# Patient Record
Sex: Female | Born: 1950 | Race: White | Hispanic: No | Marital: Married | Smoking: Former smoker
Health system: Southern US, Community
[De-identification: ages and names within clinical notes are randomized; demographics above are authoritative.]

## PROBLEM LIST (undated history)

## (undated) DIAGNOSIS — G8929 Other chronic pain: Secondary | ICD-10-CM

## (undated) DIAGNOSIS — T451X5A Adverse effect of antineoplastic and immunosuppressive drugs, initial encounter: Secondary | ICD-10-CM

## (undated) DIAGNOSIS — H9192 Unspecified hearing loss, left ear: Secondary | ICD-10-CM

## (undated) DIAGNOSIS — C50912 Malignant neoplasm of unspecified site of left female breast: Secondary | ICD-10-CM

## (undated) DIAGNOSIS — Z9889 Other specified postprocedural states: Secondary | ICD-10-CM

## (undated) DIAGNOSIS — M545 Low back pain, unspecified: Secondary | ICD-10-CM

## (undated) DIAGNOSIS — R29810 Facial weakness: Secondary | ICD-10-CM

## (undated) DIAGNOSIS — Z9221 Personal history of antineoplastic chemotherapy: Secondary | ICD-10-CM

## (undated) DIAGNOSIS — F419 Anxiety disorder, unspecified: Secondary | ICD-10-CM

## (undated) DIAGNOSIS — M79672 Pain in left foot: Secondary | ICD-10-CM

## (undated) DIAGNOSIS — L658 Other specified nonscarring hair loss: Secondary | ICD-10-CM

## (undated) DIAGNOSIS — I10 Essential (primary) hypertension: Secondary | ICD-10-CM

## (undated) DIAGNOSIS — Z8719 Personal history of other diseases of the digestive system: Secondary | ICD-10-CM

## (undated) DIAGNOSIS — R112 Nausea with vomiting, unspecified: Secondary | ICD-10-CM

## (undated) DIAGNOSIS — D320 Benign neoplasm of cerebral meninges: Secondary | ICD-10-CM

## (undated) DIAGNOSIS — T7840XA Allergy, unspecified, initial encounter: Secondary | ICD-10-CM

## (undated) DIAGNOSIS — K219 Gastro-esophageal reflux disease without esophagitis: Secondary | ICD-10-CM

## (undated) DIAGNOSIS — N393 Stress incontinence (female) (male): Secondary | ICD-10-CM

## (undated) DIAGNOSIS — G43909 Migraine, unspecified, not intractable, without status migrainosus: Secondary | ICD-10-CM

## (undated) HISTORY — PX: UPPER GASTROINTESTINAL ENDOSCOPY: SHX188

## (undated) HISTORY — PX: FOOT FRACTURE SURGERY: SHX645

## (undated) HISTORY — PX: COLONOSCOPY: SHX174

## (undated) HISTORY — PX: PARTIAL KNEE ARTHROPLASTY: SHX2174

## (undated) HISTORY — PX: WISDOM TOOTH EXTRACTION: SHX21

## (undated) HISTORY — PX: MULTIPLE TOOTH EXTRACTIONS: SHX2053

## (undated) HISTORY — DX: Benign neoplasm of cerebral meninges: D32.0

## (undated) HISTORY — PX: FOOT SURGERY: SHX648

## (undated) HISTORY — PX: JOINT REPLACEMENT: SHX530

## (undated) HISTORY — PX: BACK SURGERY: SHX140

## (undated) HISTORY — DX: Allergy, unspecified, initial encounter: T78.40XA

## (undated) HISTORY — PX: FRACTURE SURGERY: SHX138

## (undated) HISTORY — PX: KNEE ARTHROSCOPY: SUR90

## (undated) HISTORY — DX: Anxiety disorder, unspecified: F41.9

---

## 1976-12-10 HISTORY — PX: TOTAL ABDOMINAL HYSTERECTOMY: SHX209

## 1988-02-10 HISTORY — PX: POSTERIOR LUMBAR FUSION: SHX6036

## 2000-01-25 ENCOUNTER — Encounter: Payer: Self-pay | Admitting: Family Medicine

## 2000-01-25 ENCOUNTER — Encounter: Admission: RE | Admit: 2000-01-25 | Discharge: 2000-01-25 | Payer: Self-pay | Admitting: Family Medicine

## 2000-11-24 ENCOUNTER — Ambulatory Visit (HOSPITAL_COMMUNITY): Admission: RE | Admit: 2000-11-24 | Discharge: 2000-11-24 | Payer: Self-pay | Admitting: Orthopedic Surgery

## 2002-02-24 ENCOUNTER — Encounter: Admission: RE | Admit: 2002-02-24 | Discharge: 2002-02-24 | Payer: Self-pay | Admitting: Orthopedic Surgery

## 2002-02-24 ENCOUNTER — Encounter: Payer: Self-pay | Admitting: Orthopedic Surgery

## 2002-06-09 ENCOUNTER — Encounter: Payer: Self-pay | Admitting: Gastroenterology

## 2002-06-09 ENCOUNTER — Ambulatory Visit (HOSPITAL_COMMUNITY): Admission: RE | Admit: 2002-06-09 | Discharge: 2002-06-09 | Payer: Self-pay | Admitting: Gastroenterology

## 2002-08-09 ENCOUNTER — Encounter: Payer: Self-pay | Admitting: Orthopedic Surgery

## 2002-08-10 ENCOUNTER — Ambulatory Visit (HOSPITAL_COMMUNITY): Admission: RE | Admit: 2002-08-10 | Discharge: 2002-08-11 | Payer: Self-pay | Admitting: Orthopedic Surgery

## 2002-08-10 ENCOUNTER — Encounter: Payer: Self-pay | Admitting: Orthopedic Surgery

## 2004-04-04 ENCOUNTER — Ambulatory Visit (HOSPITAL_COMMUNITY): Admission: RE | Admit: 2004-04-04 | Discharge: 2004-04-04 | Payer: Self-pay | Admitting: Gastroenterology

## 2004-04-04 ENCOUNTER — Encounter (INDEPENDENT_AMBULATORY_CARE_PROVIDER_SITE_OTHER): Payer: Self-pay | Admitting: Specialist

## 2004-06-22 ENCOUNTER — Encounter: Admission: RE | Admit: 2004-06-22 | Discharge: 2004-06-22 | Payer: Self-pay | Admitting: Family Medicine

## 2006-05-23 ENCOUNTER — Encounter: Admission: RE | Admit: 2006-05-23 | Discharge: 2006-05-23 | Payer: Self-pay | Admitting: Orthopedic Surgery

## 2006-06-18 ENCOUNTER — Encounter: Admission: RE | Admit: 2006-06-18 | Discharge: 2006-06-18 | Payer: Self-pay | Admitting: Orthopedic Surgery

## 2006-07-08 ENCOUNTER — Encounter: Admission: RE | Admit: 2006-07-08 | Discharge: 2006-07-08 | Payer: Self-pay | Admitting: Orthopedic Surgery

## 2006-07-25 ENCOUNTER — Encounter: Admission: RE | Admit: 2006-07-25 | Discharge: 2006-07-25 | Payer: Self-pay | Admitting: Orthopedic Surgery

## 2006-12-30 ENCOUNTER — Encounter: Admission: RE | Admit: 2006-12-30 | Discharge: 2006-12-30 | Payer: Self-pay | Admitting: Specialist

## 2008-08-30 IMAGING — CT CT L SPINE W/ CM
3 of 8 series · 15 of 33 positions shown, 18 images · IV contrast (omnipaque)
Comparison: none

CLINICAL DATA: Remote posterior fusion for L2 traumatic fracture. Right low back
pain and right lower extremity pain.

LUMBAR MYELOGRAM:
CT LUMBAR SPINE WITH INTRATHECAL CONTRAST:
TECHNIQUE: An appropriate entry site was determined under fluoroscopy. Skin site
was marked, prepped with Betadine, and draped in usual sterile fashion, and
infiltrated locally with 1% lidocaine. A 22 gauge spinal needle was  advanced
into the thecal sac at L4-L5 from a right interlaminar approach . Clear
colorless CSF returned. 17 ml Omnipaque 180 were administered intrathecally for
lumbar myelography, followed by axial CT scanning of the lumbar spine. Coronal
and sagittal reconstructions were generated from the axial scan.

[Series 4: recon 3: l-spine helical · axial · 0.27mm/px · z∈[-42,+147]mm · 7 of 203 slices shown, 9 images]
[im 26/203  soft-tissue]
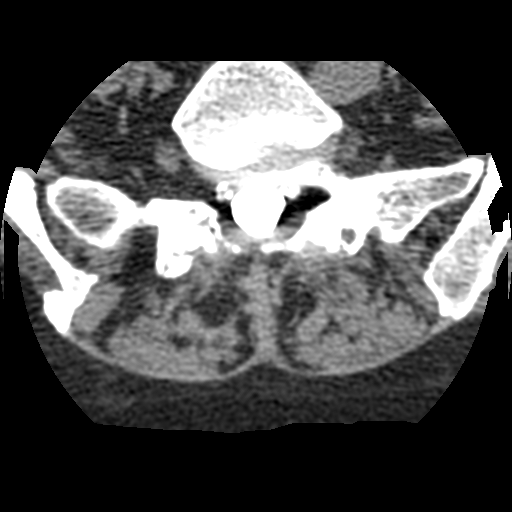
[im 26/203  bone]
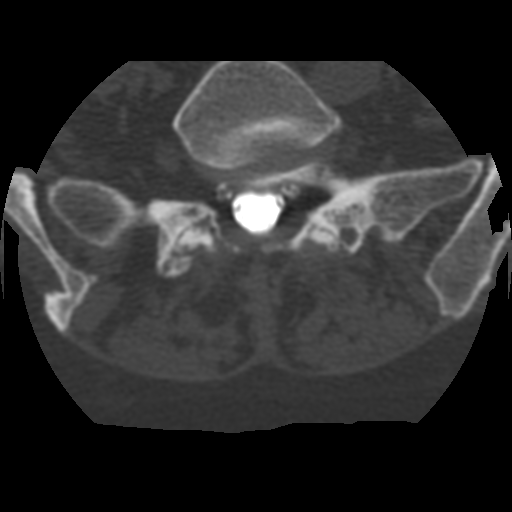
[im 51/203  bone]
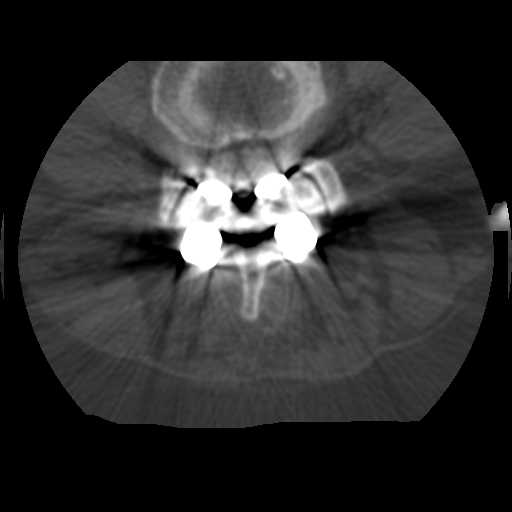
[im 76/203  bone]
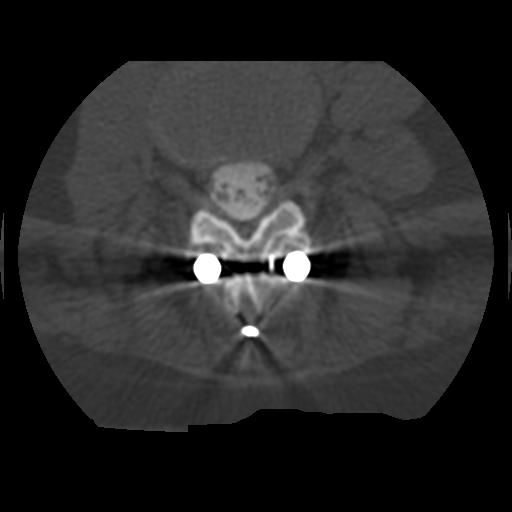
[im 102/203  bone]
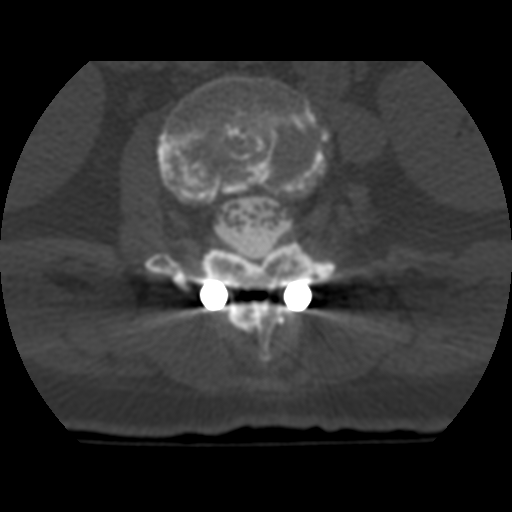
[im 127/203  soft-tissue]
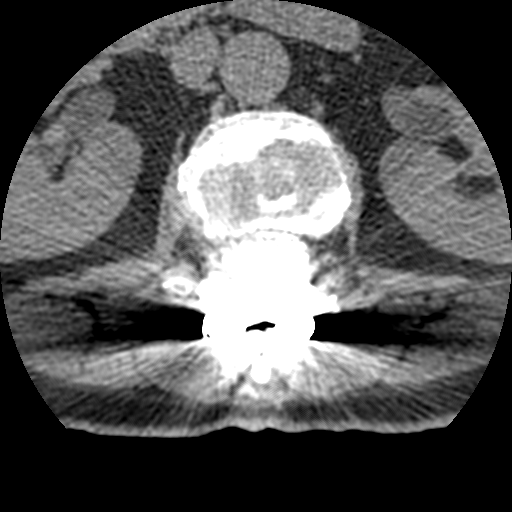
[im 127/203  bone]
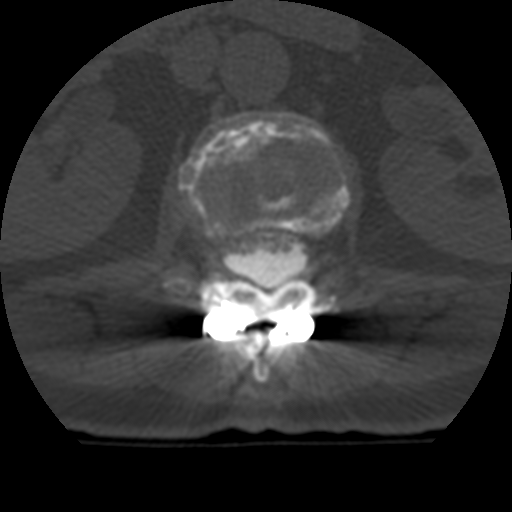
[im 152/203  bone]
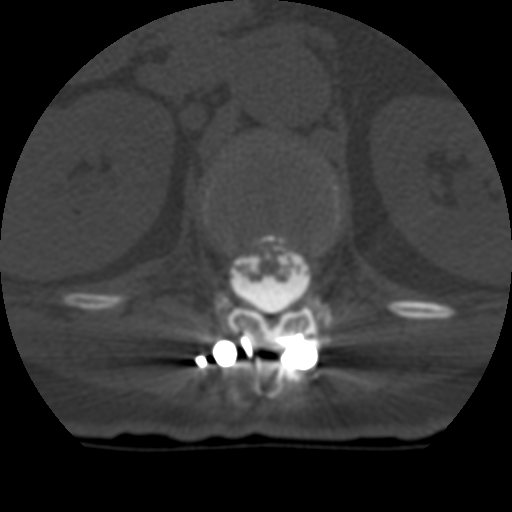
[im 177/203  bone]
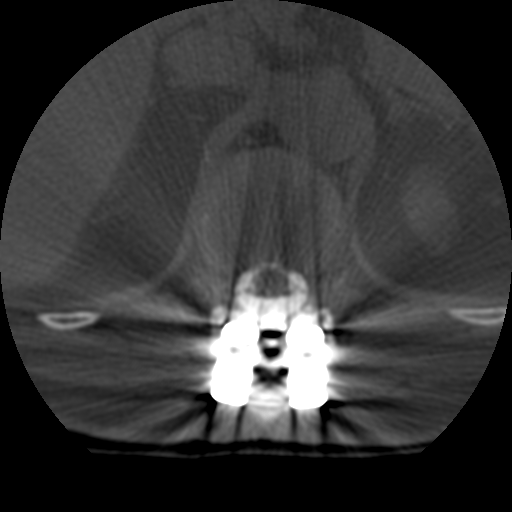

[Series 400: reformatted · sagittal · 0.51mm/px · 5 of 40 slices shown, 6 images (1 of 2)]
[im 14/40  bone]
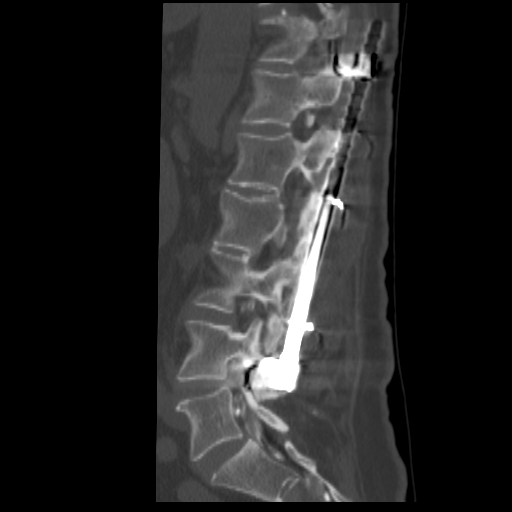
[im 17/40  bone]
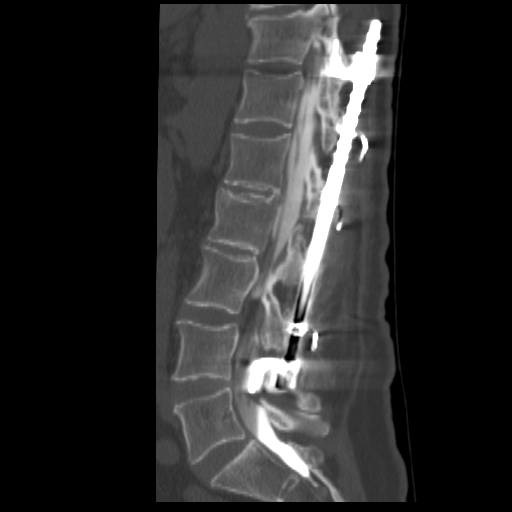
[im 20/40  soft-tissue]
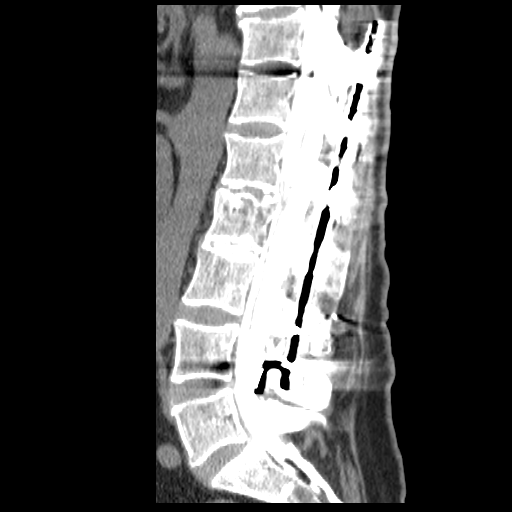
[im 20/40  bone]
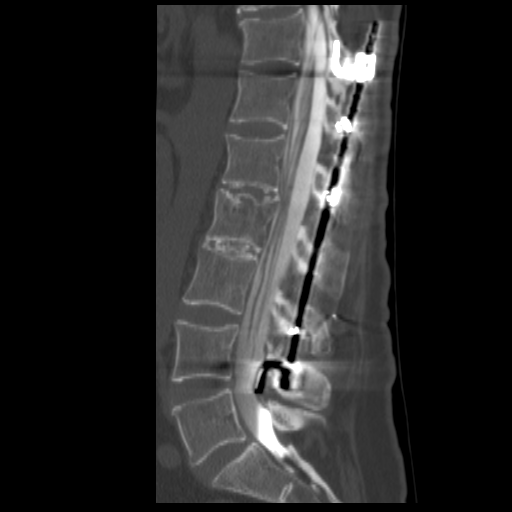
[im 23/40  bone]
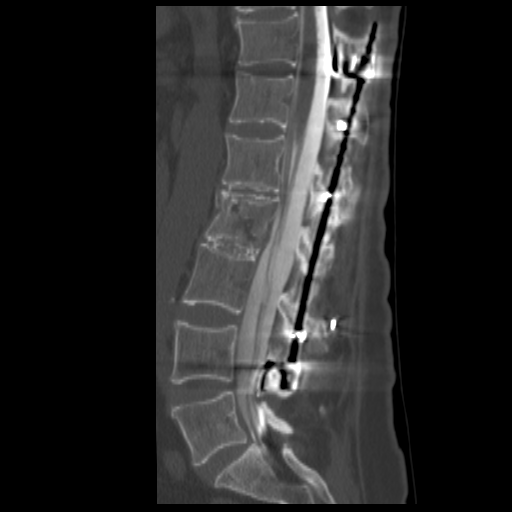
[im 27/40  bone]
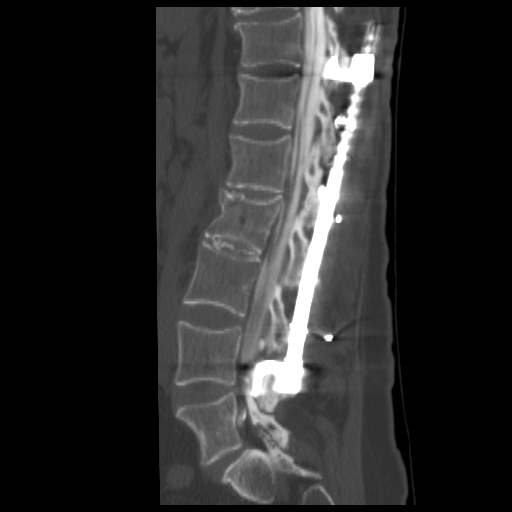

[Series 401: reformatted · coronal · 0.51mm/px · 3 of 40 slices shown (2 of 2)]
[im 8/40  bone]
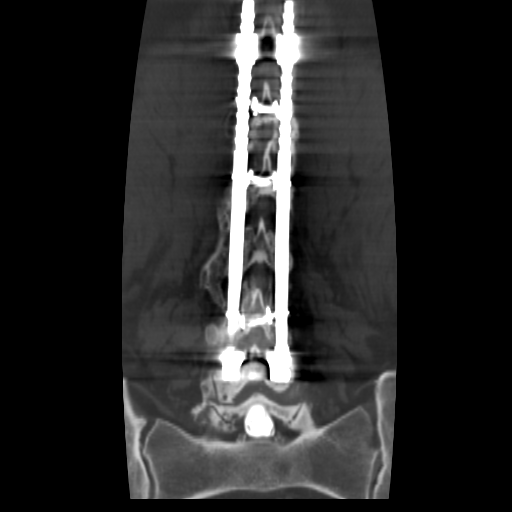
[im 16/40  bone]
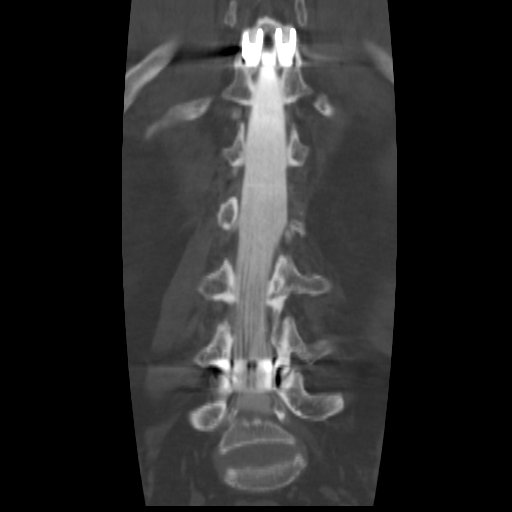
[im 24/40  bone]
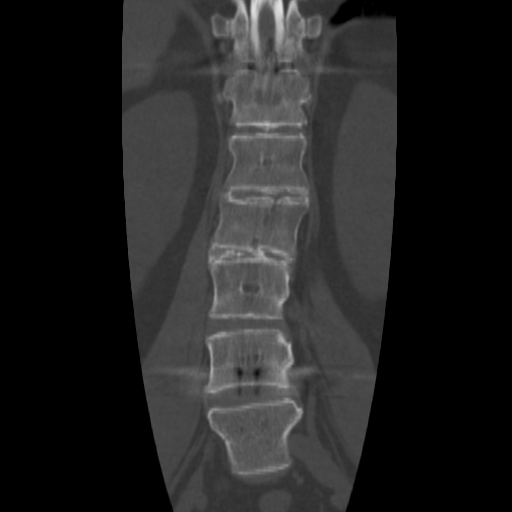

[15 of 33 positions shown; findings below may reference images not displayed]

FINDINGS: Normal alignment. Harrington rods extend from T11 to L4 and remain
intact. Lateral flexion-extension films show no dynamic instability.

T11-T12: Posterior fusion with laminar hooks intact. No spinal or foraminal
stenosis.

T12-L1: Normal conus behind L1. Posterior fusion hardware intact.

L1-L2: Posterior fusion hardware intact. No stenosis. Old burst fracture of L2.
Narrow interspace.

L2-L3: Solid-appearing bone bridging across interspace. Posterior hardware
intact with fusion across the posterior elements. No spinal or foraminal
stenosis.

L3-L4: Posterior fusion hardware intact. No  spinal or foraminal stenosis.
Interspace unremarkable.

L4-L5: Bilateral laminar hooks at L4 intact. These result in some encroachment
on the posterior aspect of thecal sac and contribute to mild   narrowing in the
anteroposterior dimension. Lateral   and subarticular recesses remain patent. No
significant disc bulge or protrusion. Foramina remain widely patent. Mild
degenerative changes in the facet joints bilaterally without significant
hypertrophy.

L5-S1: Moderately advanced bilateral facet degenerative hypertrophy. Interspace
is unremarkable. Neural foramina widely patent.
IMPRESSION: 1. Posterior spinal fusion T11-L4 without apparent complication.
2. Bilateral facet degenerative hypertrophy, mild L4-L5, more advanced L5-S1.

## 2009-05-24 ENCOUNTER — Inpatient Hospital Stay (HOSPITAL_COMMUNITY): Admission: RE | Admit: 2009-05-24 | Discharge: 2009-05-27 | Payer: Self-pay | Admitting: Orthopedic Surgery

## 2009-05-24 HISTORY — PX: TOTAL KNEE ARTHROPLASTY: SHX125

## 2009-12-19 ENCOUNTER — Encounter: Admission: RE | Admit: 2009-12-19 | Discharge: 2009-12-19 | Payer: Self-pay | Admitting: Internal Medicine

## 2010-11-15 LAB — PROTIME-INR
INR: 0.98 (ref 0.00–1.49)
INR: 1.1 (ref 0.00–1.49)
Prothrombin Time: 13.8 seconds (ref 11.6–15.2)

## 2010-11-15 LAB — BASIC METABOLIC PANEL
BUN: 2 mg/dL — ABNORMAL LOW (ref 6–23)
Calcium: 8.3 mg/dL — ABNORMAL LOW (ref 8.4–10.5)
Calcium: 8 mg/dL — ABNORMAL LOW (ref 8.4–10.5)
Chloride: 102 mEq/L (ref 96–112)
Creatinine, Ser: 0.42 mg/dL (ref 0.4–1.2)
Creatinine, Ser: 0.55 mg/dL (ref 0.4–1.2)
GFR calc Af Amer: >60 mL/min (ref >60)
GFR calc Af Amer: >60 mL/min (ref >60)
GFR calc Af Amer: >60 mL/min (ref >60)
GFR calc non Af Amer: >60 mL/min (ref >60)
GFR calc non Af Amer: >60 mL/min (ref >60)
GFR calc non Af Amer: >60 mL/min (ref >60)
Potassium: 3.4 mEq/L — ABNORMAL LOW (ref 3.5–5.1)
Potassium: 3.6 mEq/L (ref 3.5–5.1)
Sodium: 134 mEq/L — ABNORMAL LOW (ref 135–145)

## 2010-11-15 LAB — CBC
HCT: 30.3 % — ABNORMAL LOW (ref 36.0–46.0)
HCT: 38.7 % (ref 36.0–46.0)
Hemoglobin: 10.4 g/dL — ABNORMAL LOW (ref 12.0–15.0)
MCHC: 34.2 g/dL (ref 30.0–36.0)
MCV: 89 fL (ref 78.0–100.0)
MCV: 89.2 fL (ref 78.0–100.0)
Platelets: 354 10*3/uL (ref 150–400)
Platelets: 396 10*3/uL (ref 150–400)
Platelets: 484 10*3/uL — ABNORMAL HIGH (ref 150–400)
RBC: 3.34 MIL/uL — ABNORMAL LOW (ref 3.87–5.11)
RBC: 3.38 MIL/uL — ABNORMAL LOW (ref 3.87–5.11)
RBC: 3.66 MIL/uL — ABNORMAL LOW (ref 3.87–5.11)
RDW: 12 % (ref 11.5–15.5)
WBC: 9.7 10*3/uL (ref 4.0–10.5)
WBC: 10.3 10*3/uL (ref 4.0–10.5)
WBC: 11.8 10*3/uL — ABNORMAL HIGH (ref 4.0–10.5)

## 2010-11-15 LAB — COMPREHENSIVE METABOLIC PANEL
AST: 16 U/L (ref 0–37)
Albumin: 4.1 g/dL (ref 3.5–5.2)
BUN: 6 mg/dL (ref 6–23)
Calcium: 9.3 mg/dL (ref 8.4–10.5)
Creatinine, Ser: 0.59 mg/dL (ref 0.4–1.2)
GFR calc Af Amer: >60 mL/min (ref >60)
GFR calc non Af Amer: >60 mL/min (ref >60)
Total Bilirubin: 0.5 mg/dL (ref 0.3–1.2)

## 2010-11-15 LAB — APTT: aPTT: 25 seconds (ref 24–37)

## 2010-12-28 NOTE — Op Note (Signed)
NAME:  KRINA, MRAZ                     ACCOUNT NO.:  [id number]   MEDICAL RECORD NO.:  [id number]                   PATIENT TYPE:  AMB   LOCATION:  ENDO                                 FACILITY:  [facility]   PHYSICIAN:  Graylin Shiver, M.D.                DATE OF BIRTH:  March 31, 1951   DATE OF PROCEDURE:  04/04/2004  DATE OF DISCHARGE:                                 OPERATIVE REPORT   PROCEDURE PERFORMED:  Colonoscopy.   INDICATIONS FOR PROCEDURE:  Screening.   Informed consent was obtained after explanation of the risks of bleeding,  infection, and perforation.   PREMEDICATIONS:  The procedure was done after an esophagogastroduodenoscopy  with an additional 25 mcg of fentanyl and 4 mg of Versed given.   DESCRIPTION OF PROCEDURE:  With the patient in the left lateral decubitus  position, a rectal exam was performed and no masses were felt.  The Olympus  colonoscope was inserted into the rectum and advanced around the colon to  the cecum.  Cecal landmarks were identified.  The cecum and ascending colon  were normal.  The transverse colon normal.  The descending colon and sigmoid  showed some diverticula.  In the sigmoid, there was a small 3 mm polyp  biopsied off with cold forceps.  The rectum was normal.  The patient  tolerated the procedure well without complications.   IMPRESSION:  1. Diverticulosis of the left colon.  2. Small sigmoid polyp, 2113.   PLAN:  The pathology will be checked.                                               Graylin Shiver, M.D.    Germain Osgood  D:  04/04/2004  T:  04/04/2004  Job:  [id number]

## 2010-12-28 NOTE — Op Note (Signed)
NAME:  Amy [name], Amy [name]                     ACCOUNT NO.:  [id number]   MEDICAL RECORD NO.:  [id number]                   PATIENT TYPE:  OIB   LOCATION:  2550                                 FACILITY:  [facility]   PHYSICIAN:  Nadara Mustard, M.D.                DATE OF BIRTH:  [birth year]   DATE OF PROCEDURE:  08/10/2002  DATE OF DISCHARGE:                                 OPERATIVE REPORT   PREOPERATIVE DIAGNOSIS:  Osteoarthritis of the left knee, medial joint line.   POSTOPERATIVE DIAGNOSIS:  Osteoarthritis of the left knee, medial joint  line.   OPERATION PERFORMED:  Left knee hemiarthroplasty with the Osteonics  components, EIUS extra small femoral component and extra small 8 mm tibial  component.   SURGEON:  Nadara Mustard, M.D.   ANESTHESIA:  General.   ESTIMATED BLOOD LOSS:  Minimal.   ANTIBIOTICS:  1 gm of Kefzol.   TOURNIQUET TIME:  58 minutes at 350 mmHg.   DISPOSITION:  To PACU in stable condition.   INDICATIONS FOR PROCEDURE:  The patient is a 60 year old woman with  significant medial joint line osteoarthritis.  She has failed conservative  care and has been unable to perform activities of daily living due to her  medial joint line pain and presents at this time for a unicompartmental  arthroplasty.  The risks and benefits were discussed including infection,  neurovascular injury, persistent pain, need for additional surgery.  The  patient states she understands and wishes to proceed at this time.   DESCRIPTION OF PROCEDURE:  The patient was brought to operating room 15 and  underwent a general anesthetic.  After an adequate level of anesthesia was  obtained, the patient's left lower extremity was prepped using DuraPrep and  draped into a sterile field.  The leg was elevated and the tourniquet  inflated to 350 mmHg.  A three-inch medial parapatellar incision was made  and this was carried down through the retinaculum.  The fat pad was excised.  The retractors  were placed.  A partial patellectomy was performed.  Attention was then focused on the tibia.  The external alignment guide was  placed and this was set to take 6 mm and the alignment was set with the  posterior slope equal to the posterior slope of the medial tibial plateau.  Then alignment was straight down the shaft of the tibia.  The tibial cut was  made.  8 mm spacer block was placed in flexion and extension and this fit  well.  Attention was then focused on the femur.  The femoral block was then  placed and pinned.  The keel cut was made in the cutting block and the  posterior condyle cut was made off the femur.  The router block was then  placed and the router cut was made for the femoral component.  The femoral  trial component was placed as well as the trial  tibial component.  The knee  was placed through a range of motion and the components had good fit with  the tongue depressor blade fit in both flexion and extension.  The tibial  keel cut was then placed with the alignment again down the first ray of the  foot.  The wound was then irrigated with pulse lavage.  The tibial and  femoral components were then cemented and held in about 30 degrees of  flexion with the tongue depressor blade between the two components.  Loose  cement was removed.  After the cement had hardened, the knee was again  placed through a full range of motion and had stable range of motion.  The  tourniquet was deflated after 58 minutes.  The retinaculum was closed using  a #1 Vicryl.  Subcutaneous tissue closed with 2-0 Vicryl.  Skin was closed  using Proximate staples.  The wound was covered with Adaptic orthopedic  sponges, Webril and a Coban dressing was applied.  The patient was extubated  and taken to PACU in stable condition.  Plan for 23 hour observation.                                                Nadara Mustard, M.D.    MVD/MEDQ  D:  08/10/2002  T:  08/10/2002  Job:  [id number]

## 2010-12-28 NOTE — Op Note (Signed)
NAME:  Amy [name], Amy [name]                     ACCOUNT NO.:  [id number]   MEDICAL RECORD NO.:  [id number]                   PATIENT TYPE:  AMB   LOCATION:  ENDO                                 FACILITY:  [facility]   PHYSICIAN:  Graylin Shiver, M.D.                DATE OF BIRTH:  02-28-1951   DATE OF PROCEDURE:  04/04/2004  DATE OF DISCHARGE:                                 OPERATIVE REPORT   PROCEDURE:  Esophagogastroduodenoscopy with biopsy for CLOtest.   SURGEON:   INDICATIONS FOR PROCEDURE:  Heartburn.   Informed consent was obtained after explanation of the risks of bleeding,  infection, and perforation.   PREMEDICATION:  Fentanyl 100 mcg IV, Versed 8 mg IV.   DESCRIPTION OF PROCEDURE:  With the patient in the left lateral decubitus  position, the Olympus gastroscope was inserted into the oropharynx and  passed into the esophagus.  It was advanced down the esophagus and into the  stomach and into the duodenum.  The second portion of bulb of the duodenum  looked normal.  The stomach showed a diffuse erythematous appearance to the  mucosa compatible with gastritis.  No ulcers or erosions were seen.  Biopsy  for CLOtest was obtained in the distal stomach.  No lesions were seen in the  fundus or cardia.  The esophagus looked normal.  She tolerated the procedure  well without complications.   IMPRESSION:  Gastritis.   PLAN:  CLOtest will be checked.                                               Graylin Shiver, M.D.    Germain Osgood  D:  04/04/2004  T:  04/04/2004  Job:  [id number]

## 2011-08-29 ENCOUNTER — Ambulatory Visit (INDEPENDENT_AMBULATORY_CARE_PROVIDER_SITE_OTHER): Payer: 59 | Admitting: Physician Assistant

## 2011-08-29 DIAGNOSIS — I10 Essential (primary) hypertension: Secondary | ICD-10-CM

## 2011-08-29 DIAGNOSIS — G4700 Insomnia, unspecified: Secondary | ICD-10-CM

## 2011-08-29 DIAGNOSIS — Z23 Encounter for immunization: Secondary | ICD-10-CM

## 2011-09-25 ENCOUNTER — Other Ambulatory Visit: Payer: Self-pay | Admitting: Physician Assistant

## 2011-09-27 ENCOUNTER — Other Ambulatory Visit: Payer: Self-pay | Admitting: Physician Assistant

## 2011-09-27 MED ORDER — ZOLPIDEM TARTRATE 10 MG PO TABS: 10.0000 mg | ORAL_TABLET | Freq: Every evening | ORAL | Status: DC | PRN

## 2011-10-10 ENCOUNTER — Other Ambulatory Visit: Payer: Self-pay

## 2011-10-10 MED ORDER — LANSOPRAZOLE 30 MG PO TBDP: 30.0000 mg | ORAL_TABLET | Freq: Every day | ORAL | Status: DC

## 2011-11-01 ENCOUNTER — Other Ambulatory Visit: Payer: Self-pay | Admitting: Physician Assistant

## 2011-11-05 ENCOUNTER — Other Ambulatory Visit: Payer: Self-pay | Admitting: Physician Assistant

## 2011-11-05 MED ORDER — LANSOPRAZOLE 30 MG PO TBDP: 30.0000 mg | ORAL_TABLET | Freq: Every day | ORAL | Status: DC

## 2011-11-24 ENCOUNTER — Telehealth: Payer: Self-pay

## 2011-11-24 MED ORDER — ZOLPIDEM TARTRATE 10 MG PO TABS: 10.0000 mg | ORAL_TABLET | Freq: Every evening | ORAL | Status: DC | PRN

## 2011-11-24 NOTE — Telephone Encounter (Signed)
ROBERT STATES THEY HAVE BEEN WAITING FOR PT'S AMBRIEN FOR SEVERAL DAYS NOW AND STILL IT HASN'T BEEN REFILLED PLEASE CALL [phone]   [facility] IN [place]

## 2011-11-24 NOTE — Telephone Encounter (Signed)
Pt chart at nurses station for review. MR [id number]

## 2011-11-25 NOTE — Telephone Encounter (Signed)
Rx faxed to [facility]/[place]. Robert notified

## 2011-12-05 ENCOUNTER — Other Ambulatory Visit: Payer: Self-pay | Admitting: Physician Assistant

## 2011-12-09 ENCOUNTER — Telehealth: Payer: Self-pay

## 2011-12-09 NOTE — Telephone Encounter (Signed)
CHELLE:   PT WOULD LIKE A RETURNED CALL ABOUT VERAPAMIL ER 180 SHE HAS APPT ON 01/28/12 AND NEEDS REFILLS UNTIL HER APPT

## 2011-12-10 MED ORDER — VERAPAMIL HCL ER 180 MG PO CP24: 180.0000 mg | ORAL_CAPSULE | Freq: Two times a day (BID) | ORAL | Status: DC

## 2011-12-10 NOTE — Telephone Encounter (Signed)
New Rx sent in

## 2011-12-10 NOTE — Telephone Encounter (Signed)
Patient saw you six months ago and made appt for 01/28/12.  Her rx for verapamil was for only 30 pills and she stated that she needs 60 per month.

## 2011-12-10 NOTE — Telephone Encounter (Signed)
She takes 1BID.

## 2011-12-10 NOTE — Telephone Encounter (Signed)
Please clarify dose (2 QD or 1 BID) and ok to refill through June.

## 2012-01-17 ENCOUNTER — Other Ambulatory Visit (HOSPITAL_COMMUNITY): Payer: Self-pay | Admitting: Orthopedic Surgery

## 2012-01-17 DIAGNOSIS — M25561 Pain in right knee: Secondary | ICD-10-CM

## 2012-01-28 ENCOUNTER — Telehealth: Payer: Self-pay

## 2012-01-28 ENCOUNTER — Ambulatory Visit (INDEPENDENT_AMBULATORY_CARE_PROVIDER_SITE_OTHER): Payer: 59 | Admitting: Physician Assistant

## 2012-01-28 ENCOUNTER — Encounter: Payer: Self-pay | Admitting: Physician Assistant

## 2012-01-28 VITALS — BP 140/90 | HR 81 | Temp 98.2°F | Resp 16 | Ht 65.75 in | Wt 160.0 lb

## 2012-01-28 DIAGNOSIS — Z0000 Encounter for general adult medical examination without abnormal findings: Secondary | ICD-10-CM

## 2012-01-28 DIAGNOSIS — Z79890 Hormone replacement therapy (postmenopausal): Secondary | ICD-10-CM

## 2012-01-28 DIAGNOSIS — F419 Anxiety disorder, unspecified: Secondary | ICD-10-CM

## 2012-01-28 DIAGNOSIS — F341 Dysthymic disorder: Secondary | ICD-10-CM

## 2012-01-28 DIAGNOSIS — E785 Hyperlipidemia, unspecified: Secondary | ICD-10-CM

## 2012-01-28 DIAGNOSIS — K2990 Gastroduodenitis, unspecified, without bleeding: Secondary | ICD-10-CM

## 2012-01-28 DIAGNOSIS — I10 Essential (primary) hypertension: Secondary | ICD-10-CM

## 2012-01-28 DIAGNOSIS — G4700 Insomnia, unspecified: Secondary | ICD-10-CM

## 2012-01-28 DIAGNOSIS — K2970 Gastritis, unspecified, without bleeding: Secondary | ICD-10-CM

## 2012-01-28 DIAGNOSIS — F329 Major depressive disorder, single episode, unspecified: Secondary | ICD-10-CM

## 2012-01-28 LAB — POCT UA - MICROSCOPIC ONLY
Casts, Ur, LPF, POC: NEGATIVE
Crystals, Ur, HPF, POC: NEGATIVE
Mucus, UA: NEGATIVE
Yeast, UA: NEGATIVE

## 2012-01-28 LAB — COMPREHENSIVE METABOLIC PANEL
AST: 16 U/L (ref 0–37)
BUN: 8 mg/dL (ref 6–23)
Calcium: 9.1 mg/dL (ref 8.4–10.5)
Chloride: 104 mEq/L (ref 96–112)
Creat: 0.57 mg/dL (ref 0.50–1.10)

## 2012-01-28 LAB — TSH: TSH: 1.514 u[IU]/mL (ref 0.350–4.500)

## 2012-01-28 LAB — CBC WITH DIFFERENTIAL/PLATELET
Basophils Absolute: 0 10*3/uL (ref 0.0–0.1)
HCT: 37.6 % (ref 36.0–46.0)
Hemoglobin: 12.7 g/dL (ref 12.0–15.0)
Lymphocytes Relative: 48 % — ABNORMAL HIGH (ref 12–46)
Monocytes Absolute: 0.6 10*3/uL (ref 0.1–1.0)
Neutro Abs: 2.4 10*3/uL (ref 1.7–7.7)
RDW: 13.2 % (ref 11.5–15.5)
WBC: 6.2 10*3/uL (ref 4.0–10.5)

## 2012-01-28 LAB — POCT URINALYSIS DIPSTICK
Blood, UA: NEGATIVE
Nitrite, UA: NEGATIVE
Protein, UA: NEGATIVE
Urobilinogen, UA: 0.2
pH, UA: 7

## 2012-01-28 LAB — LIPID PANEL
Cholesterol: 195 mg/dL (ref 0–200)
HDL: 46 mg/dL (ref >39)
Total CHOL/HDL Ratio: 4.2 Ratio
Triglycerides: 177 mg/dL — ABNORMAL HIGH (ref <150)

## 2012-01-28 MED ORDER — LANSOPRAZOLE 30 MG PO TBDP: 30.0000 mg | ORAL_TABLET | Freq: Every day | ORAL | Status: DC

## 2012-01-28 MED ORDER — LISINOPRIL 5 MG PO TABS: 5.0000 mg | ORAL_TABLET | Freq: Every day | ORAL | Status: DC

## 2012-01-28 MED ORDER — ESTROGENS CONJUGATED 1.25 MG PO TABS: 1.2500 mg | ORAL_TABLET | Freq: Every day | ORAL | Status: DC

## 2012-01-28 MED ORDER — ZOLPIDEM TARTRATE 10 MG PO TABS: 10.0000 mg | ORAL_TABLET | Freq: Every evening | ORAL | Status: DC | PRN

## 2012-01-28 MED ORDER — METOPROLOL TARTRATE 100 MG PO TABS: 100.0000 mg | ORAL_TABLET | Freq: Two times a day (BID) | ORAL | Status: DC

## 2012-01-28 MED ORDER — CITALOPRAM HYDROBROMIDE 40 MG PO TABS: 40.0000 mg | ORAL_TABLET | Freq: Every day | ORAL | Status: DC

## 2012-01-28 MED ORDER — VERAPAMIL HCL ER 180 MG PO CP24: 180.0000 mg | ORAL_CAPSULE | Freq: Two times a day (BID) | ORAL | Status: DC

## 2012-01-28 NOTE — Telephone Encounter (Signed)
PATIENT JUST SAW CHELLE.  SAYS THE MEDICINE SHE REFILLED FOR HER WAS CHANGED FROM ONE A DAY TO TWO A DAY.  SHE DID NOT KNOW IF THIS WAS AN ACCIDENT OR NOT.

## 2012-01-28 NOTE — Patient Instructions (Signed)

## 2012-01-28 NOTE — Progress Notes (Signed)
Subjective:    Patient ID: Amy [name], female    DOB: January 23, 1951, 61 y.o.   MRN: [id number]  HPI  Presents for CPE.  OVerall doing well.  Is having a knee scan done tomorrow to evaluate the possibility of failed hardware in her knee replacement.  She's having lots of knee pain, instability and decreased ROM.  Review of Systems  Constitutional: Negative.   HENT: Negative.   Eyes: Negative.   Respiratory: Negative.   Cardiovascular: Negative.   Gastrointestinal: Negative.   Genitourinary: Negative.   Musculoskeletal: Positive for joint swelling, arthralgias and gait problem.       Left knee  Skin: Negative.   Neurological: Negative.   Hematological: Negative.   Psychiatric/Behavioral: Negative.        Objective:   Physical Exam  Vitals reviewed. Constitutional: She is oriented to person, place, and time. Vital signs are normal. She appears well-developed and well-nourished. No distress.  HENT:  Head: Normocephalic and atraumatic.  Right Ear: Hearing, tympanic membrane, external ear and ear canal normal. No foreign bodies.  Left Ear: Hearing, tympanic membrane, external ear and ear canal normal. No foreign bodies.  Nose: Nose normal.  Mouth/Throat: Uvula is midline, oropharynx is clear and moist and mucous membranes are normal. No oral lesions. Normal dentition. No dental abscesses or uvula swelling. No oropharyngeal exudate.  Eyes: Conjunctivae and EOM are normal. Pupils are equal, round, and reactive to light. Right eye exhibits no discharge. Left eye exhibits no discharge. No scleral icterus.  Fundoscopic exam:      The right eye shows no arteriolar narrowing, no AV nicking, no exudate, no hemorrhage and no papilledema. The right eye shows red reflex.The right eye shows no venous pulsations.      The left eye shows no arteriolar narrowing, no AV nicking, no exudate, no hemorrhage and no papilledema. The left eye shows red reflex.The left eye shows no venous  pulsations. Neck: Trachea normal, normal range of motion and full passive range of motion without pain. Neck supple. No spinous process tenderness and no muscular tenderness present. No mass and no thyromegaly present.  Cardiovascular: Normal rate, regular rhythm, normal heart sounds, intact distal pulses and normal pulses.   Pulmonary/Chest: Effort normal and breath sounds normal. She exhibits no tenderness and no retraction. Right breast exhibits no inverted nipple, no mass, no nipple discharge, no skin change and no tenderness. Left breast exhibits no inverted nipple, no mass, no nipple discharge, no skin change and no tenderness. Breasts are symmetrical.  Abdominal: Soft. Normal appearance and bowel sounds are normal. She exhibits no distension and no mass. There is no hepatosplenomegaly. There is no tenderness. There is no rigidity, no rebound, no guarding, no CVA tenderness, no tenderness at McBurney's point and negative Murphy's sign. No hernia. Hernia confirmed negative in the right inguinal area and confirmed negative in the left inguinal area.  Genitourinary: Rectum normal and vagina normal. Rectal exam shows no external hemorrhoid and no fissure. No breast swelling, tenderness, discharge or bleeding. Pelvic exam was performed with patient supine. No labial fusion. There is no rash, tenderness, lesion or injury on the right labia. There is no rash, tenderness, lesion or injury on the left labia. No erythema, tenderness or bleeding around the vagina. No foreign body around the vagina. No signs of injury around the vagina. No vaginal discharge found.       Cervix is surgically absent.  Normal bimanual exam.  Musculoskeletal: She exhibits no edema and no tenderness.  Cervical back: Normal.       Thoracic back: Normal.       Lumbar back: Normal.  Lymphadenopathy:       Head (right side): No tonsillar, no preauricular, no posterior auricular and no occipital adenopathy present.       Head  (left side): No tonsillar, no preauricular, no posterior auricular and no occipital adenopathy present.    She has no cervical adenopathy.    She has no axillary adenopathy.       Right: No inguinal and no supraclavicular adenopathy present.       Left: No inguinal and no supraclavicular adenopathy present.  Neurological: She is alert and oriented to person, place, and time. She has normal strength and normal reflexes. No cranial nerve deficit. She exhibits normal muscle tone. Coordination and gait normal.  Skin: Skin is warm, dry and intact. No rash noted. She is not diaphoretic. No cyanosis or erythema. Nails show no clubbing.  Psychiatric: She has a normal mood and affect. Her speech is normal and behavior is normal. Judgment and thought content normal.   Results for orders placed in visit on 01/28/12  POCT UA - MICROSCOPIC ONLY      Component Value Range   WBC, Ur, HPF, POC 0-1     RBC, urine, microscopic neg     Bacteria, U Microscopic neg     Mucus, UA neg     Epithelial cells, urine per micros 0-3     Crystals, Ur, HPF, POC neg     Casts, Ur, LPF, POC neg     Yeast, UA neg    POCT URINALYSIS DIPSTICK      Component Value Range   Color, UA yellow     Clarity, UA clear     Glucose, UA neg     Bilirubin, UA neg     Ketones, UA neg     Spec Grav, UA 1.015     Blood, UA neg     pH, UA 7.0     Protein, UA neg     Urobilinogen, UA 0.2     Nitrite, UA neg     Leukocytes, UA Negative    IFOBT (OCCULT BLOOD)      Component Value Range   IFOBT Negative            Assessment & Plan:   1. Routine general medical examination at a health care facility  CBC with Differential, POCT UA - Microscopic Only, POCT urinalysis dipstick, IFOBT POC (occult bld, rslt in office)  2. Hyperlipidemia  Comprehensive metabolic panel, Lipid panel  3. HTN (hypertension)  CBC with Differential, Comprehensive metabolic panel, TSH, verapamil (VERELAN PM) 180 MG 24 hr capsule, metoprolol (LOPRESSOR)  100 MG tablet, lisinopril (PRINIVIL,ZESTRIL) 5 MG tablet  4. Gastritis  lansoprazole (PREVACID SOLUTAB) 30 MG disintegrating tablet  5. Insomnia  zolpidem (AMBIEN) 10 MG tablet  6. Anxiety and depression  citalopram (CELEXA) 40 MG tablet  7. Postmenopausal HRT (hormone replacement therapy)  estrogens, conjugated, (PREMARIN) 1.25 MG tablet Hope to decrease dose in the future.

## 2012-01-28 NOTE — Telephone Encounter (Signed)
Spoke with patient, she states that she has previously been on Metoprolol ER 100mg  daily, but today she was rx'd the plain Metoprolol 100mg  to take bid.  Was this intended?  To Chelle to advise.

## 2012-01-28 NOTE — Telephone Encounter (Signed)
Pt calling again about rx that was written for her by chelle

## 2012-01-29 ENCOUNTER — Encounter (HOSPITAL_COMMUNITY)
Admission: RE | Admit: 2012-01-29 | Discharge: 2012-01-29 | Disposition: A | Discharge status: Home / Self Care | Payer: 59 | Source: Ambulatory Visit | Attending: Orthopedic Surgery | Admitting: Orthopedic Surgery

## 2012-01-29 ENCOUNTER — Encounter: Payer: Self-pay | Admitting: Physician Assistant

## 2012-01-29 DIAGNOSIS — M25561 Pain in right knee: Secondary | ICD-10-CM

## 2012-01-29 DIAGNOSIS — M25569 Pain in unspecified knee: Secondary | ICD-10-CM | POA: Insufficient documentation

## 2012-01-29 MED ORDER — TECHNETIUM TC 99M MEDRONATE IV KIT
24.0000 | PACK | Freq: Once | INTRAVENOUS | Status: AC | PRN
  Administered 2012-01-29: 24 via INTRAVENOUS

## 2012-01-29 MED ORDER — METOPROLOL SUCCINATE ER 100 MG PO TB24: 100.0000 mg | ORAL_TABLET | Freq: Every day | ORAL | Status: DC

## 2012-01-29 NOTE — Telephone Encounter (Signed)
Patient notified and would like to continue on the ER 100mg  qd.  Corrected med sent to pharmacy.

## 2012-01-29 NOTE — Telephone Encounter (Signed)
No, not an intentional change. Looks like even though I reviewed her madications personally with her, we still got the wrong med on the list.  She can either take what I Rx'd, or or I can change it.  Her preference.  If she wants the metoprolol succinate 100 mg 1 PO QD, #30 RF x 5.

## 2012-02-05 ENCOUNTER — Other Ambulatory Visit: Payer: Self-pay | Admitting: Physician Assistant

## 2012-03-06 ENCOUNTER — Other Ambulatory Visit: Payer: Self-pay | Admitting: Physician Assistant

## 2012-03-16 ENCOUNTER — Telehealth: Payer: Self-pay

## 2012-03-16 NOTE — Telephone Encounter (Signed)
I called patient back, because phone mssg states she needs flexeril, but this med not in her list. She states she actually requests Xanax which she took 3 years ago, and requested a renewal on the Imitrex. She is requesting these be sent to [facility]. Chart at [facility] desk

## 2012-03-16 NOTE — Telephone Encounter (Signed)
Please get more information. Is she continuing to take her Celexa? Is she still taking Ambien?

## 2012-03-16 NOTE — Telephone Encounter (Signed)
Called patient, I do not see Flexeril in her records, she is requesting renewal on Flexeril and on Imitrex/ left message for her to call me back.

## 2012-03-16 NOTE — Telephone Encounter (Signed)
Pt was outside pt returning phone call [phone]

## 2012-03-16 NOTE — Telephone Encounter (Signed)
Pt would like a refill on imatrex and flexaril.  Best# [phone] Pharmacy: [facility] in [place]

## 2012-03-17 MED ORDER — ALPRAZOLAM 0.25 MG PO TABS: 0.2500 mg | ORAL_TABLET | Freq: Two times a day (BID) | ORAL | Status: AC | PRN

## 2012-03-17 MED ORDER — SUMATRIPTAN SUCCINATE 100 MG PO TABS: 100.0000 mg | ORAL_TABLET | ORAL | Status: DC | PRN

## 2012-03-17 NOTE — Telephone Encounter (Signed)
Called patient, [facility] [address] [place]

## 2012-03-17 NOTE — Telephone Encounter (Signed)
Yes, patient is taking Celexa 40mg  and is taking Ambien every night for sleep, she would like Xanax to take prn, she states last bottle lasted x74yrs. She mainly states she needs the imitrex to [facility] [place]

## 2012-03-17 NOTE — Telephone Encounter (Signed)
Both meds are ok to fill, i have peneded them. Please find out which [facility] in [place], there are two.

## 2012-03-17 NOTE — Addendum Note (Signed)
Addended byCaffie Damme on: 03/17/2012 03:40 PM   Modules accepted: Orders

## 2012-03-23 ENCOUNTER — Other Ambulatory Visit: Payer: Self-pay

## 2012-03-23 MED ORDER — ZOLPIDEM TARTRATE 10 MG PO TABS: 10.0000 mg | ORAL_TABLET | Freq: Every evening | ORAL | Status: DC | PRN

## 2012-03-23 NOTE — Telephone Encounter (Signed)
Pharmacy called and LM on nurse VM requesting RF of pt's Zolpidem. They have not filled this for pt before. She used to use [facility] but now wishes to get it from them. [facility]d Zolpidem for pt on 03/06/12 and it would not be due yet. Morrie Sheldon stated that she thinks the pt is aware of this because she asked them to call us and get it put on file for her for when it is due. Can we RF w/"can not fill until 04/06/12" note? If so, please [facility] [place].

## 2012-03-23 NOTE — Telephone Encounter (Signed)
At [facility] desk 

## 2012-03-23 NOTE — Telephone Encounter (Signed)
Called pharmacy to advise.

## 2012-05-01 ENCOUNTER — Other Ambulatory Visit: Payer: Self-pay | Admitting: *Deleted

## 2012-05-01 MED ORDER — ZOLPIDEM TARTRATE 10 MG PO TABS: 10.0000 mg | ORAL_TABLET | Freq: Every evening | ORAL | Status: DC | PRN

## 2012-05-18 ENCOUNTER — Encounter (HOSPITAL_COMMUNITY): Payer: Self-pay | Admitting: Pharmacy Technician

## 2012-05-21 ENCOUNTER — Other Ambulatory Visit (HOSPITAL_COMMUNITY): Payer: Self-pay | Admitting: Orthopedic Surgery

## 2012-05-21 ENCOUNTER — Encounter (HOSPITAL_COMMUNITY)
Admission: RE | Admit: 2012-05-21 | Discharge: 2012-05-21 | Disposition: A | Discharge status: Home / Self Care | Payer: 59 | Source: Ambulatory Visit | Attending: Orthopedic Surgery | Admitting: Orthopedic Surgery

## 2012-05-21 ENCOUNTER — Encounter (HOSPITAL_COMMUNITY): Payer: Self-pay

## 2012-05-21 HISTORY — DX: Other specified postprocedural states: Z98.890

## 2012-05-21 HISTORY — DX: Essential (primary) hypertension: I10

## 2012-05-21 HISTORY — DX: Other specified postprocedural states: R11.2

## 2012-05-21 HISTORY — DX: Gastro-esophageal reflux disease without esophagitis: K21.9

## 2012-05-21 LAB — COMPREHENSIVE METABOLIC PANEL
AST: 16 U/L (ref 0–37)
Albumin: 3.7 g/dL (ref 3.5–5.2)
BUN: 6 mg/dL (ref 6–23)
Calcium: 9.4 mg/dL (ref 8.4–10.5)
Creatinine, Ser: 0.6 mg/dL (ref 0.50–1.10)
Total Protein: 6.8 g/dL (ref 6.0–8.3)

## 2012-05-21 LAB — CBC
HCT: 38.9 % (ref 36.0–46.0)
MCHC: 33.7 g/dL (ref 30.0–36.0)
MCV: 85.9 fL (ref 78.0–100.0)
Platelets: 495 10*3/uL — ABNORMAL HIGH (ref 150–400)
RDW: 12.3 % (ref 11.5–15.5)

## 2012-05-21 LAB — SURGICAL PCR SCREEN: MRSA, PCR: NEGATIVE

## 2012-05-21 LAB — PROTIME-INR
INR: 1.05 (ref 0.00–1.49)
Prothrombin Time: 13.6 seconds (ref 11.6–15.2)

## 2012-05-21 LAB — APTT: aPTT: 25 seconds (ref 24–37)

## 2012-05-21 NOTE — Pre-Procedure Instructions (Signed)
20 DEVINA BEZOLD  05/21/2012   Your procedure is scheduled on:  Wednesday May 27, 2012 at 0830 AM  Report to [facility] at 0630 AM.  Call this number if you have problems the morning of surgery: [phone]   Remember:   Do not eat food or drink:After Midnight.Tuesday      Take these medicines the morning of surgery with A SIP OF WATER: Celexa[Citalopram], Premarin, Hydrocodone-Acetaminophen[NORCO] if needed , Prevacid, Metoprolol[Toprol-XL] and Verapamil [ Verelan PM.   Do not wear jewelry, make-up or nail polish.  Do not wear lotions, powders, or perfumes. You may wear deodorant.  Do not shave 48 hours prior to surgery.   Do not bring valuables to the hospital.  Contacts, dentures or bridgework may not be worn into surgery.  Leave suitcase in the car. After surgery it may be brought to your room.  For patients admitted to the hospital, checkout time is 11:00 AM the day of discharge.   Patients discharged the day of surgery will not be allowed to drive home.    Special Instructions: Shower using CHG 2 nights before surgery and the night before surgery.  If you shower the day of surgery use CHG.  Use special wash - you have one bottle of CHG for all showers.  You should use approximately 1/3 of the bottle for each shower.   Please read over the following fact sheets that you were given: Pain Booklet, Coughing and Deep Breathing, MRSA Information and Surgical Site Infection Prevention

## 2012-05-21 NOTE — Progress Notes (Signed)
Dr Audrie Lia office made aware that orders is needed for surgery, her appt is at 1000.

## 2012-05-26 MED ORDER — CEFAZOLIN SODIUM-DEXTROSE 2-3 GM-% IV SOLR
2.0000 g | INTRAVENOUS | Status: AC
  Administered 2012-05-27: 2 g via INTRAVENOUS
  Filled 2012-05-26: qty 50

## 2012-05-27 ENCOUNTER — Encounter (HOSPITAL_COMMUNITY): Payer: Self-pay | Admitting: Anesthesiology

## 2012-05-27 ENCOUNTER — Ambulatory Visit (HOSPITAL_COMMUNITY): Payer: 59 | Admitting: Anesthesiology

## 2012-05-27 ENCOUNTER — Encounter (HOSPITAL_COMMUNITY): Payer: Self-pay | Admitting: *Deleted

## 2012-05-27 ENCOUNTER — Encounter (HOSPITAL_COMMUNITY)
Admission: RE | Disposition: A | Discharge status: Home / Self Care | Payer: Self-pay | Source: Ambulatory Visit | Attending: Orthopedic Surgery

## 2012-05-27 ENCOUNTER — Observation Stay (HOSPITAL_COMMUNITY)
Admission: RE | Admit: 2012-05-27 | Discharge: 2012-05-29 | DRG: 502 | Disposition: A | Discharge status: Home / Self Care | Payer: 59 | Source: Ambulatory Visit | Attending: Orthopedic Surgery | Admitting: Orthopedic Surgery

## 2012-05-27 DIAGNOSIS — M66269 Spontaneous rupture of extensor tendons, unspecified lower leg: Principal | ICD-10-CM | POA: Insufficient documentation

## 2012-05-27 DIAGNOSIS — Z79890 Hormone replacement therapy (postmenopausal): Secondary | ICD-10-CM

## 2012-05-27 DIAGNOSIS — Z01810 Encounter for preprocedural cardiovascular examination: Secondary | ICD-10-CM | POA: Insufficient documentation

## 2012-05-27 DIAGNOSIS — Z23 Encounter for immunization: Secondary | ICD-10-CM | POA: Insufficient documentation

## 2012-05-27 DIAGNOSIS — I10 Essential (primary) hypertension: Secondary | ICD-10-CM

## 2012-05-27 DIAGNOSIS — K2970 Gastritis, unspecified, without bleeding: Secondary | ICD-10-CM

## 2012-05-27 DIAGNOSIS — F419 Anxiety disorder, unspecified: Secondary | ICD-10-CM

## 2012-05-27 DIAGNOSIS — Z96659 Presence of unspecified artificial knee joint: Secondary | ICD-10-CM | POA: Insufficient documentation

## 2012-05-27 DIAGNOSIS — Z01818 Encounter for other preprocedural examination: Secondary | ICD-10-CM | POA: Insufficient documentation

## 2012-05-27 DIAGNOSIS — S86819A Strain of other muscle(s) and tendon(s) at lower leg level, unspecified leg, initial encounter: Secondary | ICD-10-CM

## 2012-05-27 DIAGNOSIS — F329 Major depressive disorder, single episode, unspecified: Secondary | ICD-10-CM

## 2012-05-27 DIAGNOSIS — Z01812 Encounter for preprocedural laboratory examination: Secondary | ICD-10-CM | POA: Insufficient documentation

## 2012-05-27 HISTORY — PX: PATELLAR TENDON REPAIR: SHX737

## 2012-05-27 SURGERY — REPAIR, TENDON, PATELLAR
Anesthesia: General | Site: Knee | Laterality: Right | Wound class: Clean

## 2012-05-27 MED ORDER — WARFARIN SODIUM 7.5 MG PO TABS
7.5000 mg | ORAL_TABLET | Freq: Once | ORAL | Status: AC
  Administered 2012-05-27: 7.5 mg via ORAL
  Filled 2012-05-27: qty 1

## 2012-05-27 MED ORDER — METOPROLOL SUCCINATE ER 100 MG PO TB24
100.0000 mg | ORAL_TABLET | Freq: Every day | ORAL | Status: DC
  Administered 2012-05-28 – 2012-05-29 (×2): 100 mg via ORAL
  Filled 2012-05-27 (×2): qty 1

## 2012-05-27 MED ORDER — PANTOPRAZOLE SODIUM 40 MG PO TBEC
40.0000 mg | DELAYED_RELEASE_TABLET | Freq: Every day | ORAL | Status: DC
  Administered 2012-05-28 – 2012-05-29 (×2): 40 mg via ORAL
  Filled 2012-05-27 (×2): qty 1

## 2012-05-27 MED ORDER — ONDANSETRON HCL 4 MG/2ML IJ SOLN: 4.0000 mg | Freq: Once | INTRAMUSCULAR | Status: DC | PRN

## 2012-05-27 MED ORDER — ONDANSETRON HCL 4 MG PO TABS: 4.0000 mg | ORAL_TABLET | Freq: Four times a day (QID) | ORAL | Status: DC | PRN

## 2012-05-27 MED ORDER — OXYCODONE-ACETAMINOPHEN 5-325 MG PO TABS
1.0000 | ORAL_TABLET | ORAL | Status: DC | PRN
  Administered 2012-05-27: 2 via ORAL
  Administered 2012-05-27 (×2): 1 via ORAL
  Administered 2012-05-28 – 2012-05-29 (×7): 2 via ORAL
  Filled 2012-05-27 (×6): qty 2
  Filled 2012-05-27 (×2): qty 1
  Filled 2012-05-27 (×2): qty 2

## 2012-05-27 MED ORDER — WARFARIN - PHARMACIST DOSING INPATIENT: Freq: Every day | Status: DC

## 2012-05-27 MED ORDER — HYDROCODONE-ACETAMINOPHEN 5-325 MG PO TABS: 1.0000 | ORAL_TABLET | ORAL | Status: DC | PRN

## 2012-05-27 MED ORDER — ESTROGENS CONJUGATED 1.25 MG PO TABS
1.2500 mg | ORAL_TABLET | Freq: Every day | ORAL | Status: DC
  Administered 2012-05-28 – 2012-05-29 (×2): 1.25 mg via ORAL
  Filled 2012-05-27 (×2): qty 1

## 2012-05-27 MED ORDER — LACTATED RINGERS IV SOLN
INTRAVENOUS | Status: DC | PRN
  Administered 2012-05-27: 08:00:00 via INTRAVENOUS

## 2012-05-27 MED ORDER — LANSOPRAZOLE 30 MG PO TBDP: 30.0000 mg | ORAL_TABLET | Freq: Every day | ORAL | Status: DC

## 2012-05-27 MED ORDER — VERAPAMIL HCL ER 180 MG PO TBCR
180.0000 mg | EXTENDED_RELEASE_TABLET | Freq: Two times a day (BID) | ORAL | Status: DC
  Administered 2012-05-27 – 2012-05-29 (×4): 180 mg via ORAL
  Filled 2012-05-27 (×5): qty 1

## 2012-05-27 MED ORDER — METOCLOPRAMIDE HCL 10 MG PO TABS: 5.0000 mg | ORAL_TABLET | Freq: Three times a day (TID) | ORAL | Status: DC | PRN

## 2012-05-27 MED ORDER — SODIUM CHLORIDE 0.9 % IV SOLN: INTRAVENOUS | Status: DC

## 2012-05-27 MED ORDER — MIDAZOLAM HCL 5 MG/5ML IJ SOLN
INTRAMUSCULAR | Status: DC | PRN
  Administered 2012-05-27: 2 mg via INTRAVENOUS

## 2012-05-27 MED ORDER — METOCLOPRAMIDE HCL 5 MG/ML IJ SOLN: 5.0000 mg | Freq: Three times a day (TID) | INTRAMUSCULAR | Status: DC | PRN

## 2012-05-27 MED ORDER — SUCCINYLCHOLINE CHLORIDE 20 MG/ML IJ SOLN
INTRAMUSCULAR | Status: DC | PRN
  Administered 2012-05-27: 100 mg via INTRAVENOUS

## 2012-05-27 MED ORDER — METHOCARBAMOL 500 MG PO TABS
500.0000 mg | ORAL_TABLET | Freq: Four times a day (QID) | ORAL | Status: DC | PRN
  Administered 2012-05-27 – 2012-05-29 (×2): 500 mg via ORAL
  Filled 2012-05-27 (×3): qty 1

## 2012-05-27 MED ORDER — WARFARIN VIDEO
Freq: Once | Status: AC
  Administered 2012-05-27: 14:00:00

## 2012-05-27 MED ORDER — METHOCARBAMOL 100 MG/ML IJ SOLN
500.0000 mg | Freq: Four times a day (QID) | INTRAVENOUS | Status: DC | PRN
  Filled 2012-05-27: qty 5

## 2012-05-27 MED ORDER — ACETAMINOPHEN 10 MG/ML IV SOLN: 1000.0000 mg | Freq: Once | INTRAVENOUS | Status: DC | PRN

## 2012-05-27 MED ORDER — LIDOCAINE HCL (CARDIAC) 20 MG/ML IV SOLN
INTRAVENOUS | Status: DC | PRN
  Administered 2012-05-27: 50 mg via INTRAVENOUS

## 2012-05-27 MED ORDER — CITALOPRAM HYDROBROMIDE 40 MG PO TABS
40.0000 mg | ORAL_TABLET | Freq: Every day | ORAL | Status: DC
  Administered 2012-05-28 – 2012-05-29 (×2): 40 mg via ORAL
  Filled 2012-05-27 (×2): qty 1

## 2012-05-27 MED ORDER — CEFAZOLIN SODIUM 1-5 GM-% IV SOLN
1.0000 g | Freq: Four times a day (QID) | INTRAVENOUS | Status: AC
  Administered 2012-05-27 – 2012-05-28 (×3): 1 g via INTRAVENOUS
  Filled 2012-05-27 (×3): qty 50

## 2012-05-27 MED ORDER — DEXAMETHASONE SODIUM PHOSPHATE 10 MG/ML IJ SOLN
INTRAMUSCULAR | Status: DC | PRN
  Administered 2012-05-27: 10 mg via INTRAVENOUS

## 2012-05-27 MED ORDER — HYDROMORPHONE HCL PF 1 MG/ML IJ SOLN: 0.5000 mg | INTRAMUSCULAR | Status: DC | PRN

## 2012-05-27 MED ORDER — PROPOFOL 10 MG/ML IV BOLUS
INTRAVENOUS | Status: DC | PRN
  Administered 2012-05-27: 200 mg via INTRAVENOUS

## 2012-05-27 MED ORDER — INFLUENZA VIRUS VACC SPLIT PF IM SUSP
0.5000 mL | INTRAMUSCULAR | Status: AC
  Administered 2012-05-28: 0.5 mL via INTRAMUSCULAR
  Filled 2012-05-27: qty 0.5

## 2012-05-27 MED ORDER — VERAPAMIL HCL ER 180 MG PO CP24: 180.0000 mg | ORAL_CAPSULE | Freq: Two times a day (BID) | ORAL | Status: DC

## 2012-05-27 MED ORDER — SUMATRIPTAN SUCCINATE 100 MG PO TABS
100.0000 mg | ORAL_TABLET | ORAL | Status: DC | PRN
  Filled 2012-05-27: qty 1

## 2012-05-27 MED ORDER — LISINOPRIL 5 MG PO TABS
5.0000 mg | ORAL_TABLET | Freq: Every day | ORAL | Status: DC
  Administered 2012-05-28 – 2012-05-29 (×2): 5 mg via ORAL
  Filled 2012-05-27 (×2): qty 1

## 2012-05-27 MED ORDER — FENTANYL CITRATE 0.05 MG/ML IJ SOLN
INTRAMUSCULAR | Status: DC | PRN
  Administered 2012-05-27 (×5): 50 ug via INTRAVENOUS

## 2012-05-27 MED ORDER — PATIENT'S GUIDE TO USING COUMADIN BOOK
Freq: Once | Status: AC
  Administered 2012-05-27: 12:00:00
  Filled 2012-05-27: qty 1

## 2012-05-27 MED ORDER — ZOLPIDEM TARTRATE 10 MG PO TABS
10.0000 mg | ORAL_TABLET | Freq: Every day | ORAL | Status: DC
  Administered 2012-05-27 – 2012-05-28 (×2): 10 mg via ORAL
  Filled 2012-05-27 (×2): qty 1

## 2012-05-27 MED ORDER — ONDANSETRON HCL 4 MG/2ML IJ SOLN: 4.0000 mg | Freq: Four times a day (QID) | INTRAMUSCULAR | Status: DC | PRN

## 2012-05-27 SURGICAL SUPPLY — 41 items
BANDAGE GAUZE ELAST BULKY 4 IN (GAUZE/BANDAGES/DRESSINGS) ×2 IMPLANT
BLADE SURG 10 STRL SS (BLADE) ×2 IMPLANT
BNDG COHESIVE 6X5 TAN STRL LF (GAUZE/BANDAGES/DRESSINGS) ×2 IMPLANT
BNDG ESMARK 4X9 LF (GAUZE/BANDAGES/DRESSINGS) IMPLANT
BNDG GAUZE STRTCH 6 (GAUZE/BANDAGES/DRESSINGS) ×2 IMPLANT
CLOTH BEACON ORANGE TIMEOUT ST (SAFETY) ×2 IMPLANT
COTTON STERILE ROLL (GAUZE/BANDAGES/DRESSINGS) ×2 IMPLANT
CUFF TOURNIQUET SINGLE 34IN LL (TOURNIQUET CUFF) IMPLANT
CUFF TOURNIQUET SINGLE 44IN (TOURNIQUET CUFF) IMPLANT
DRAPE INCISE IOBAN 66X45 STRL (DRAPES) ×2 IMPLANT
DRAPE U-SHAPE 47X51 STRL (DRAPES) ×2 IMPLANT
DRSG ADAPTIC 3X8 NADH LF (GAUZE/BANDAGES/DRESSINGS) ×2 IMPLANT
DRSG PAD ABDOMINAL 8X10 ST (GAUZE/BANDAGES/DRESSINGS) ×2 IMPLANT
DURAPREP 26ML APPLICATOR (WOUND CARE) ×2 IMPLANT
ELECT REM PT RETURN 9FT ADLT (ELECTROSURGICAL) ×2
ELECTRODE REM PT RTRN 9FT ADLT (ELECTROSURGICAL) ×1 IMPLANT
GLOVE BIOGEL PI IND STRL 9 (GLOVE) ×1 IMPLANT
GLOVE BIOGEL PI INDICATOR 9 (GLOVE) ×1
GLOVE SURG ORTHO 9.0 STRL STRW (GLOVE) ×2 IMPLANT
GOWN PREVENTION PLUS XLARGE (GOWN DISPOSABLE) ×2 IMPLANT
GOWN SRG XL XLNG 56XLVL 4 (GOWN DISPOSABLE) ×2 IMPLANT
GOWN STRL NON-REIN XL XLG LVL4 (GOWN DISPOSABLE) ×4
IMMOBILIZER KNEE 22 UNIV (SOFTGOODS) ×2 IMPLANT
KIT ROOM TURNOVER OR (KITS) ×2 IMPLANT
MANIFOLD NEPTUNE II (INSTRUMENTS) ×2 IMPLANT
NDL SUT .5 MAYO 1.404X.05X (NEEDLE) ×1 IMPLANT
NEEDLE MAYO TAPER (NEEDLE) ×2
NS IRRIG 1000ML POUR BTL (IV SOLUTION) ×2 IMPLANT
PACK ORTHO EXTREMITY (CUSTOM PROCEDURE TRAY) ×2 IMPLANT
PAD ARMBOARD 7.5X6 YLW CONV (MISCELLANEOUS) ×4 IMPLANT
SPONGE GAUZE 4X4 12PLY (GAUZE/BANDAGES/DRESSINGS) ×2 IMPLANT
SPONGE LAP 4X18 X RAY DECT (DISPOSABLE) ×4 IMPLANT
SUT ETHILON 3 0 FSLX (SUTURE) ×2 IMPLANT
SUT FIBERWIRE #2 38 T-5 BLUE (SUTURE) ×8
SUT MNCRL AB 3-0 PS2 18 (SUTURE) ×2 IMPLANT
SUTURE FIBERWR #2 38 T-5 BLUE (SUTURE) ×4 IMPLANT
TOWEL OR 17X24 6PK STRL BLUE (TOWEL DISPOSABLE) ×2 IMPLANT
TOWEL OR 17X26 10 PK STRL BLUE (TOWEL DISPOSABLE) ×2 IMPLANT
TUBE CONNECTING 12X1/4 (SUCTIONS) ×2 IMPLANT
WATER STERILE IRR 1000ML POUR (IV SOLUTION) ×2 IMPLANT
YANKAUER SUCT BULB TIP NO VENT (SUCTIONS) ×2 IMPLANT

## 2012-05-27 NOTE — Op Note (Signed)
OPERATIVE REPORT  DATE OF SURGERY: 05/27/2012  PATIENT:  Amy [name],  61 y.o. female  PRE-OPERATIVE DIAGNOSIS:  Right Patella Tendon Rupture  POST-OPERATIVE DIAGNOSIS:  Right Patella Tendon Rupture  PROCEDURE:  Procedure(s): PATELLA TENDON REPAIR  SURGEON:  Surgeon(s): Nadara Mustard, MD  ANESTHESIA:   regional and general  EBL:  min ML  SPECIMEN:  No Specimen  TOURNIQUET:  * No tourniquets in log *  PROCEDURE DETAILS: Patient is a 61 year old woman who is status post a total knee arthroplasty. She has had a progressive insufficiency of the patella tendon and presents at this time for patellar tendon reconstruction do to inability to perform activities of daily living with weakness of extension. Risks and benefits of surgery were discussed including infection neurovascular injury rerupture of the tendon. Patient states she understands and wishes to proceed at this time. Description of procedure patient was brought to the operating room and after undergoing a femoral block. She then underwent a general anesthetic. After adequate levels of anesthesia were obtained patient's right lower extremity was prepped using DuraPrep and draped into a sterile field Ioban was used to cover all exposed skin. An incision was made midline through previous incision for her total knee arthroplasty and the patella tendon was identified the disrupted area in the mid aspect the tendon was identified. Using #2 FiberWire 2 sutures were placed from the proximal aspect superior to the patella extending down to the disrupted area with 4 strands exiting from the proximal aspect of the patella tendon. Uses same technique 2 #2 fiber wire sutures were woven using the Krakw technique from the distal aspect with 4 strands exiting the distal end of the patella tendon. The patella was reduced and the patella tendon was repaired mid substance. Patient's knee was placed the range of motion she is stable range of motion.  The wound was irrigated subcutaneous was closed using 2-0 Vicryl the skin was closed using staples. The wound was covered with Adaptic orthopedic sponges AB dressing Kerlix and Coban she was placed in a knee immobilizer extubated taken to the PACU in stable condition.`  PLAN OF CARE: Admit to inpatient   PATIENT DISPOSITION:  PACU - hemodynamically stable.   Nadara Mustard, MD 05/27/2012 9:45 AM

## 2012-05-27 NOTE — Progress Notes (Signed)
ANTICOAGULATION CONSULT NOTE - Initial Consult  Pharmacy Consult for Coumadin Indication: VTE prophylaxis  Assessment: 64 YOF s/p R TKA with patellar tendon rupture, admitted for reconstruction of the patella tendon. Pharmacy is consulted to dose coumadin for VTE prophylaxis. Pt. Doesn't take any anticoagulants PTA, baseline INR 1.05. Coumadin point = 4  Goal of Therapy:  INR 2-3 Monitor platelets by anticoagulation protocol: Yes   Plan:  - Coumadin 7.5mg  po x 1 - Daily PT/ INR - Coumadin Book and video  Bayard Hugger, PharmD, BCPS  Clinical Pharmacist  Pager: [phone]   Allergies  Allergen Reactions  . Codeine Other (See Comments)    Nerves; depends on dose  . Darvocet (Propoxyphene-Acetaminophen) Other (See Comments)    pruritis  . Tramadol Other (See Comments)    Chest tightness  . Sulfa Antibiotics Rash    Vital Signs: Temp: 98.3 F (36.8 C) (10/16 1053) Temp src: Oral (10/16 0710) BP: 133/76 mmHg (10/16 1053) Pulse Rate: 82  (10/16 1053)  Labs: No results found for this basename: HGB:2,HCT:3,PLT:3,APTT:3,LABPROT:3,INR:3,HEPARINUNFRC:3,CREATININE:3,CKTOTAL:3,CKMB:3,TROPONINI:3 in the last 72 hours  The CrCl is unknown because both a height and weight (above a minimum accepted value) are required for this calculation.   Medical History: Past Medical History  Diagnosis Date  . Allergy   . PONV (postoperative nausea and vomiting)   . Hypertension   . GERD (gastroesophageal reflux disease)   . Headache     hx migraine     05/27/2012,11:09 AM

## 2012-05-27 NOTE — Transfer of Care (Signed)
Immediate Anesthesia Transfer of Care Note  Patient: Amy [name]  Procedure(s) Performed: Procedure(s) (LRB) with comments: PATELLA TENDON REPAIR (Right) - Right Patella Tendon  Reconstruction with Semi Tendonosum  Patient Location: PACU  Anesthesia Type: General and Regional  Level of Consciousness: awake, alert , oriented and patient cooperative  Airway & Oxygen Therapy: Patient Spontanous Breathing and Patient connected to face mask oxygen  Post-op Assessment: Report given to PACU RN, Post -op Vital signs reviewed and stable and Patient moving all extremities X 4  Post vital signs: Reviewed and stable  Complications: No apparent anesthesia complications

## 2012-05-27 NOTE — H&P (Signed)
Amy [name] is an 61 y.o. female.   Chief Complaint: Right knee status post total knee arthroplasty with patellar tendon rupture. HPI: Patient is a 61 year old woman who has developed patella all toe with lack of strength and extension of her knee status post total knee arthroplasty with a patella tendon rupture.  Past Medical History  Diagnosis Date  . Allergy   . PONV (postoperative nausea and vomiting)   . Hypertension   . GERD (gastroesophageal reflux disease)   . Headache     hx migraine    Past Surgical History  Procedure Date  . Joint replacement   . Spine surgery   . Fracture surgery   . Abdominal hysterectomy     No family history on file. Social History:  reports that she quit smoking about 17 years ago. She has never used smokeless tobacco. She reports that she does not drink alcohol or use illicit drugs.  Allergies:  Allergies  Allergen Reactions  . Codeine Other (See Comments)    Nerves; depends on dose  . Darvocet (Propoxyphene-Acetaminophen) Other (See Comments)    pruritis  . Tramadol Other (See Comments)    Chest tightness  . Sulfa Antibiotics Rash    No prescriptions prior to admission    No results found for this or any previous visit (from the past 48 hour(s)). No results found.  Review of Systems  All other systems reviewed and are negative.    There were no vitals taken for this visit. Physical Exam  Examination patient has patella ALT of with a palpable defect over the patellar tendon. She lacks 10 to full extension. Assessment/Plan Assessment: Patella tendon rupture right knee status post total knee arthroplasty.  Plan: Plan for reconstruction of the patella tendon. Discussed that we may either reinforce with the semi-tendinosis tendon or local patella tendon reconstruction. Risks and benefits were discussed including infection risk of DVT risk of current disruption of the patella tendon. Patient states she understands and wished  to proceed at this time.  Kaston Faughn V 05/27/2012, 6:19 AM

## 2012-05-27 NOTE — Progress Notes (Signed)
Orthopedic Tech Progress Note Patient Details:  Amy [name] [birth year] [id number]  Patient ID: [name], female   DOB: [birth year], 61 y.o.   MRN: [id number]   Shawnie Pons 05/27/2012, 11:17 AM Called bio-tech for bledsoe brace to be locked  in extension.

## 2012-05-27 NOTE — Progress Notes (Signed)
Physical Therapy Evaluation Patient Details Name: Amy [name] MRN: [id number] DOB: February 26, 1951 Today's Date: 05/27/2012 Time: 4098-1191 PT Time Calculation (min): 43 min  PT Assessment / Plan / Recommendation Clinical Impression  Pt is a 61 y.o. F s/p Patellar Tendon repair.  Pt motivated and willing to participate in therapy.  Pt will benefit from acute physical therapy to increase independence, strength, mobility, and safety awareness.    PT Assessment  Patient needs continued PT services    Follow Up Recommendations  No PT follow up (OP PT when MD deems appropriate)    Does the patient have the potential to tolerate intense rehabilitation      Barriers to Discharge None      Equipment Recommendations  Rolling walker with 5" wheels    Recommendations for Other Services Other (comment) (None)   Frequency Min 5X/week    Precautions / Restrictions Precautions Precautions: Fall;Knee Precaution Booklet Issued: No Required Braces or Orthoses: Other Brace/Splint (Bledsoe) Restrictions Weight Bearing Restrictions: Yes RLE Weight Bearing: Weight bearing as tolerated   Pertinent Vitals/Pain No pain reported.      Mobility  Bed Mobility Bed Mobility: Supine to Sit;Sitting - Scoot to Edge of Bed Supine to Sit: 4: Min guard;HOB flat Sitting - Scoot to Edge of Bed: 5: Supervision Details for Bed Mobility Assistance: Cues for proper technique and safety. Transfers Transfers: Sit to Stand;Stand to Sit Sit to Stand: 4: Min guard;With upper extremity assist;From bed;From toilet Stand to Sit: 4: Min guard;With upper extremity assist;To elevated surface;To chair/3-in-1 Details for Transfer Assistance: Cues for safety, proper technique, and proper hand placement. Ambulation/Gait Ambulation/Gait Assistance: 4: Min guard Ambulation Distance (Feet): 150 Feet Assistive device: Rolling walker Ambulation/Gait Assistance Details: Cues for proper technique, proper step sequence,  and safety. Gait Pattern: Step-through pattern;Antalgic Gait velocity: decreased Stairs: No Wheelchair Mobility Wheelchair Mobility: No    Shoulder Instructions     Exercises     PT Diagnosis: Difficulty walking;Abnormality of gait;Generalized weakness;Acute pain  PT Problem List: Decreased strength;Decreased mobility;Decreased activity tolerance;Decreased balance;Decreased safety awareness;Decreased knowledge of use of DME;Pain PT Treatment Interventions: DME instruction;Gait training;Stair training;Functional mobility training;Therapeutic activities;Therapeutic exercise;Neuromuscular re-education;Patient/family education;Balance training   PT Goals Acute Rehab PT Goals PT Goal Formulation: With patient Time For Goal Achievement: 06/03/12 Potential to Achieve Goals: Good Pt will go Supine/Side to Sit: with modified independence;with HOB 0 degrees PT Goal: Supine/Side to Sit - Progress: Goal set today Pt will go Sit to Supine/Side: with modified independence;with HOB 0 degrees PT Goal: Sit to Supine/Side - Progress: Goal set today Pt will go Sit to Stand: with modified independence;with upper extremity assist PT Goal: Sit to Stand - Progress: Goal set today Pt will go Stand to Sit: with modified independence;with upper extremity assist PT Goal: Stand to Sit - Progress: Goal set today Pt will Ambulate: >150 feet;with modified independence;with least restrictive assistive device PT Goal: Ambulate - Progress: Goal set today Pt will Go Up / Down Stairs: 3-5 stairs;with min assist;with least restrictive assistive device PT Goal: Up/Down Stairs - Progress: Goal set today  Visit Information  Last PT Received On: 05/27/12 Assistance Needed: +1    Subjective Data  Subjective: Feeling good with this pain medicine. Patient Stated Goal: To get better   Prior Functioning  Home Living Lives With: Spouse Available Help at Discharge: Family Type of Home: House Home Access: Stairs to  enter [employer] of Steps: 3 (in the front 4 back) Entrance Stairs-Rails: Left (only on the back) Home Layout:  One level Bathroom Shower/Tub: Forensic scientist: Handicapped height Bathroom Accessibility: Yes How Accessible: Accessible via walker Home Adaptive Equipment: None Prior Function Level of Independence: Independent Able to Take Stairs?: Yes Driving: Yes Vocation: Retired Musician: No difficulties Dominant Hand: Left    Cognition  Overall Cognitive Status: Appears within functional limits for tasks assessed/performed Arousal/Alertness: Awake/alert Orientation Level: Oriented X4 / Intact Behavior During Session: [facility] for tasks performed    Extremity/Trunk Assessment     Balance Balance Balance Assessed: No  End of Session PT - End of Session Equipment Utilized During Treatment: Gait belt;Other (comment) (Bledsoe brace) Activity Tolerance: Patient tolerated treatment well Patient left: in chair;with call bell/phone within reach Nurse Communication: Mobility status  GP     DITOMMASO, AMY 05/27/2012, 3:46 PM  Jake Shark, PT DPT [phone]

## 2012-05-27 NOTE — Progress Notes (Signed)
Orthopedic Tech Progress Note Patient Details:  Amy [name] [birth year] [id number]  Patient ID: [name], female   DOB: [birth year], 61 y.o.   MRN: [id number]   Shawnie Pons 05/27/2012, 4:11 PM LEFT HINGED KNEE BRACE LOCKED IN EXTENSION COMPLETED BY [employer]

## 2012-05-27 NOTE — Anesthesia Preprocedure Evaluation (Addendum)
Anesthesia Evaluation  Patient identified by MRN, date of birth, ID band Patient awake    Reviewed: Allergy & Precautions, H&P , NPO status , Patient's Chart, lab work & pertinent test results, reviewed documented beta blocker date and time   History of Anesthesia Complications (+) PONV  Airway Mallampati: I  Neck ROM: Full  Mouth opening: Limited Mouth Opening  Dental  (+) Teeth Intact and Dental Advisory Given   Pulmonary  breath sounds clear to auscultation        Cardiovascular hypertension, Pt. on home beta blockers Rhythm:Regular Rate:Normal     Neuro/Psych  Headaches,    GI/Hepatic GERD-  Medicated,  Endo/Other    Renal/GU      Musculoskeletal   Abdominal   Peds  Hematology   Anesthesia Other Findings   Reproductive/Obstetrics                          Anesthesia Physical Anesthesia Plan  ASA: II  Anesthesia Plan: General   Post-op Pain Management:    Induction:   Airway Management Planned: LMA  Additional Equipment:   Intra-op Plan:   Post-operative Plan:   Informed Consent: I have reviewed the patients History and Physical, chart, labs and discussed the procedure including the risks, benefits and alternatives for the proposed anesthesia with the patient or authorized representative who has indicated his/her understanding and acceptance.   Dental advisory given  Plan Discussed with: CRNA and Surgeon  Anesthesia Plan Comments: (Htn H/O post-op N/V S/P R. Knee Replacement with patella tendon rupture  Plan GA with FNB  Kipp Brood, MD)        Anesthesia Quick Evaluation

## 2012-05-27 NOTE — Progress Notes (Signed)
Utilization review completed. Ermel Verne, RN, BSN. 

## 2012-05-28 ENCOUNTER — Encounter (HOSPITAL_COMMUNITY): Payer: Self-pay | Admitting: Orthopedic Surgery

## 2012-05-28 MED ORDER — HYDROCODONE-ACETAMINOPHEN 5-500 MG PO TABS: 1.0000 | ORAL_TABLET | Freq: Four times a day (QID) | ORAL | Status: DC | PRN

## 2012-05-28 MED ORDER — WARFARIN SODIUM 7.5 MG PO TABS
7.5000 mg | ORAL_TABLET | Freq: Once | ORAL | Status: AC
  Administered 2012-05-28: 7.5 mg via ORAL
  Filled 2012-05-28: qty 1

## 2012-05-28 NOTE — Progress Notes (Signed)
ANTICOAGULATION CONSULT NOTE - Follow Up Consult  Pharmacy Consult for Coumadin Indication: VTE prophylaxis  Allergies  Allergen Reactions  . Codeine Other (See Comments)    Nerves; depends on dose  . Darvocet (Propoxyphene-Acetaminophen) Other (See Comments)    pruritis  . Tramadol Other (See Comments)    Chest tightness  . Sulfa Antibiotics Rash    Admit Complaint: 61 y.o.  female  admitted 05/27/2012 for reconstruction of the patella tendon after post TKA rupture. Pharmacy consulted to dose warfarin for VTE Prophylaxsis.  Overnight Events: 05/28/2012 Post op day 1, pain controled, no bleeding noted, possible discharge this afternoon  Assessment: Anticoagulation: VTE Prophylaxis, warfarin s/p one dose 7.5 mg.  Cardiovascular: HTN lisinopril, metoprolol, verapamil: BP up to 150s, HR 70-100s  Endocrinology: estrogen  Gastrointestinal / Nutrition: GERD: protonix  Neurology/MSK: Post op pain, migraine: citalopram  PTA Medication Issues: All Home Meds Ordered:   Best Practices: DVT Prophylaxis:  warfarin  Goal of Therapy:  INR 2-3 Monitor platelets by anticoagulation protocol: Yes   Plan:  - Coumadin 7.5mg  po x 1 - Daily PT/ INR  Thank you for allowing pharmacy to be a part of this patients care team.  Lovenia Kim Pharm.D., BCPS Clinical Pharmacist 05/28/2012 10:10 AM Pager: [phone] Phone: [phone]   Vital Signs: Temp: 98.4 F (36.9 C) (10/17 0653) BP: 150/80 mmHg (10/17 0653) Pulse Rate: 92  (10/17 0653)  Labs:  Basename 05/28/12 0545  HGB --  HCT --  PLT --  APTT --  LABPROT 14.2  INR 1.11  HEPARINUNFRC --  CREATININE --  CKTOTAL --  CKMB --  TROPONINI --    The CrCl is unknown because both a height and weight (above a minimum accepted value) are required for this calculation.   Medical History: Past Medical History  Diagnosis Date  . Allergy   . PONV (postoperative nausea and vomiting)   . Hypertension   . GERD  (gastroesophageal reflux disease)   . Headache     hx migraine

## 2012-05-28 NOTE — Progress Notes (Signed)
Physical Therapy Treatment Patient Details Name: Amy [name] MRN: [id number] DOB: 29-Mar-1951 Today's Date: 05/28/2012 Time: 4098-1191 PT Time Calculation (min): 23 min  PT Assessment / Plan / Recommendation Comments on Treatment Session  Pt able to increase ambulation distance and performed stair negotiation.  Plan for next session is to review stair negotiation with husband present if possible and ambulate.    Follow Up Recommendations  No PT follow up     Does the patient have the potential to tolerate intense rehabilitation     Barriers to Discharge        Equipment Recommendations  Rolling walker with 5" wheels    Recommendations for Other Services Other (comment) (None)  Frequency Min 5X/week   Plan Discharge plan remains appropriate;Frequency remains appropriate    Precautions / Restrictions Precautions Precautions: Fall;Knee Required Braces or Orthoses: Other Brace/Splint (Bledsoe) Restrictions Weight Bearing Restrictions: Yes (Simultaneous filing. User may not have seen previous data.) RLE Weight Bearing: Weight bearing as tolerated   Pertinent Vitals/Pain Pain not reported and not rated.    Mobility  Bed Mobility Bed Mobility: Not assessed Transfers Transfers: Stand to Sit Stand to Sit: 5: Supervision;With upper extremity assist;To chair/3-in-1 Details for Transfer Assistance: Cues for safety Ambulation/Gait Ambulation/Gait Assistance: 5: Supervision Ambulation Distance (Feet): 300 Feet Assistive device: Rolling walker Ambulation/Gait Assistance Details: Supervision for safety Gait Pattern: Step-through pattern;Antalgic Gait velocity: decreased Stairs: Yes Stairs Assistance: 4: Min assist Stairs Assistance Details (indicate cue type and reason): (A) supporting RW and providing hand held (A). Cues for proper step sequence and safety awareness. Stair Management Technique: No rails;One rail Left;Step to pattern;Backwards;With walker;Other (comment)  (Hand held (A)) Number of Stairs: 4  ((2 trials)) Wheelchair Mobility Wheelchair Mobility: No    Exercises     PT Diagnosis:    PT Problem List:   PT Treatment Interventions:     PT Goals Acute Rehab PT Goals PT Goal Formulation: With patient Time For Goal Achievement: 06/03/12 Potential to Achieve Goals: Good Pt will go Stand to Sit: with modified independence;with upper extremity assist PT Goal: Stand to Sit - Progress: Progressing toward goal Pt will Ambulate: >150 feet;with modified independence;with least restrictive assistive device PT Goal: Ambulate - Progress: Partly met Pt will Go Up / Down Stairs: 3-5 stairs;with min assist;with least restrictive assistive device PT Goal: Up/Down Stairs - Progress: Met  Visit Information  Last PT Received On: 05/28/12 Assistance Needed: +1    Subjective Data  Subjective: I want to go home tomorrow. Patient Stated Goal: To get better.   Cognition  Overall Cognitive Status: Appears within functional limits for tasks assessed/performed Arousal/Alertness: Awake/alert Orientation Level: Appears intact for tasks assessed Behavior During Session: [facility] for tasks performed    Balance  Balance Balance Assessed: No  End of Session PT - End of Session Equipment Utilized During Treatment: Gait belt;Other (comment) (Bledsoe) Activity Tolerance: Patient tolerated treatment well Patient left: in chair;with call bell/phone within reach Nurse Communication: Mobility status   GP     Amy [name] 05/28/2012, 8:47 AM

## 2012-05-28 NOTE — Progress Notes (Signed)
Patient ID: Amy [name], female   DOB: 1950/10/20, 61 y.o.   MRN: [id number] Postoperative day 1 right patella tendon reconstruction status post total knee arthroplasty. Patient states she is feeling better she is able to ambulate in the hallway yesterday continue therapy today discussed that if she is independent with her ambulation she can be discharged this afternoon otherwise we will discharge tomorrow morning. Prescription for Vicodin on the chart. Followup in the office in 2 weeks.

## 2012-05-28 NOTE — Progress Notes (Signed)
Agree with PT treatment.    Maurice Ramseur, PT DPT [phone]  

## 2012-05-29 LAB — PROTIME-INR
INR: 1.62 — ABNORMAL HIGH (ref 0.00–1.49)
Prothrombin Time: 18.7 seconds — ABNORMAL HIGH (ref 11.6–15.2)

## 2012-05-29 MED ORDER — OXYCODONE-ACETAMINOPHEN 5-325 MG PO TABS: 1.0000 | ORAL_TABLET | ORAL | Status: DC | PRN

## 2012-05-29 MED ORDER — WARFARIN SODIUM 2.5 MG PO TABS
2.5000 mg | ORAL_TABLET | Freq: Once | ORAL | Status: DC
  Filled 2012-05-29: qty 1

## 2012-05-29 NOTE — Progress Notes (Signed)
CARE MANAGEMENT NOTE 05/29/2012  Patient:  Amy [name], Amy [name]   Account Number:  [id number]  Date Initiated:  05/29/2012  Documentation initiated by:  Vance Peper  Subjective/Objective Assessment:   61 yr old female s/p right patellar tendon repair     Action/Plan:   Patient has no home health needs identified.   Anticipated DC Date:  05/29/2012   Anticipated DC Plan:  HOME/SELF CARE      DC Planning Services  CM consult      Choice offered to / List presented to:             Status of service:  Completed, signed off Medicare Important Message given?   (If response is "NO", the following Medicare IM given date fields will be blank) Date Medicare IM given:   Date Additional Medicare IM given:    Discharge Disposition:  HOME/SELF CARE  Per UR Regulation:    If discussed at Long Length of Stay Meetings, dates discussed:    Comments:

## 2012-05-29 NOTE — Discharge Summary (Signed)
Physician Discharge Summary  Patient ID: Amy [name] MRN: [id number] DOB/AGE: [birth year] 61 y.o.  Admit date: 05/27/2012 Discharge date: 05/29/2012  Admission Diagnoses: Right patellar tendon rupture  Discharge Diagnoses: Same Active Problems:  * No active hospital problems. *    Discharged Condition: stable  Hospital Course: Patient's hospital course was essentially unremarkable. She underwent patella tendon reconstruction and was discharged to home.  Consults: None  Significant Diagnostic Studies: labs: Routine labs  Treatments: surgery: See operative note  Discharge Exam: Blood pressure 128/76, pulse 60, temperature 98.8 F (37.1 C), temperature source Oral, resp. rate 18, SpO2 99.00%. Incision/Wound: clean and dry  Disposition:   Discharge Orders    Future Orders Please Complete By Expires   Diet - low sodium heart healthy      Call MD / Call 911      Comments:   If you experience chest pain or shortness of breath, CALL 911 and be transported to the hospital emergency room.  If you develope a fever above 101 F, pus (white drainage) or increased drainage or redness at the wound, or calf pain, call your surgeon's office.   Constipation Prevention      Comments:   Drink plenty of fluids.  Prune juice may be helpful.  You may use a stool softener, such as Colace (over the counter) 100 mg twice a day.  Use MiraLax (over the counter) for constipation as needed.   Increase activity slowly as tolerated          Medication List     As of 05/29/2012  7:14 AM    TAKE these medications         citalopram 40 MG tablet   Commonly known as: CELEXA   Take 1 tablet (40 mg total) by mouth daily.      estrogens (conjugated) 1.25 MG tablet   Commonly known as: PREMARIN   Take 1 tablet (1.25 mg total) by mouth daily.      HYDROcodone-acetaminophen 5-325 MG per tablet   Commonly known as: NORCO/VICODIN   Take 1 tablet by mouth every 4 (four) hours as needed. For pain        HYDROcodone-acetaminophen 5-500 MG per tablet   Commonly known as: VICODIN   Take 1 tablet by mouth every 6 (six) hours as needed for pain.      lansoprazole 30 MG disintegrating tablet   Commonly known as: PREVACID SOLUTAB   Take 1 tablet (30 mg total) by mouth daily.      lisinopril 5 MG tablet   Commonly known as: PRINIVIL,ZESTRIL   Take 1 tablet (5 mg total) by mouth daily.      metoprolol succinate 100 MG 24 hr tablet   Commonly known as: TOPROL-XL   Take 1 tablet (100 mg total) by mouth daily. Take with or immediately following a meal.      oxyCODONE-acetaminophen 5-325 MG per tablet   Commonly known as: PERCOCET/ROXICET   Take 1 tablet by mouth every 4 (four) hours as needed for pain.      SUMAtriptan 100 MG tablet   Commonly known as: IMITREX   Take 100 mg by mouth every 2 (two) hours as needed. For migraines      verapamil 180 MG 24 hr capsule   Commonly known as: VERELAN PM   Take 1 capsule (180 mg total) by mouth 2 (two) times daily.      zolpidem 10 MG tablet   Commonly known as: AMBIEN   Take  10 mg by mouth at bedtime.           Follow-up Information    Follow up with Joshua Soulier V, MD. In 2 weeks.   Contact information:   [address] [place] [address] [phone]          Signed: Nadara Mustard 05/29/2012, 7:14 AM

## 2012-05-29 NOTE — Progress Notes (Signed)
ANTICOAGULATION CONSULT NOTE - Initial Consult  Pharmacy Consult for Coumadin Indication: VTE prophylaxis  Assessment: 68 YOF s/p R TKA with patellar tendon rupture, admitted for reconstruction of the patella tendon. On coumadin for post-op VTE prophylaxis. INR 1.61 from 1.11 after 2 doses of 7.5 mg, no new cbc, no bleeding per chart.  Goal of Therapy:  INR 2-3 Monitor platelets by anticoagulation protocol: Yes   Plan:  - Coumadin 2.5mg  po x 1 - Daily PT/ INR  Bayard Hugger, PharmD, BCPS  Clinical Pharmacist  Pager: [phone]   Allergies  Allergen Reactions  . Codeine Other (See Comments)    Nerves; depends on dose  . Darvocet (Propoxyphene-Acetaminophen) Other (See Comments)    pruritis  . Tramadol Other (See Comments)    Chest tightness  . Sulfa Antibiotics Rash    Vital Signs: Temp: 98.8 F (37.1 C) (10/18 0647) BP: 103/77 mmHg (10/18 1103) Pulse Rate: 62  (10/18 1106)  Labs:  Basename 05/29/12 0640 05/28/12 0545  HGB -- --  HCT -- --  PLT -- --  APTT -- --  LABPROT 18.7* 14.2  INR 1.62* 1.11  HEPARINUNFRC -- --  CREATININE -- --  CKTOTAL -- --  CKMB -- --  TROPONINI -- --    The CrCl is unknown because both a height and weight (above a minimum accepted value) are required for this calculation.   Medical History: Past Medical History  Diagnosis Date  . Allergy   . PONV (postoperative nausea and vomiting)   . Hypertension   . GERD (gastroesophageal reflux disease)   . Headache     hx migraine     05/29/2012,12:34 PM

## 2012-06-01 ENCOUNTER — Other Ambulatory Visit: Payer: Self-pay | Admitting: Physician Assistant

## 2012-06-02 ENCOUNTER — Other Ambulatory Visit: Payer: Self-pay | Admitting: Physician Assistant

## 2012-06-02 NOTE — Telephone Encounter (Signed)
Pt is calling to be sure we received her rx request from the pharmacy  please call [phone] to confirm receipt of faxed request.

## 2012-06-18 NOTE — Progress Notes (Signed)
Completed prior auth over the phone for pt's prevacid 30 mg solutabs and received approval (pt has tried/failed omeprazole and protonix in the past). Faxed approval notice to pharmacy.

## 2012-06-22 ENCOUNTER — Encounter (HOSPITAL_COMMUNITY): Payer: Self-pay

## 2012-07-12 ENCOUNTER — Other Ambulatory Visit: Payer: Self-pay | Admitting: Physician Assistant

## 2012-07-15 ENCOUNTER — Telehealth: Payer: Self-pay | Admitting: Radiology

## 2012-07-15 NOTE — Telephone Encounter (Signed)
Called in rx alprazolam 0.25mg  1 po bid prn #[phone] refills [facility]

## 2012-07-15 NOTE — Telephone Encounter (Signed)
pharmacy requests Rx for Alprazolam 0.25 please advise.

## 2012-07-15 NOTE — Telephone Encounter (Signed)
Looks like this was called in on 07/12/12.

## 2012-07-22 ENCOUNTER — Ambulatory Visit: Payer: 59 | Admitting: Physician Assistant

## 2012-07-25 ENCOUNTER — Other Ambulatory Visit: Payer: Self-pay | Admitting: Physician Assistant

## 2012-07-25 NOTE — Telephone Encounter (Signed)
Request for refill ambien.  Authorized.  Needs OV.

## 2012-07-26 ENCOUNTER — Telehealth: Payer: Self-pay | Admitting: Family Medicine

## 2012-07-26 NOTE — Telephone Encounter (Signed)
Called in Ambien to [facility] and left on pharmacy vm

## 2012-08-06 ENCOUNTER — Other Ambulatory Visit: Payer: Self-pay | Admitting: Physician Assistant

## 2012-08-07 ENCOUNTER — Telehealth: Payer: Self-pay

## 2012-08-07 MED ORDER — LISINOPRIL 5 MG PO TABS: 5.0000 mg | ORAL_TABLET | Freq: Every day | ORAL | Status: DC

## 2012-08-07 MED ORDER — ZOLPIDEM TARTRATE 10 MG PO TABS: 10.0000 mg | ORAL_TABLET | Freq: Every evening | ORAL | Status: DC | PRN

## 2012-08-07 NOTE — Telephone Encounter (Signed)
What? A brain tumor?  Please get the details!  I don't see anything about that in her electronic record... I've refilled her meds.  When is her surgery?

## 2012-08-07 NOTE — Telephone Encounter (Signed)
Please forward to Ms. Leotis Shames, PA-C.

## 2012-08-07 NOTE — Telephone Encounter (Signed)
Pt states that she was informed that she will need a OV in order to receive a refill on her lisinopril and ambien. Pt states that she recently found out that she had a brain tumor, she would like to know if it would be okay for her to have those medications refilled then come in for a ov after her brain tumor surgery. Please advise.   [phone] Pharmacy: [facility] in [place]

## 2012-08-07 NOTE — Telephone Encounter (Signed)
Please advise 

## 2012-08-08 NOTE — Telephone Encounter (Signed)
Rx sent in

## 2012-08-09 NOTE — Telephone Encounter (Signed)
I spoke to patient to advise about Rx. 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

## 2012-08-09 NOTE — Telephone Encounter (Signed)
Left message for her to call back

## 2012-08-12 HISTORY — PX: BRAIN SURGERY: SHX531

## 2012-08-19 ENCOUNTER — Other Ambulatory Visit: Payer: Self-pay | Admitting: Physician Assistant

## 2012-08-22 ENCOUNTER — Other Ambulatory Visit: Payer: Self-pay | Admitting: Physician Assistant

## 2012-08-22 ENCOUNTER — Telehealth: Payer: Self-pay

## 2012-08-22 NOTE — Telephone Encounter (Signed)
PT STATES THAT THE PHARMACY ([place], [place] - [address]) IS STATING THAT WE HAVE NOT YET RESPONDED TO THEIR REQUESTS FOR ZOLPIDEM. [phone]

## 2012-08-23 NOTE — Telephone Encounter (Signed)
rx called into pharmacy and pt notified

## 2012-08-31 ENCOUNTER — Other Ambulatory Visit: Payer: Self-pay | Admitting: Physician Assistant

## 2012-09-09 ENCOUNTER — Other Ambulatory Visit: Payer: Self-pay | Admitting: Physician Assistant

## 2012-09-12 DIAGNOSIS — D320 Benign neoplasm of cerebral meninges: Secondary | ICD-10-CM

## 2012-09-12 HISTORY — DX: Benign neoplasm of cerebral meninges: D32.0

## 2012-09-15 ENCOUNTER — Other Ambulatory Visit: Payer: Self-pay | Admitting: Physician Assistant

## 2012-09-26 ENCOUNTER — Other Ambulatory Visit: Payer: Self-pay | Admitting: Physician Assistant

## 2012-09-29 ENCOUNTER — Other Ambulatory Visit: Payer: Self-pay | Admitting: Physician Assistant

## 2012-10-19 ENCOUNTER — Other Ambulatory Visit: Payer: Self-pay | Admitting: Physician Assistant

## 2012-10-21 ENCOUNTER — Other Ambulatory Visit: Payer: Self-pay | Admitting: Radiology

## 2012-10-21 NOTE — Telephone Encounter (Signed)
Amy [name]   Patient would like to talk with you regarding her Tumor Removal   [phone]

## 2012-10-21 NOTE — Telephone Encounter (Signed)
.  called in her Ambien.

## 2012-10-21 NOTE — Telephone Encounter (Signed)
Please call patient.  I printed her zolpidem Rx. Please get an update regarding the tumor behind her ear and her treatment course thus far.

## 2012-10-22 NOTE — Telephone Encounter (Signed)
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

## 2012-10-25 ENCOUNTER — Other Ambulatory Visit: Payer: Self-pay | Admitting: Physician Assistant

## 2012-10-26 NOTE — Telephone Encounter (Signed)
Contacted pt and she stated that she just had her brain surgery 19 days ago and is doing OK but her face is paralyzed right now. She has appt w/surg today. The tumor was benign. Pt stated she will be in to f/up w/Chelle as soon as possible. I am sending in RF of Metorprolol.

## 2012-11-06 ENCOUNTER — Other Ambulatory Visit: Payer: Self-pay | Admitting: Physician Assistant

## 2012-11-06 NOTE — Telephone Encounter (Signed)
Needs office visit 2nd notice 

## 2012-11-13 ENCOUNTER — Other Ambulatory Visit: Payer: Self-pay | Admitting: Physician Assistant

## 2012-11-14 NOTE — Telephone Encounter (Signed)
Needs OV - 2nd notice

## 2012-11-21 DIAGNOSIS — H11829 Conjunctivochalasis, unspecified eye: Secondary | ICD-10-CM | POA: Diagnosis not present

## 2012-11-30 ENCOUNTER — Other Ambulatory Visit: Payer: Self-pay | Admitting: Physician Assistant

## 2012-12-04 ENCOUNTER — Other Ambulatory Visit: Payer: Self-pay | Admitting: Physician Assistant

## 2012-12-11 ENCOUNTER — Other Ambulatory Visit: Payer: Self-pay | Admitting: Physician Assistant

## 2012-12-24 ENCOUNTER — Ambulatory Visit (INDEPENDENT_AMBULATORY_CARE_PROVIDER_SITE_OTHER): Payer: No Typology Code available for payment source | Admitting: Physician Assistant

## 2012-12-24 ENCOUNTER — Encounter: Payer: Self-pay | Admitting: Physician Assistant

## 2012-12-24 VITALS — BP 141/98 | HR 91 | Temp 97.4°F | Resp 16 | Ht 65.5 in | Wt 160.0 lb

## 2012-12-24 DIAGNOSIS — G4700 Insomnia, unspecified: Secondary | ICD-10-CM

## 2012-12-24 DIAGNOSIS — I10 Essential (primary) hypertension: Secondary | ICD-10-CM | POA: Insufficient documentation

## 2012-12-24 DIAGNOSIS — Z860100 Personal history of colon polyps, unspecified: Secondary | ICD-10-CM | POA: Insufficient documentation

## 2012-12-24 DIAGNOSIS — F419 Anxiety disorder, unspecified: Secondary | ICD-10-CM

## 2012-12-24 DIAGNOSIS — E785 Hyperlipidemia, unspecified: Secondary | ICD-10-CM

## 2012-12-24 DIAGNOSIS — K5790 Diverticulosis of intestine, part unspecified, without perforation or abscess without bleeding: Secondary | ICD-10-CM | POA: Insufficient documentation

## 2012-12-24 DIAGNOSIS — Z79890 Hormone replacement therapy (postmenopausal): Secondary | ICD-10-CM

## 2012-12-24 DIAGNOSIS — Z1239 Encounter for other screening for malignant neoplasm of breast: Secondary | ICD-10-CM

## 2012-12-24 DIAGNOSIS — G510 Bell's palsy: Secondary | ICD-10-CM

## 2012-12-24 DIAGNOSIS — Z1231 Encounter for screening mammogram for malignant neoplasm of breast: Secondary | ICD-10-CM

## 2012-12-24 DIAGNOSIS — K219 Gastro-esophageal reflux disease without esophagitis: Secondary | ICD-10-CM

## 2012-12-24 DIAGNOSIS — H9192 Unspecified hearing loss, left ear: Secondary | ICD-10-CM | POA: Insufficient documentation

## 2012-12-24 DIAGNOSIS — H9190 Unspecified hearing loss, unspecified ear: Secondary | ICD-10-CM

## 2012-12-24 DIAGNOSIS — G43109 Migraine with aura, not intractable, without status migrainosus: Secondary | ICD-10-CM | POA: Insufficient documentation

## 2012-12-24 DIAGNOSIS — C44611 Basal cell carcinoma of skin of unspecified upper limb, including shoulder: Secondary | ICD-10-CM | POA: Insufficient documentation

## 2012-12-24 DIAGNOSIS — F341 Dysthymic disorder: Secondary | ICD-10-CM

## 2012-12-24 DIAGNOSIS — G43909 Migraine, unspecified, not intractable, without status migrainosus: Secondary | ICD-10-CM

## 2012-12-24 MED ORDER — CITALOPRAM HYDROBROMIDE 40 MG PO TABS: 40.0000 mg | ORAL_TABLET | Freq: Every day | ORAL | Status: DC

## 2012-12-24 MED ORDER — ZOLPIDEM TARTRATE 10 MG PO TABS: 10.0000 mg | ORAL_TABLET | Freq: Every evening | ORAL | Status: DC | PRN

## 2012-12-24 MED ORDER — METOPROLOL SUCCINATE ER 100 MG PO TB24: 100.0000 mg | ORAL_TABLET | Freq: Every day | ORAL | Status: DC

## 2012-12-24 MED ORDER — ESTROGENS CONJUGATED 1.25 MG PO TABS: 1.2500 mg | ORAL_TABLET | Freq: Every day | ORAL | Status: DC

## 2012-12-24 MED ORDER — LISINOPRIL 5 MG PO TABS: 5.0000 mg | ORAL_TABLET | Freq: Every day | ORAL | Status: DC

## 2012-12-24 MED ORDER — VERAPAMIL HCL ER 180 MG PO CP24: 180.0000 mg | ORAL_CAPSULE | Freq: Two times a day (BID) | ORAL | Status: DC

## 2012-12-24 MED ORDER — ALPRAZOLAM 0.25 MG PO TABS: 0.2500 mg | ORAL_TABLET | Freq: Two times a day (BID) | ORAL | Status: DC | PRN

## 2012-12-24 NOTE — Patient Instructions (Signed)
Once you've recovered from the surgery to remove the meningioma, we'll re-try reducing the dose of the Premarin, in order to reduce the risk of a cardiovascular event, like a heart attack or stroke.

## 2012-12-24 NOTE — Progress Notes (Signed)
  Subjective:    Patient ID: Amy [name], female    DOB: September 29, 1950, 62 y.o.   MRN: [id number]  HPI This 62 y.o. female presents for medication refills.  Since I saw her last, she's had a number of health issues.  Patellar tendon reconstruction 05/2012 went well.  Developed an atypical HA 06/2012 and presented to the ED, where scan revealed a brain tumor.  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  She had a CSF leak immediately post-operatively. A weight has been placed in the LEFT eyelid to help protect the LEFT eye.  She has a positive attitude, and lots of family support.  Past medical history, surgical history, family history, social history and problem list reviewed.  Review of Systems As above.  No CP, SOB, dizziness, GI/GU symptoms.    Objective:   Physical Exam Blood pressure 141/98, pulse 91, temperature 97.4 F (36.3 C), temperature source Oral, resp. rate 16, height 5' 5.5" (1.664 m), weight 160 lb (72.576 kg), SpO2 98.00%. Body mass index is 26.21 kg/(m^2). Well-developed, well nourished WF who is awake, alert and oriented, in NAD. HEENT: [place]/AT.  Well-healed scar behind the LEFT ear.  LEFT facial palsy. Sclera and conjunctiva are clear.  EAC are patent, TMs are normal in appearance. Nasal mucosa is pink and moist. OP is clear. Neck: supple, non-tender, no lymphadenopathy, thyromegaly. Heart: RRR, no murmur Lungs: normal effort, CTA Extremities: no cyanosis, clubbing or edema. Skin: warm and dry without rash. Psychologic: good mood and appropriate affect, normal speech and behavior.  No labs drawn, as she has had regular labs with her recent surgeries and is not fasting today.      Assessment & Plan:  HTN (hypertension) - Plan: verapamil (VERELAN PM) 180 MG 24 hr capsule, metoprolol succinate (TOPROL-XL) 100 MG 24 hr tablet, lisinopril (PRINIVIL,ZESTRIL) 5 MG  tablet  Migraine  GERD (gastroesophageal reflux disease)  Hearing loss in left ear  Facial palsy  Insomnia - Plan: zolpidem (AMBIEN) 10 MG tablet  Hyperlipidemia  Breast cancer screening - Plan: MM Digital Screening  Postmenopausal HRT (hormone replacement therapy) - Plan: estrogens, conjugated, (PREMARIN) 1.25 MG tablet  Anxiety and depression - Plan: citalopram (CELEXA) 40 MG tablet, ALPRAZolam (XANAX) 0.25 MG tablet  RTC in 4-6 months, sooner if needed.  Plan CPE and fasting labs at that time.  Fernande Bras, PA-C Physician Assistant-Certified [facility]

## 2012-12-26 ENCOUNTER — Encounter: Payer: Self-pay | Admitting: Physician Assistant

## 2013-02-05 ENCOUNTER — Other Ambulatory Visit: Payer: Self-pay | Admitting: Physician Assistant

## 2013-02-05 DIAGNOSIS — D332 Benign neoplasm of brain, unspecified: Secondary | ICD-10-CM | POA: Diagnosis not present

## 2013-02-06 ENCOUNTER — Other Ambulatory Visit: Payer: Self-pay | Admitting: Physician Assistant

## 2013-02-09 ENCOUNTER — Other Ambulatory Visit: Payer: Self-pay | Admitting: Radiology

## 2013-02-09 ENCOUNTER — Other Ambulatory Visit: Payer: Self-pay | Admitting: Physician Assistant

## 2013-02-10 ENCOUNTER — Inpatient Hospital Stay: Admission: RE | Admit: 2013-02-10 | Payer: 59 | Source: Ambulatory Visit

## 2013-02-12 ENCOUNTER — Encounter: Payer: Self-pay | Admitting: Physician Assistant

## 2013-02-12 DIAGNOSIS — G510 Bell's palsy: Secondary | ICD-10-CM

## 2013-03-02 ENCOUNTER — Other Ambulatory Visit: Payer: Self-pay | Admitting: Physician Assistant

## 2013-03-09 ENCOUNTER — Other Ambulatory Visit: Payer: Self-pay | Admitting: Physician Assistant

## 2013-03-10 ENCOUNTER — Other Ambulatory Visit: Payer: Self-pay | Admitting: Physician Assistant

## 2013-03-10 NOTE — Telephone Encounter (Signed)
Pt states her pharmacy has contacted [place] several times since Sunday and we have not responded  Best phone for pt [phone]

## 2013-04-06 ENCOUNTER — Encounter: Payer: Self-pay | Admitting: Physician Assistant

## 2013-04-06 ENCOUNTER — Ambulatory Visit (INDEPENDENT_AMBULATORY_CARE_PROVIDER_SITE_OTHER): Payer: No Typology Code available for payment source | Admitting: Physician Assistant

## 2013-04-06 VITALS — BP 142/82 | HR 76 | Temp 98.3°F | Resp 16 | Ht 65.25 in | Wt 159.6 lb

## 2013-04-06 DIAGNOSIS — L299 Pruritus, unspecified: Secondary | ICD-10-CM

## 2013-04-06 DIAGNOSIS — I10 Essential (primary) hypertension: Secondary | ICD-10-CM

## 2013-04-06 DIAGNOSIS — G510 Bell's palsy: Secondary | ICD-10-CM

## 2013-04-06 DIAGNOSIS — J309 Allergic rhinitis, unspecified: Secondary | ICD-10-CM

## 2013-04-06 DIAGNOSIS — G4700 Insomnia, unspecified: Secondary | ICD-10-CM

## 2013-04-06 DIAGNOSIS — E785 Hyperlipidemia, unspecified: Secondary | ICD-10-CM

## 2013-04-06 LAB — COMPREHENSIVE METABOLIC PANEL
AST: 10 U/L (ref 0–37)
Alkaline Phosphatase: 34 U/L — ABNORMAL LOW (ref 39–117)
BUN: 6 mg/dL (ref 6–23)
Calcium: 9.1 mg/dL (ref 8.4–10.5)
Chloride: 102 mEq/L (ref 96–112)
Creat: 0.62 mg/dL (ref 0.50–1.10)
Total Bilirubin: 0.3 mg/dL (ref 0.3–1.2)

## 2013-04-06 LAB — LIPID PANEL
HDL: 47 mg/dL (ref >39)
Total CHOL/HDL Ratio: 3.7 Ratio

## 2013-04-06 MED ORDER — ZOLPIDEM TARTRATE 10 MG PO TABS: ORAL_TABLET | ORAL | Status: DC

## 2013-04-06 MED ORDER — FLUTICASONE PROPIONATE 50 MCG/ACT NA SUSP: 2.0000 | Freq: Every day | NASAL | Status: DC

## 2013-04-06 MED ORDER — HYDROXYZINE HCL 25 MG PO TABS: 12.5000 mg | ORAL_TABLET | Freq: Three times a day (TID) | ORAL | Status: DC | PRN

## 2013-04-06 NOTE — Patient Instructions (Signed)
You may use the hydroxyzine during the day, but it causes sedation, so you may want to reserve it for bedtime, and use Allegra or Claritin or Zyrtec each morning.

## 2013-04-06 NOTE — Progress Notes (Signed)
Subjective:    Patient ID: Amy [name], female    DOB: Mar 27, 1951, 62 y.o.   MRN: [id number]  HPI This 62 y.o. female presents for evaluation of swelling of the LEFT face.    Patient Active Problem List   Diagnosis Date Noted  . HTN (hypertension) 12/24/2012  . Migraine 12/24/2012  . GERD (gastroesophageal reflux disease) 12/24/2012  . Hearing loss in left ear 12/24/2012  . 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 Insomnia 12/24/2012  . Diverticulosis 12/24/2012  . Personal history of colonic polyps 12/24/2012  . Hyperlipidemia 12/24/2012  . BCC (basal cell carcinoma), arm 12/24/2012   There is some concern that she is reacting to the gold weight implanted in the LEFT eyelid.  Recall that she has had LEFT hearing loss and facial palsy since excision of a meningioma (which she named Amy [name]). A course of prednisone last month helped considerably. The LEFT eye is very itchy and the upper lid is red.  Different from her sinus/nasal allergies.  Swelling involves the eyelid and the surrounding eye area, and the LEFT cheek/face. She may need to have the weight removed. She's seen her dentist to see if there was a dental cause for the swelling, but no dental etiology was identified (she does have a broken tooth on the RIGHT side and will have a root canal).  Difficulty sleeping.  Difficulty falling asleep and staying asleep.  Easily aroused, feels like not getting deep sleep.  Uses the TV to block the tinnitus. Was stable on treatment for insomnia prior to her tumor excision.  Medications, allergies, past medical history, surgical history, family history, social history and problem list reviewed.   Review of Systems As above.    Objective:   Physical Exam Blood pressure 142/82, pulse 76, temperature 98.3 F (36.8 C), temperature source Oral, resp. rate 16, height 5' 5.25" (1.657 m), weight 159 lb 9.6 oz (72.394 kg), SpO2 96.00%. Body mass index is 26.37 kg/(m^2). Well-developed, well nourished WF  who is awake, alert and oriented, in NAD. HEENT: [place]/AT, though mild swelling noted of the LEFT face, from above the brow to the mandible.  Erythema noted of the LEFT eyelid only at the location of the weight. Sclera and conjunctiva are clear.  EAC are patent, TMs are normal in appearance. Nasal mucosa is pink and moist. OP is clear. Neck: supple, non-tender, no lymphadenopathy, thyromegaly. Heart: RRR, no murmur Lungs: normal effort, CTA Extremities: no cyanosis, clubbing or edema. Skin: warm and dry without rash. Psychologic: good mood and appropriate affect, normal speech and behavior.        Assessment & Plan:  Facial palsy  HTN (hypertension)  Hyperlipidemia - Plan: Comprehensive metabolic panel, Lipid panel  Insomnia - Plan: zolpidem (AMBIEN) 10 MG tablet, hydrOXYzine (ATARAX/VISTARIL) 25 MG tablet  Allergic rhinitis - Plan: fluticasone (FLONASE) 50 MCG/ACT nasal spray  Itching - Plan: hydrOXYzine (ATARAX/VISTARIL) 25 MG tablet  Continue current meds.  Add hydroxyzine to help with itching (given the significant improvement in her symptoms with prednisone, I suspect that she is reacting to the weight.  However, if it may resolve with time, I would encourage symptom management for now).  If the hydroxyzine makes her too sleepy to use during the day, she'll reserve it for HS and use a non-sedating antihistamine QAM. Expect the hydroxyzine to help her sleep as well.  RTC 2 weeks to reassess.    Fernande Bras, PA-C Physician Assistant-Certified [facility]

## 2013-04-07 ENCOUNTER — Encounter: Payer: Self-pay | Admitting: Physician Assistant

## 2013-04-17 ENCOUNTER — Telehealth: Payer: Self-pay

## 2013-04-17 NOTE — Telephone Encounter (Signed)
Pt seen the other day by chelle, pt wants to know if something can be called in for ringing in the ear

## 2013-04-17 NOTE — Telephone Encounter (Signed)
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  There is not medication, that I am aware of, that has shown benefit in the treatment of tinnitus caused by hearing loss.  I believe that she has an ENT specialist, and recommend that she discuss the problem with that person.  She may need evaluation with an audiologist.

## 2013-04-18 NOTE — Telephone Encounter (Signed)
Spoke with pt and advised message from [place]. Pt understood.

## 2013-04-18 NOTE — Telephone Encounter (Signed)
LMOM to CB. 

## 2013-04-20 ENCOUNTER — Other Ambulatory Visit: Payer: Self-pay | Admitting: Physician Assistant

## 2013-04-22 ENCOUNTER — Ambulatory Visit: Payer: No Typology Code available for payment source | Admitting: Physician Assistant

## 2013-04-29 ENCOUNTER — Other Ambulatory Visit: Payer: Self-pay | Admitting: Physician Assistant

## 2013-05-06 ENCOUNTER — Other Ambulatory Visit: Payer: Self-pay | Admitting: Physician Assistant

## 2013-05-06 NOTE — Telephone Encounter (Signed)
Pt states that she is out of her sleep medication. Please call [phone]

## 2013-05-07 ENCOUNTER — Other Ambulatory Visit: Payer: Self-pay | Admitting: Physician Assistant

## 2013-05-07 ENCOUNTER — Telehealth: Payer: Self-pay | Admitting: Radiology

## 2013-05-07 NOTE — Telephone Encounter (Signed)
This was done yesterday.  

## 2013-05-07 NOTE — Telephone Encounter (Signed)
Patient indicates she needs refill of her Ambien, this has already been sent.

## 2013-05-13 DIAGNOSIS — M779 Enthesopathy, unspecified: Secondary | ICD-10-CM | POA: Diagnosis not present

## 2013-05-13 DIAGNOSIS — G5760 Lesion of plantar nerve, unspecified lower limb: Secondary | ICD-10-CM | POA: Diagnosis not present

## 2013-05-13 DIAGNOSIS — M659 Synovitis and tenosynovitis, unspecified: Secondary | ICD-10-CM | POA: Diagnosis not present

## 2013-05-13 DIAGNOSIS — M65979 Unspecified synovitis and tenosynovitis, unspecified ankle and foot: Secondary | ICD-10-CM | POA: Diagnosis not present

## 2013-05-28 ENCOUNTER — Other Ambulatory Visit: Payer: Self-pay | Admitting: Physician Assistant

## 2013-05-30 ENCOUNTER — Other Ambulatory Visit: Payer: Self-pay | Admitting: Physician Assistant

## 2013-05-31 ENCOUNTER — Other Ambulatory Visit: Payer: Self-pay | Admitting: Physician Assistant

## 2013-06-01 ENCOUNTER — Other Ambulatory Visit: Payer: Self-pay

## 2013-06-01 ENCOUNTER — Other Ambulatory Visit: Payer: Self-pay | Admitting: Physician Assistant

## 2013-06-01 NOTE — Telephone Encounter (Signed)
PT WOULD LIKE [place] TO REFAX HER AMBIEN TO THE PHARMACY STATED THEY HAVEN'T RECEIVED THE REQUEST. PLEASE CALL PT AT [phone]     [facility] [place]

## 2013-06-01 NOTE — Telephone Encounter (Signed)
Called pharmacy to check if they received Rx. Reported that they did and pt has p/up already.

## 2013-06-07 DIAGNOSIS — D332 Benign neoplasm of brain, unspecified: Secondary | ICD-10-CM | POA: Diagnosis not present

## 2013-06-15 ENCOUNTER — Other Ambulatory Visit: Payer: Self-pay | Admitting: Physician Assistant

## 2013-06-28 ENCOUNTER — Other Ambulatory Visit: Payer: Self-pay | Admitting: Physician Assistant

## 2013-06-28 ENCOUNTER — Other Ambulatory Visit: Payer: Self-pay | Admitting: Radiology

## 2013-07-01 ENCOUNTER — Ambulatory Visit (INDEPENDENT_AMBULATORY_CARE_PROVIDER_SITE_OTHER): Payer: No Typology Code available for payment source | Admitting: Physician Assistant

## 2013-07-01 ENCOUNTER — Encounter: Payer: Self-pay | Admitting: Physician Assistant

## 2013-07-01 VITALS — BP 123/79 | HR 77 | Temp 98.2°F | Resp 18 | Ht 65.0 in | Wt 152.6 lb

## 2013-07-01 DIAGNOSIS — I10 Essential (primary) hypertension: Secondary | ICD-10-CM

## 2013-07-01 DIAGNOSIS — G510 Bell's palsy: Secondary | ICD-10-CM

## 2013-07-01 DIAGNOSIS — F419 Anxiety disorder, unspecified: Secondary | ICD-10-CM

## 2013-07-01 DIAGNOSIS — Z23 Encounter for immunization: Secondary | ICD-10-CM

## 2013-07-01 DIAGNOSIS — E785 Hyperlipidemia, unspecified: Secondary | ICD-10-CM

## 2013-07-01 DIAGNOSIS — Z1239 Encounter for other screening for malignant neoplasm of breast: Secondary | ICD-10-CM

## 2013-07-01 DIAGNOSIS — G4700 Insomnia, unspecified: Secondary | ICD-10-CM

## 2013-07-01 DIAGNOSIS — F329 Major depressive disorder, single episode, unspecified: Secondary | ICD-10-CM

## 2013-07-01 DIAGNOSIS — F341 Dysthymic disorder: Secondary | ICD-10-CM

## 2013-07-01 DIAGNOSIS — K219 Gastro-esophageal reflux disease without esophagitis: Secondary | ICD-10-CM

## 2013-07-01 MED ORDER — CITALOPRAM HYDROBROMIDE 40 MG PO TABS: 40.0000 mg | ORAL_TABLET | Freq: Every day | ORAL | Status: DC

## 2013-07-01 MED ORDER — LANSOPRAZOLE 30 MG PO TBDP: ORAL_TABLET | ORAL | Status: DC

## 2013-07-01 MED ORDER — VERAPAMIL HCL ER 180 MG PO CP24: ORAL_CAPSULE | ORAL | Status: DC

## 2013-07-01 MED ORDER — LISINOPRIL 5 MG PO TABS: 5.0000 mg | ORAL_TABLET | Freq: Every day | ORAL | Status: DC

## 2013-07-01 MED ORDER — ZOSTER VACCINE LIVE 19400 UNT/0.65ML ~~LOC~~ SOLR: 0.6500 mL | Freq: Once | SUBCUTANEOUS | Status: DC

## 2013-07-01 MED ORDER — ALPRAZOLAM 0.25 MG PO TABS: ORAL_TABLET | ORAL | Status: DC

## 2013-07-01 MED ORDER — METOPROLOL SUCCINATE ER 100 MG PO TB24: 100.0000 mg | ORAL_TABLET | Freq: Every day | ORAL | Status: DC

## 2013-07-01 NOTE — Patient Instructions (Signed)
Keep up the great work!  If you have not heard anything regarding the mammogram referral in 1 week, please contact our office. Please take the shingles vaccine prescription to the pharmacy, they will administer the vaccine there.

## 2013-07-01 NOTE — Progress Notes (Signed)
  [address]  [place], [place] [address]  [phone]  [email or web address]  Subjective:    Patient ID: Amy [name], female    DOB: 15-May-1951, 62 y.o.   MRN: [id number]  HPI This 62 y.o. female presents for medication refill.  Patient Active Problem List   Diagnosis Date Noted  . HTN (hypertension) 12/24/2012  . Migraine 12/24/2012  . GERD (gastroesophageal reflux disease) 12/24/2012  . Hearing loss in left ear 12/24/2012  . 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 Insomnia 12/24/2012  . Diverticulosis 12/24/2012  . Personal history of colonic polyps 12/24/2012  . Hyperlipidemia 12/24/2012  . BCC (basal cell carcinoma), arm 12/24/2012   She's to have a follow-up MRI 09/2013 to look at things after resolution of more inflammatory changes.  Then plan radiation to the area, since they aren't certain all the tumor was removed. The gold weight was removed from her eyelid, and the itching, swelling and redness resolved.  It's frustrating to have the persistent palsy, and she's self-conscious about her appearance in public.  Her good friend is dying of lung cancer, found after she had a stroke, and she's feeling especially sad about that.  However, it's also giving her perspective about her own situation.   Review of Systems No chest pain, SOB, HA, dizziness, N/V, diarrhea, constipation, dysuria, urinary urgency or frequency, myalgias, arthralgias or rash.     Objective:   Physical Exam Blood pressure 123/79, pulse 77, temperature 98.2 F (36.8 C), temperature source Oral, resp. rate 18, height 5\' 5"  (1.651 m), weight 152 lb 9.6 oz (69.219 kg), SpO2 96.00%. Body mass index is 25.39 kg/(m^2). Well-developed, well nourished WF who is awake, alert and oriented, in NAD. HEENT: [place]/AT, LEFT sided facial palsy is her baseline.  Sclera and conjunctiva are clear.   Neck: supple, non-tender, no lymphadenopathy, thyromegaly. Heart: RRR, no murmur Lungs: normal effort, CTA Extremities: no cyanosis,  clubbing or edema. Skin: warm and dry without rash. Psychologic: good mood and appropriate affect, normal speech and behavior.        Assessment & Plan:  HTN (hypertension) - Plan: verapamil (VERELAN PM) 180 MG 24 hr capsule, lisinopril (PRINIVIL,ZESTRIL) 5 MG tablet, metoprolol succinate (TOPROL-XL) 100 MG 24 hr tablet  Hyperlipidemia - labs done at her last visit.  Continue current treatment.  Recheck fasting in 6 months.  Insomnia - stable.  No longer needs vistaril since the weight was removed from the eyelid and the itching resolved.  Continue Ambien.  Anxiety and depression - Plan: citalopram (CELEXA) 40 MG tablet, ALPRAZolam (XANAX) 0.25 MG tablet  GERD (gastroesophageal reflux disease) - Plan: lansoprazole (PREVACID SOLUTAB) 30 MG disintegrating tablet  Facial palsy - continue follow-up with neurosurgery per their recommendations  Need for prophylactic vaccination and inoculation against influenza - Plan: Flu Vaccine QUAD 36+ mos IM  Need for shingles vaccine - Plan: zoster vaccine live, PF, (ZOSTAVAX) 72536 UNT/0.65ML injection  Screening for breast cancer - Plan: MM Digital Screening  RTC 6 months, sooner if needed.  Fernande Bras, PA-C Physician Assistant-Certified [facility]

## 2013-07-06 ENCOUNTER — Other Ambulatory Visit: Payer: Self-pay | Admitting: Physician Assistant

## 2013-07-17 ENCOUNTER — Other Ambulatory Visit: Payer: Self-pay | Admitting: Physician Assistant

## 2013-07-25 ENCOUNTER — Other Ambulatory Visit: Payer: Self-pay | Admitting: Physician Assistant

## 2013-07-26 ENCOUNTER — Other Ambulatory Visit: Payer: Self-pay | Admitting: Physician Assistant

## 2013-07-26 ENCOUNTER — Telehealth: Payer: Self-pay

## 2013-07-26 NOTE — Telephone Encounter (Signed)
Patient states that [facility] [place] however they have no record of a refill sent to them.  [phone]

## 2013-07-27 ENCOUNTER — Telehealth: Payer: Self-pay | Admitting: Radiology

## 2013-07-27 NOTE — Telephone Encounter (Signed)
Called in for her. Called her to advise.  Left message.

## 2013-07-28 ENCOUNTER — Other Ambulatory Visit: Payer: Self-pay | Admitting: Physician Assistant

## 2013-07-30 NOTE — Telephone Encounter (Signed)
This was med encounter. Was faxed.

## 2013-08-09 ENCOUNTER — Other Ambulatory Visit: Payer: Self-pay | Admitting: Physician Assistant

## 2013-08-10 ENCOUNTER — Other Ambulatory Visit: Payer: Self-pay | Admitting: Physician Assistant

## 2013-08-14 ENCOUNTER — Other Ambulatory Visit: Payer: Self-pay | Admitting: Physician Assistant

## 2013-09-15 ENCOUNTER — Other Ambulatory Visit: Payer: Self-pay | Admitting: Physician Assistant

## 2013-09-15 NOTE — Progress Notes (Signed)
PA approved for prevacid 30 mg through 08/11/2038. Notified pharm.

## 2013-09-16 ENCOUNTER — Other Ambulatory Visit: Payer: Self-pay | Admitting: Physician Assistant

## 2013-09-17 NOTE — Telephone Encounter (Signed)
faxed

## 2013-09-20 DIAGNOSIS — D333 Benign neoplasm of cranial nerves: Secondary | ICD-10-CM | POA: Diagnosis not present

## 2013-09-21 ENCOUNTER — Ambulatory Visit: Payer: 59

## 2013-09-23 DIAGNOSIS — D210 Benign neoplasm of connective and other soft tissue of head, face and neck: Secondary | ICD-10-CM | POA: Diagnosis not present

## 2013-09-25 DIAGNOSIS — J019 Acute sinusitis, unspecified: Secondary | ICD-10-CM | POA: Diagnosis not present

## 2013-10-06 ENCOUNTER — Other Ambulatory Visit: Payer: Self-pay | Admitting: Physician Assistant

## 2013-10-07 NOTE — Telephone Encounter (Signed)
faxed

## 2013-10-13 ENCOUNTER — Other Ambulatory Visit: Payer: Self-pay | Admitting: Physician Assistant

## 2013-10-14 ENCOUNTER — Other Ambulatory Visit: Payer: Self-pay | Admitting: Physician Assistant

## 2013-10-15 NOTE — Telephone Encounter (Signed)
faxed

## 2013-10-21 DIAGNOSIS — M659 Synovitis and tenosynovitis, unspecified: Secondary | ICD-10-CM | POA: Diagnosis not present

## 2013-10-21 DIAGNOSIS — M65979 Unspecified synovitis and tenosynovitis, unspecified ankle and foot: Secondary | ICD-10-CM | POA: Diagnosis not present

## 2013-10-21 DIAGNOSIS — G5760 Lesion of plantar nerve, unspecified lower limb: Secondary | ICD-10-CM | POA: Diagnosis not present

## 2013-10-26 ENCOUNTER — Other Ambulatory Visit: Payer: Self-pay | Admitting: Physician Assistant

## 2013-11-09 ENCOUNTER — Other Ambulatory Visit: Payer: Self-pay | Admitting: Physician Assistant

## 2013-11-09 NOTE — Telephone Encounter (Signed)
Spoke to pt, she is taking 1 tabet qhs, and sometimes taking an extra 1/3 tablet this is why her medication needs to be filled a few days early.

## 2013-11-09 NOTE — Telephone Encounter (Signed)
Pt would like to know if she could have a refill on zolpidem (AMBIEN) 10 MG tablet, she is currently out and aware that she does need an OV however she does have one scheduled on 12/30/13.

## 2013-11-10 ENCOUNTER — Other Ambulatory Visit: Payer: Self-pay | Admitting: Physician Assistant

## 2013-11-10 NOTE — Telephone Encounter (Signed)
Faxed

## 2013-12-07 ENCOUNTER — Other Ambulatory Visit: Payer: Self-pay | Admitting: Physician Assistant

## 2013-12-08 NOTE — Telephone Encounter (Signed)
Pt has appt sch 12/30/13.

## 2013-12-09 ENCOUNTER — Telehealth: Payer: Self-pay

## 2013-12-09 DIAGNOSIS — G5760 Lesion of plantar nerve, unspecified lower limb: Secondary | ICD-10-CM | POA: Diagnosis not present

## 2013-12-09 DIAGNOSIS — M65979 Unspecified synovitis and tenosynovitis, unspecified ankle and foot: Secondary | ICD-10-CM | POA: Diagnosis not present

## 2013-12-09 DIAGNOSIS — M659 Synovitis and tenosynovitis, unspecified: Secondary | ICD-10-CM | POA: Diagnosis not present

## 2013-12-09 MED ORDER — ZOLPIDEM TARTRATE 10 MG PO TABS: ORAL_TABLET | ORAL | Status: DC

## 2013-12-09 NOTE — Telephone Encounter (Signed)
Pt notified that I called this in for her.

## 2013-12-09 NOTE — Telephone Encounter (Signed)
Rx printed at 104. Will bring to 102 after clinic.  Meds ordered this encounter  Medications  . zolpidem (AMBIEN) 10 MG tablet    Sig: TAKE 1 TABLET AT BEDTIME    Dispense:  30 tablet    Refill:  0    Order Specific Question:  Supervising Provider    Answer:  DOOLITTLE, ROBERT P [[id number]]

## 2013-12-09 NOTE — Telephone Encounter (Signed)
Patient calling for refill on zolpidem (AMBIEN) 10 MG tablet   [phone]

## 2013-12-27 ENCOUNTER — Other Ambulatory Visit: Payer: Self-pay | Admitting: Physician Assistant

## 2013-12-30 ENCOUNTER — Encounter: Payer: Self-pay | Admitting: Physician Assistant

## 2013-12-30 ENCOUNTER — Ambulatory Visit (INDEPENDENT_AMBULATORY_CARE_PROVIDER_SITE_OTHER): Payer: BC Managed Care – PPO | Admitting: Physician Assistant

## 2013-12-30 VITALS — BP 122/85 | HR 90 | Temp 98.5°F | Resp 16 | Ht 65.0 in | Wt 153.0 lb

## 2013-12-30 DIAGNOSIS — F419 Anxiety disorder, unspecified: Secondary | ICD-10-CM

## 2013-12-30 DIAGNOSIS — I10 Essential (primary) hypertension: Secondary | ICD-10-CM

## 2013-12-30 DIAGNOSIS — F341 Dysthymic disorder: Secondary | ICD-10-CM

## 2013-12-30 DIAGNOSIS — G43909 Migraine, unspecified, not intractable, without status migrainosus: Secondary | ICD-10-CM

## 2013-12-30 DIAGNOSIS — F32A Depression, unspecified: Secondary | ICD-10-CM

## 2013-12-30 DIAGNOSIS — E785 Hyperlipidemia, unspecified: Secondary | ICD-10-CM

## 2013-12-30 DIAGNOSIS — F329 Major depressive disorder, single episode, unspecified: Secondary | ICD-10-CM

## 2013-12-30 DIAGNOSIS — Z1159 Encounter for screening for other viral diseases: Secondary | ICD-10-CM

## 2013-12-30 DIAGNOSIS — R739 Hyperglycemia, unspecified: Secondary | ICD-10-CM

## 2013-12-30 DIAGNOSIS — G4700 Insomnia, unspecified: Secondary | ICD-10-CM

## 2013-12-30 LAB — LIPID PANEL
Cholesterol: 193 mg/dL (ref 0–200)
HDL: 59 mg/dL (ref >39)
LDL Cholesterol: 99 mg/dL (ref 0–99)
TRIGLYCERIDES: 177 mg/dL — AB (ref <150)
Total CHOL/HDL Ratio: 3.3 Ratio
VLDL: 35 mg/dL (ref 0–40)

## 2013-12-30 LAB — COMPLETE METABOLIC PANEL WITH GFR
ALT: 9 U/L (ref 0–35)
AST: 14 U/L (ref 0–37)
Albumin: 4.1 g/dL (ref 3.5–5.2)
Alkaline Phosphatase: 35 U/L — ABNORMAL LOW (ref 39–117)
BUN: 4 mg/dL — ABNORMAL LOW (ref 6–23)
CALCIUM: 9.5 mg/dL (ref 8.4–10.5)
CHLORIDE: 100 meq/L (ref 96–112)
CO2: 24 mEq/L (ref 19–32)
Creat: 0.6 mg/dL (ref 0.50–1.10)
GFR, Est African American: >89 mL/min
GFR, Est Non African American: >89 mL/min
Glucose, Bld: 177 mg/dL — ABNORMAL HIGH (ref 70–99)
Potassium: 3.9 mEq/L (ref 3.5–5.3)
SODIUM: 136 meq/L (ref 135–145)
TOTAL PROTEIN: 6 g/dL (ref 6.0–8.3)
Total Bilirubin: 0.3 mg/dL (ref 0.2–1.2)

## 2013-12-30 MED ORDER — ALPRAZOLAM 0.25 MG PO TABS: ORAL_TABLET | ORAL | Status: DC

## 2013-12-30 MED ORDER — SUMATRIPTAN SUCCINATE 100 MG PO TABS: ORAL_TABLET | ORAL | Status: DC

## 2013-12-30 MED ORDER — ZOLPIDEM TARTRATE 10 MG PO TABS: ORAL_TABLET | ORAL | Status: DC

## 2013-12-30 NOTE — Patient Instructions (Signed)
Please get the pharmacy to give you the shingles vaccine and send me the documentation. Please schedule your mammogram.

## 2013-12-30 NOTE — Progress Notes (Signed)
Subjective:    Patient ID: Amy [name], female    DOB: Jul 31, 1951, 63 y.o.   MRN: [id number]   PCP: Leandrew Koyanagi, MD  Chief Complaint  Patient presents with  . Follow-up    6 month  . Medication Refill    Medications, allergies, past medical history, surgical history, family history, social history and problem list reviewed and updated.  Patient Active Problem List   Diagnosis Date Noted  . HTN (hypertension) 12/24/2012  . Migraine 12/24/2012  . GERD (gastroesophageal reflux disease) 12/24/2012  . Hearing loss in left ear 12/24/2012  . 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 Insomnia 12/24/2012  . Diverticulosis 12/24/2012  . Personal history of colonic polyps 12/24/2012  . Hyperlipidemia 12/24/2012  . BCC (basal cell carcinoma), arm 12/24/2012   Prior to Admission medications   Medication Sig Start Date End Date Taking? Authorizing Provider  ALPRAZolam (XANAX) 0.25 MG tablet TAKE 1 TABLET BY MOUTH TWICE A DAY AS NEEDED FOR ANXIETY 10/06/13  Yes Rasheida Broden S Jalaiyah Throgmorton, PA-C  citalopram (CELEXA) 40 MG tablet TAKE 1 TABLET (40 MG TOTAL) BY MOUTH DAILY.   Yes Lorianne Malbrough S Livi Mcgann, PA-C  fluticasone (FLONASE) 50 MCG/ACT nasal spray APPLY 2 SPRAY IN BOTH NOSTRILS GIVEN ONCE A DAY FOR 30 DAYS AS NEEDED. 06/15/13  Yes Toluwanimi Radebaugh S Autumne Kallio, PA-C  HYDROcodone-acetaminophen (NORCO) 5-325 MG per tablet Take 1 tablet by mouth every 4 (four) hours as needed. For pain 01/23/12  Yes Historical Provider, MD  JUBLIA 10 % SOLN  12/15/13  Yes Historical Provider, MD  lansoprazole (PREVACID SOLUTAB) 30 MG disintegrating tablet TAKE 1 TABLET BY MOUTH DAILY. 07/01/13  Yes Maya Scholer S Sameerah Nachtigal, PA-C  lisinopril (PRINIVIL,ZESTRIL) 5 MG tablet Take 1 tablet (5 mg total) by mouth daily. 07/01/13  Yes Macarthur Lorusso S Amazin Pincock, PA-C  metoprolol succinate (TOPROL-XL) 100 MG 24 hr tablet Take 1 tablet (100 mg total) by mouth daily. Take with or immediately following a meal. 07/01/13  Yes Jaquana Geiger S Kellina Dreese, PA-C  PREMARIN 1.25 MG  tablet TAKE 1 TABLET (1.25 MG TOTAL) BY MOUTH DAILY. 07/17/13  Yes Lahna Nath S Starlina Lapre, PA-C  SUMAtriptan (IMITREX) 100 MG tablet TAKE 1 TAB EVERY 2 HOURS AS NEEDED 07/06/13  Yes Munirah Doerner S Kesia Dalto, PA-C  verapamil (VERELAN PM) 180 MG 24 hr capsule TAKE 1 CAPSULE (180 MG TOTAL) BY MOUTH 2 (TWO) TIMES DAILY. 10/26/13  Yes Mancel Bale, PA-C  zolpidem (AMBIEN) 10 MG tablet TAKE 1 TABLET AT BEDTIME 12/09/13  Yes Serapio Edelson Janalee Dane, PA-C    HPI  Has a new granddaughter. Has not had a mammogram yet-ordered 12/2012 and 06/2013.  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  Review of Systems Denies chest pain, shortness of breath, HA, dizziness, vision change, nausea, vomiting, diarrhea, constipation, melena, hematochezia, dysuria, increased urinary urgency or frequency, increased hunger or thirst, unintentional weight change, unexplained myalgias or arthralgias, rash.  She has LEFT sided facial palsy resulting from the resection of the meningioma.     Objective:   Physical Exam  Vitals reviewed. Constitutional: She is oriented to person, place, and time. Vital signs are normal. She appears well-developed and well-nourished. She is active and cooperative. No distress.  BP 122/85  Pulse 90  Temp(Src) 98.5 F (36.9 C)  Resp 16  Ht 5\' 5"  (1.651 m)  Wt 153 lb (69.4 kg)  BMI 25.46 kg/m2  SpO2 97%  HENT:  Head: Normocephalic and atraumatic.  Right Ear: Hearing normal.  Left  Ear: Hearing normal.  Eyes: Conjunctivae are normal. No scleral icterus.  Neck: Normal range of motion. Neck supple. No thyromegaly present.  Cardiovascular: Normal rate, regular rhythm and normal heart sounds.   Pulses:      Radial pulses are 2+ on the right side, and 2+ on the left side.  Pulmonary/Chest: Effort normal and breath sounds normal.  Lymphadenopathy:       Head (right side): No tonsillar, no preauricular, no posterior auricular and no  occipital adenopathy present.       Head (left side): No tonsillar, no preauricular, no posterior auricular and no occipital adenopathy present.    She has no cervical adenopathy.       Right: No supraclavicular adenopathy present.       Left: No supraclavicular adenopathy present.  Neurological: She is alert and oriented to person, place, and time. No sensory deficit.  Skin: Skin is warm, dry and intact. No rash noted. No cyanosis or erythema. Nails show no clubbing.  Psychiatric: She has a normal mood and affect.      Assessment & Plan:  1. HTN (hypertension) Controlled.  Continue current treatment. - COMPLETE METABOLIC PANEL WITH GFR  2. Hyperlipidemia Await labs. - Lipid panel  3. Need for hepatitis C screening test - Hepatitis C antibody  4. Migraine Stable. Continue current treatment. - SUMAtriptan (IMITREX) 100 MG tablet; TAKE 1 TAB EVERY 2 HOURS AS NEEDED  Dispense: 10 tablet; Refill: 3  5. Insomnia Stable. Continue current treatment. - zolpidem (AMBIEN) 10 MG tablet; TAKE 1 TABLET AT BEDTIME  Dispense: 30 tablet; Refill: 0  6. Anxiety and depression Stable. Continue current treatment. - ALPRAZolam (XANAX) 0.25 MG tablet; TAKE 1 TABLET BY MOUTH TWICE A DAY AS NEEDED FOR ANXIETY  Dispense: 60 tablet; Refill: 0  Return in about 6 months (around 07/02/2014) for re-evaluation and fasting labs.   Fara Chute, PA-C Physician Assistant-Certified [facility]

## 2013-12-31 LAB — HEPATITIS C ANTIBODY: HCV Ab: NEGATIVE

## 2014-01-04 NOTE — Addendum Note (Signed)
Addended by: Fara Chute on: 01/04/2014 10:07 AM   Modules accepted: Orders

## 2014-01-06 ENCOUNTER — Other Ambulatory Visit (INDEPENDENT_AMBULATORY_CARE_PROVIDER_SITE_OTHER): Payer: Medicare Other

## 2014-01-06 VITALS — BP 138/88 | HR 88 | Temp 97.9°F | Resp 16 | Ht 65.0 in | Wt 152.0 lb

## 2014-01-06 DIAGNOSIS — R739 Hyperglycemia, unspecified: Secondary | ICD-10-CM

## 2014-01-06 DIAGNOSIS — R7309 Other abnormal glucose: Secondary | ICD-10-CM

## 2014-01-06 LAB — GLUCOSE, POCT (MANUAL RESULT ENTRY): POC Glucose: 105 mg/dl — AB (ref 70–99)

## 2014-01-06 LAB — POCT GLYCOSYLATED HEMOGLOBIN (HGB A1C): Hemoglobin A1C: 5.3

## 2014-01-06 NOTE — Progress Notes (Signed)
Patient here for labs only. 

## 2014-01-13 DIAGNOSIS — M65979 Unspecified synovitis and tenosynovitis, unspecified ankle and foot: Secondary | ICD-10-CM | POA: Diagnosis not present

## 2014-01-13 DIAGNOSIS — G5760 Lesion of plantar nerve, unspecified lower limb: Secondary | ICD-10-CM | POA: Diagnosis not present

## 2014-01-13 DIAGNOSIS — M659 Synovitis and tenosynovitis, unspecified: Secondary | ICD-10-CM | POA: Diagnosis not present

## 2014-01-31 ENCOUNTER — Other Ambulatory Visit: Payer: Self-pay | Admitting: Physician Assistant

## 2014-02-02 NOTE — Telephone Encounter (Signed)
Faxed

## 2014-02-16 DIAGNOSIS — G5760 Lesion of plantar nerve, unspecified lower limb: Secondary | ICD-10-CM | POA: Diagnosis not present

## 2014-02-16 DIAGNOSIS — M659 Synovitis and tenosynovitis, unspecified: Secondary | ICD-10-CM | POA: Diagnosis not present

## 2014-02-16 DIAGNOSIS — M65979 Unspecified synovitis and tenosynovitis, unspecified ankle and foot: Secondary | ICD-10-CM | POA: Diagnosis not present

## 2014-02-21 ENCOUNTER — Other Ambulatory Visit: Payer: Self-pay | Admitting: Physician Assistant

## 2014-02-22 NOTE — Telephone Encounter (Signed)
Faxed

## 2014-02-24 DIAGNOSIS — L600 Ingrowing nail: Secondary | ICD-10-CM | POA: Diagnosis not present

## 2014-02-24 DIAGNOSIS — M79609 Pain in unspecified limb: Secondary | ICD-10-CM | POA: Diagnosis not present

## 2014-03-02 ENCOUNTER — Other Ambulatory Visit: Payer: Self-pay | Admitting: Physician Assistant

## 2014-03-04 NOTE — Telephone Encounter (Signed)
Faxed

## 2014-03-07 DIAGNOSIS — D333 Benign neoplasm of cranial nerves: Secondary | ICD-10-CM | POA: Diagnosis not present

## 2014-03-10 DIAGNOSIS — M65979 Unspecified synovitis and tenosynovitis, unspecified ankle and foot: Secondary | ICD-10-CM | POA: Diagnosis not present

## 2014-03-10 DIAGNOSIS — G5760 Lesion of plantar nerve, unspecified lower limb: Secondary | ICD-10-CM | POA: Diagnosis not present

## 2014-03-10 DIAGNOSIS — M659 Synovitis and tenosynovitis, unspecified: Secondary | ICD-10-CM | POA: Diagnosis not present

## 2014-03-18 DIAGNOSIS — M659 Synovitis and tenosynovitis, unspecified: Secondary | ICD-10-CM | POA: Diagnosis not present

## 2014-03-18 DIAGNOSIS — M65979 Unspecified synovitis and tenosynovitis, unspecified ankle and foot: Secondary | ICD-10-CM | POA: Diagnosis not present

## 2014-03-18 DIAGNOSIS — G5760 Lesion of plantar nerve, unspecified lower limb: Secondary | ICD-10-CM | POA: Diagnosis not present

## 2014-04-03 ENCOUNTER — Other Ambulatory Visit: Payer: Self-pay | Admitting: Physician Assistant

## 2014-04-04 ENCOUNTER — Telehealth: Payer: Self-pay

## 2014-04-04 NOTE — Telephone Encounter (Signed)
Patients spouse called to see if our office was notified by [facility] for a refill on "Zolpidem". Per spouse patient is completely out. Patients call back number is [phone]

## 2014-04-04 NOTE — Telephone Encounter (Signed)
Faxed rx. Notified pt on VM.

## 2014-04-04 NOTE — Telephone Encounter (Signed)
Pt's husband just called in stating that [name] had just called and left a message stating that the RX for zolpidem (AMBIEN) 10 MG tablet had just been called into [facility] [place].He stated that he did not think their electric RX machine was working and wanted someone to send a regular fax over to them with the RX so that it could get filled because his wife [name] was out completely.  His call back number is [phone]

## 2014-04-05 NOTE — Telephone Encounter (Signed)
rx was picked up yesterday

## 2014-04-08 ENCOUNTER — Other Ambulatory Visit: Payer: Self-pay | Admitting: Physician Assistant

## 2014-04-11 ENCOUNTER — Other Ambulatory Visit: Payer: Self-pay | Admitting: Physician Assistant

## 2014-04-12 ENCOUNTER — Other Ambulatory Visit: Payer: Self-pay | Admitting: Physician Assistant

## 2014-04-12 NOTE — Telephone Encounter (Signed)
Faxed

## 2014-04-13 DIAGNOSIS — M65979 Unspecified synovitis and tenosynovitis, unspecified ankle and foot: Secondary | ICD-10-CM | POA: Diagnosis not present

## 2014-04-13 DIAGNOSIS — M659 Synovitis and tenosynovitis, unspecified: Secondary | ICD-10-CM | POA: Diagnosis not present

## 2014-04-13 DIAGNOSIS — G5760 Lesion of plantar nerve, unspecified lower limb: Secondary | ICD-10-CM | POA: Diagnosis not present

## 2014-04-27 DIAGNOSIS — G5760 Lesion of plantar nerve, unspecified lower limb: Secondary | ICD-10-CM | POA: Diagnosis not present

## 2014-04-27 DIAGNOSIS — M659 Synovitis and tenosynovitis, unspecified: Secondary | ICD-10-CM | POA: Diagnosis not present

## 2014-04-27 DIAGNOSIS — M65979 Unspecified synovitis and tenosynovitis, unspecified ankle and foot: Secondary | ICD-10-CM | POA: Diagnosis not present

## 2014-04-27 DIAGNOSIS — L600 Ingrowing nail: Secondary | ICD-10-CM | POA: Diagnosis not present

## 2014-05-02 ENCOUNTER — Other Ambulatory Visit: Payer: Self-pay | Admitting: Physician Assistant

## 2014-05-03 ENCOUNTER — Telehealth: Payer: Self-pay | Admitting: Physician Assistant

## 2014-05-03 NOTE — Telephone Encounter (Signed)
Refill on on Ambien. States that the pharmacy sent a request however she would prefer to pick it up from the office instead.   [phone]

## 2014-05-03 NOTE — Telephone Encounter (Signed)
Pt went to the pharmacy to pick up prescription- pharmacy called and this request has not yet been reviewed. She has been advised.

## 2014-05-04 DIAGNOSIS — M65979 Unspecified synovitis and tenosynovitis, unspecified ankle and foot: Secondary | ICD-10-CM | POA: Diagnosis not present

## 2014-05-04 DIAGNOSIS — G5760 Lesion of plantar nerve, unspecified lower limb: Secondary | ICD-10-CM | POA: Diagnosis not present

## 2014-05-04 DIAGNOSIS — M659 Synovitis and tenosynovitis, unspecified: Secondary | ICD-10-CM | POA: Diagnosis not present

## 2014-05-04 NOTE — Telephone Encounter (Signed)
Chelle - Pt's husband called again about Ambien refill.  Says wife is completely out.

## 2014-05-12 ENCOUNTER — Other Ambulatory Visit: Payer: Self-pay | Admitting: Physician Assistant

## 2014-05-14 NOTE — Telephone Encounter (Signed)
Patient called requesting a refill on Premarin 1.25 mg. [facility] for a refill on 05/12/2014. No response yet. Patient is completely out of the medication. [facility] [place] [facility] [address] [phone]

## 2014-05-14 NOTE — Telephone Encounter (Signed)
Patient called back to check status of her "Premarin 1.25mg " being called [facility] [place]. I informed patient it was still being processed and to allow more time. Patient stated she is completely out and hopes our office will be able to process it today. Patients call back number is [phone]

## 2014-05-15 NOTE — Telephone Encounter (Signed)
[facility] [place] calling to get patients prescription for "Premarin" authorized. Per pharmacy patient and her husband has called several times. [facility] call [facility] at [phone]

## 2014-05-19 ENCOUNTER — Other Ambulatory Visit: Payer: Self-pay | Admitting: Physician Assistant

## 2014-05-20 DIAGNOSIS — D333 Benign neoplasm of cranial nerves: Secondary | ICD-10-CM | POA: Diagnosis not present

## 2014-05-20 DIAGNOSIS — H9190 Unspecified hearing loss, unspecified ear: Secondary | ICD-10-CM | POA: Diagnosis not present

## 2014-05-20 NOTE — Telephone Encounter (Signed)
Faxed

## 2014-05-20 NOTE — Telephone Encounter (Signed)
Done

## 2014-05-22 DIAGNOSIS — Z23 Encounter for immunization: Secondary | ICD-10-CM | POA: Diagnosis not present

## 2014-06-01 ENCOUNTER — Other Ambulatory Visit: Payer: Self-pay | Admitting: Internal Medicine

## 2014-06-03 DIAGNOSIS — J0100 Acute maxillary sinusitis, unspecified: Secondary | ICD-10-CM | POA: Diagnosis not present

## 2014-06-03 NOTE — Telephone Encounter (Signed)
[name], husband called again and operator explained that the message keeps getting pulled out of Amy [name]'s in basket when they call and this has slowed the process down. Can you please review in Amy [name]'s absence?

## 2014-06-03 NOTE — Telephone Encounter (Signed)
The patient's husband called for another update on her refill request for zolpidem.  He appeared agitated that the refill was not submitted yet.  CB#: [phone]

## 2014-06-03 NOTE — Telephone Encounter (Signed)
Patient husband called requesting a refill for his wife "Zolpidem". Per spouse she is completely out. [facility] [place] Patients call back number is [phone]

## 2014-06-03 NOTE — Telephone Encounter (Signed)
Done

## 2014-06-03 NOTE — Telephone Encounter (Signed)
Patient spouse calling back to check status again for zolpidem for his wife. Please call when ready patient says that prescription request was sent 48 hours ago and is upset.

## 2014-06-03 NOTE — Telephone Encounter (Signed)
Called in and pt notified.

## 2014-06-21 ENCOUNTER — Other Ambulatory Visit: Payer: Self-pay | Admitting: Physician Assistant

## 2014-06-22 NOTE — Telephone Encounter (Signed)
rx printed.  Meds ordered this encounter  Medications  . ALPRAZolam (XANAX) 0.25 MG tablet    Sig: TAKE 1 TABLET TWICE A DAY AS NEEDED    Dispense:  60 tablet    Refill:  0    Not to exceed 5 additional fills before 11/16/2014

## 2014-06-23 NOTE — Telephone Encounter (Signed)
Called in.

## 2014-07-01 ENCOUNTER — Other Ambulatory Visit: Payer: Self-pay | Admitting: Physician Assistant

## 2014-07-01 NOTE — Telephone Encounter (Signed)
Forward to who wrote this-probably Chelle

## 2014-07-02 NOTE — Telephone Encounter (Signed)
Patient's husband called and states that the patient says that Chelle left a message on file stating that she should be able to get this refilled until February. Can someone else fill this for her?

## 2014-07-03 NOTE — Telephone Encounter (Signed)
Ok Meds ordered this encounter  Medications  . zolpidem (AMBIEN) 10 MG tablet    Sig: TAKE 1 TABLET AT BEDTIME    Dispense:  30 tablet    Refill:  2   F/u chelle in feb

## 2014-07-03 NOTE — Telephone Encounter (Signed)
Called in.

## 2014-07-05 ENCOUNTER — Ambulatory Visit: Payer: BC Managed Care – PPO | Admitting: Physician Assistant

## 2014-07-30 ENCOUNTER — Telehealth: Payer: Self-pay | Admitting: Internal Medicine

## 2014-07-31 NOTE — Telephone Encounter (Signed)
Patients husband called to check on status of refill request.  States Chelle has authorized others to refill in her absence.  She needs her Ambien to Sleep.     [place]

## 2014-08-01 NOTE — Telephone Encounter (Signed)
Refills available at the pharmacy. Confirmed with pharmacist. Pt advised.

## 2014-08-03 ENCOUNTER — Other Ambulatory Visit: Payer: Self-pay | Admitting: Physician Assistant

## 2014-08-03 NOTE — Telephone Encounter (Signed)
Faxed

## 2014-08-03 NOTE — Telephone Encounter (Signed)
Pt has appt sch in Jan

## 2014-08-10 ENCOUNTER — Other Ambulatory Visit: Payer: Self-pay | Admitting: Physician Assistant

## 2014-08-12 DIAGNOSIS — C50912 Malignant neoplasm of unspecified site of left female breast: Secondary | ICD-10-CM

## 2014-08-12 HISTORY — PX: BREAST BIOPSY: SHX20

## 2014-08-12 HISTORY — DX: Malignant neoplasm of unspecified site of left female breast: C50.912

## 2014-08-15 ENCOUNTER — Other Ambulatory Visit: Payer: Self-pay | Admitting: Physician Assistant

## 2014-08-15 DIAGNOSIS — Z1231 Encounter for screening mammogram for malignant neoplasm of breast: Secondary | ICD-10-CM

## 2014-08-16 ENCOUNTER — Other Ambulatory Visit: Payer: Self-pay | Admitting: Family Medicine

## 2014-08-16 ENCOUNTER — Encounter: Payer: Self-pay | Admitting: Physician Assistant

## 2014-08-16 ENCOUNTER — Ambulatory Visit (INDEPENDENT_AMBULATORY_CARE_PROVIDER_SITE_OTHER): Payer: BLUE CROSS/BLUE SHIELD | Admitting: Physician Assistant

## 2014-08-16 ENCOUNTER — Telehealth: Payer: Self-pay | Admitting: Family Medicine

## 2014-08-16 VITALS — BP 152/93 | HR 78 | Temp 97.8°F | Resp 16 | Ht 65.5 in | Wt 156.0 lb

## 2014-08-16 DIAGNOSIS — R739 Hyperglycemia, unspecified: Secondary | ICD-10-CM

## 2014-08-16 DIAGNOSIS — I10 Essential (primary) hypertension: Secondary | ICD-10-CM

## 2014-08-16 DIAGNOSIS — F418 Other specified anxiety disorders: Secondary | ICD-10-CM

## 2014-08-16 DIAGNOSIS — G4700 Insomnia, unspecified: Secondary | ICD-10-CM

## 2014-08-16 DIAGNOSIS — J309 Allergic rhinitis, unspecified: Secondary | ICD-10-CM

## 2014-08-16 DIAGNOSIS — Z79890 Hormone replacement therapy (postmenopausal): Secondary | ICD-10-CM

## 2014-08-16 DIAGNOSIS — E785 Hyperlipidemia, unspecified: Secondary | ICD-10-CM

## 2014-08-16 DIAGNOSIS — F419 Anxiety disorder, unspecified: Secondary | ICD-10-CM

## 2014-08-16 DIAGNOSIS — F32A Depression, unspecified: Secondary | ICD-10-CM

## 2014-08-16 DIAGNOSIS — F329 Major depressive disorder, single episode, unspecified: Secondary | ICD-10-CM

## 2014-08-16 DIAGNOSIS — G43809 Other migraine, not intractable, without status migrainosus: Secondary | ICD-10-CM

## 2014-08-16 LAB — COMPREHENSIVE METABOLIC PANEL
ALBUMIN: 3.9 g/dL (ref 3.5–5.2)
ALT: 11 U/L (ref 0–35)
AST: 15 U/L (ref 0–37)
Alkaline Phosphatase: 32 U/L — ABNORMAL LOW (ref 39–117)
BUN: 9 mg/dL (ref 6–23)
CHLORIDE: 103 meq/L (ref 96–112)
CO2: 27 meq/L (ref 19–32)
Calcium: 9.2 mg/dL (ref 8.4–10.5)
Creat: 0.56 mg/dL (ref 0.50–1.10)
Glucose, Bld: 88 mg/dL (ref 70–99)
Potassium: 4.6 mEq/L (ref 3.5–5.3)
SODIUM: 138 meq/L (ref 135–145)
TOTAL PROTEIN: 6.3 g/dL (ref 6.0–8.3)
Total Bilirubin: 0.4 mg/dL (ref 0.2–1.2)

## 2014-08-16 LAB — GLUCOSE, POCT (MANUAL RESULT ENTRY): POC Glucose: 100 mg/dl — AB (ref 70–99)

## 2014-08-16 LAB — LIPID PANEL
Cholesterol: 203 mg/dL — ABNORMAL HIGH (ref 0–200)
HDL: 61 mg/dL (ref >39)
LDL CALC: 115 mg/dL — AB (ref 0–99)
Total CHOL/HDL Ratio: 3.3 Ratio
Triglycerides: 135 mg/dL (ref <150)
VLDL: 27 mg/dL (ref 0–40)

## 2014-08-16 LAB — POCT GLYCOSYLATED HEMOGLOBIN (HGB A1C): Hemoglobin A1C: 5.5

## 2014-08-16 MED ORDER — CITALOPRAM HYDROBROMIDE 40 MG PO TABS: ORAL_TABLET | ORAL | Status: DC

## 2014-08-16 MED ORDER — METOPROLOL SUCCINATE ER 100 MG PO TB24: 100.0000 mg | ORAL_TABLET | Freq: Every day | ORAL | Status: DC

## 2014-08-16 MED ORDER — ESTROGENS CONJUGATED 1.25 MG PO TABS: ORAL_TABLET | ORAL | Status: DC

## 2014-08-16 MED ORDER — SUMATRIPTAN SUCCINATE 100 MG PO TABS: ORAL_TABLET | ORAL | Status: DC

## 2014-08-16 MED ORDER — ALPRAZOLAM 0.25 MG PO TABS: 0.2500 mg | ORAL_TABLET | Freq: Two times a day (BID) | ORAL | Status: DC | PRN

## 2014-08-16 MED ORDER — LISINOPRIL 5 MG PO TABS: 5.0000 mg | ORAL_TABLET | Freq: Every day | ORAL | Status: DC

## 2014-08-16 MED ORDER — FLUTICASONE PROPIONATE 50 MCG/ACT NA SUSP: NASAL | Status: DC

## 2014-08-16 MED ORDER — VERAPAMIL HCL ER 180 MG PO CP24: ORAL_CAPSULE | ORAL | Status: DC

## 2014-08-16 NOTE — Telephone Encounter (Signed)
Spoke with Ashely,Called in Xanax 0.25 mg #60.

## 2014-08-16 NOTE — Progress Notes (Signed)
Subjective:    Patient ID: Amy [name], female    DOB: 09-10-1950, 64 y.o.   MRN: [id number]   PCP: Leandrew Koyanagi, MD  Chief Complaint  Patient presents with  . Medication Refill    Allergies  Allergen Reactions  . Codeine Other (See Comments)    Nerves; depends on dose  . Darvocet [Propoxyphene N-Acetaminophen] Other (See Comments)    pruritis  . Gold-Containing Drug Products   . Tramadol Other (See Comments)    Chest tightness  . Sulfa Antibiotics Rash    Patient Active Problem List   Diagnosis Date Noted  . HTN (hypertension) 12/24/2012  . Migraine 12/24/2012  . GERD (gastroesophageal reflux disease) 12/24/2012  . Hearing loss in left ear 12/24/2012  . 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 Insomnia 12/24/2012  . Diverticulosis 12/24/2012  . Personal history of colonic polyps 12/24/2012  . Hyperlipidemia 12/24/2012  . BCC (basal cell carcinoma), arm 12/24/2012    Prior to Admission medications   Medication Sig Start Date End Date Taking? Authorizing Provider  ALPRAZolam Duanne Moron) 0.25 MG tablet TAKE 1 TABLET BY MOUTH TWICE A DAY AS NEEDED 08/03/14  Yes Jeanell Mangan S Shaquil Aldana, PA-C  citalopram (CELEXA) 40 MG tablet TAKE 1 TABLET (40 MG TOTAL) BY MOUTH DAILY.   Yes Samarth Ogle S Kerline Trahan, PA-C  estrogens, conjugated, (PREMARIN) 1.25 MG tablet TAKE 1 TABLET (1.25 MG TOTAL) BY MOUTH DAILY 08/12/14  Yes Mikyah Alamo S Celsey Asselin, PA-C  fluticasone (FLONASE) 50 MCG/ACT nasal spray APPLY 2 SPRAY IN BOTH NOSTRILS GIVEN ONCE A DAY FOR 30 DAYS AS NEEDED. 06/15/13  Yes Adaria Hole S Haylen Shelnutt, PA-C  HYDROcodone-acetaminophen (NORCO) 5-325 MG per tablet Take 1 tablet by mouth every 4 (four) hours as needed. For pain 01/23/12  Yes Historical Provider, MD  lansoprazole (PREVACID SOLUTAB) 30 MG disintegrating tablet TAKE 1 TABLET BY MOUTH DAILY. 07/01/13  Yes Zian Mohamed S Jacqulynn Cadet, PA-C  LIDODERM 5 %  07/19/14  Yes Historical Provider, MD  lisinopril (PRINIVIL,ZESTRIL) 5 MG tablet Take 1 tablet (5 mg total) by mouth  daily. 07/01/13  Yes Crissa Sowder S Safiyyah Vasconez, PA-C  metoprolol succinate (TOPROL-XL) 100 MG 24 hr tablet Take 1 tablet (100 mg total) by mouth daily. Take with or immediately following a meal. 07/01/13  Yes Yailene Badia S Cherae Marton, PA-C  verapamil (VERELAN PM) 180 MG 24 hr capsule TAKE 1 CAPSULE (180 MG TOTAL) BY MOUTH 2 (TWO) TIMES DAILY. 10/26/13  Yes Mancel Bale, PA-C  zolpidem (AMBIEN) 10 MG tablet TAKE 1 TABLET AT BEDTIME 07/03/14  Yes Leandrew Koyanagi, MD  SUMAtriptan (IMITREX) 100 MG tablet TAKE 1 TAB EVERY 2 HOURS AS NEEDED Patient not taking: Reported on 08/16/2014 12/30/13   Fara Chute, PA-C    Medical, Surgical, Family and Social History reviewed and updated.  HPI  Presents for medication refills. Generally doing well, without concerns/complaints. Is pleased that her hair is growing back, and she's shortened it all over to make it even with the sorter LEFT side.  She also needs to have some fasting labs updated to assess hyperlipidemia, hyperglycemia and HTN.  Never got Zostavax injection-needs new Rx.  Review of Systems No chest pain, SOB, HA, dizziness, vision change, N/V, diarrhea, constipation, dysuria, urinary urgency or frequency, myalgias, arthralgias or rash.     Objective:   Physical Exam  Constitutional: She is oriented to person, place, and time. She appears well-developed and well-nourished. No distress.  BP 152/93 mmHg  Pulse 78  Temp(Src) 97.8 F (36.6 C) (Oral)  Resp  16  Ht 5' 5.5" (1.664 m)  Wt 156 lb (70.761 kg)  BMI 25.56 kg/m2  SpO2 96%   Eyes: Conjunctivae are normal. No scleral icterus.  Neck: No thyromegaly present.  Cardiovascular: Normal rate, regular rhythm, normal heart sounds and intact distal pulses.   Pulmonary/Chest: Effort normal and breath sounds normal.  Lymphadenopathy:    She has no cervical adenopathy.  Neurological: She is alert and oriented to person, place, and time.  LEFT sided facial palsy unchanged from last visit.  Skin: Skin  is warm and dry.  Psychiatric: She has a normal mood and affect. Her behavior is normal.   Results for orders placed or performed in visit on 08/16/14  POCT glucose (manual entry)  Result Value Ref Range   POC Glucose 100 (A) 70 - 99 mg/dl  POCT glycosylated hemoglobin (Hb A1C)  Result Value Ref Range   Hemoglobin A1C 5.5          Assessment & Plan:  1. Essential hypertension Above goal today-has been well controlled previously. Continue current treatment and monitor. Anticipate return to good control. If remains elevated, plan increase lisinopril dose. - metoprolol succinate (TOPROL-XL) 100 MG 24 hr tablet; Take 1 tablet (100 mg total) by mouth daily. Take with or immediately following a meal.  Dispense: 90 tablet; Refill: 3 - verapamil (VERELAN PM) 180 MG 24 hr capsule; TAKE 1 CAPSULE (180 MG TOTAL) BY MOUTH 2 (TWO) TIMES DAILY.  Dispense: 180 capsule; Refill: 3 - lisinopril (PRINIVIL,ZESTRIL) 5 MG tablet; Take 1 tablet (5 mg total) by mouth daily.  Dispense: 90 tablet; Refill: 3 - Comprehensive metabolic panel  2. Other migraine without status migrainosus, not intractable Good control. Continue PRN Imitrex. - SUMAtriptan (IMITREX) 100 MG tablet; TAKE 1 TAB EVERY 2 HOURS AS NEEDED  Dispense: 10 tablet; Refill: 3  3. Insomnia Due to anxiety and depression. Stable. - ALPRAZolam (XANAX) 0.25 MG tablet; Take 1 tablet (0.25 mg total) by mouth 2 (two) times daily as needed.  Dispense: 60 tablet; Refill: 0  4. Anxiety and depression Stable. Continue citalopram and PRN alprazolam. - citalopram (CELEXA) 40 MG tablet; TAKE 1 TABLET (40 MG TOTAL) BY MOUTH DAILY.  Dispense: 90 tablet; Refill: 3  5. Allergic rhinitis, unspecified allergic rhinitis type Controlled. - fluticasone (FLONASE) 50 MCG/ACT nasal spray; APPLY 2 SPRAY IN BOTH NOSTRILS GIVEN ONCE A DAY FOR 30 DAYS AS NEEDED.  Dispense: 16 g; Refill: 12  6. Postmenopausal HRT (hormone replacement therapy) Annual discussion of risks  and benefits.  Current benefits outweigh risks. Continue current dose.Discuss again in 1 year. - estrogens, conjugated, (PREMARIN) 1.25 MG tablet; TAKE 1 TABLET (1.25 MG TOTAL) BY MOUTH DAILY  Dispense: 90 tablet; Refill: 3  7. Hyperglycemia Continue efforts to make healthy eating choices and get regular exercise. Continue to monitor. - POCT glucose (manual entry) - POCT glycosylated hemoglobin (Hb A1C)  8. Hyperlipidemia Await labs. - Lipid panel  Return in about 6 months (around 02/14/2015).  Fara Chute, PA-C Physician Assistant-Certified [facility]

## 2014-08-16 NOTE — Telephone Encounter (Signed)
Pt notified that Rx was sent to pharmacy.  

## 2014-08-18 ENCOUNTER — Encounter: Payer: Self-pay | Admitting: Physician Assistant

## 2014-08-19 ENCOUNTER — Other Ambulatory Visit: Payer: Self-pay | Admitting: Physician Assistant

## 2014-08-23 ENCOUNTER — Ambulatory Visit
Admission: RE | Admit: 2014-08-23 | Discharge: 2014-08-23 | Disposition: A | Discharge status: Home / Self Care | Payer: BLUE CROSS/BLUE SHIELD | Source: Ambulatory Visit | Attending: Physician Assistant | Admitting: Physician Assistant

## 2014-08-23 DIAGNOSIS — Z1231 Encounter for screening mammogram for malignant neoplasm of breast: Secondary | ICD-10-CM

## 2014-08-25 ENCOUNTER — Other Ambulatory Visit: Payer: Self-pay | Admitting: Internal Medicine

## 2014-08-25 DIAGNOSIS — R928 Other abnormal and inconclusive findings on diagnostic imaging of breast: Secondary | ICD-10-CM

## 2014-08-28 ENCOUNTER — Other Ambulatory Visit: Payer: Self-pay | Admitting: Physician Assistant

## 2014-08-28 NOTE — Telephone Encounter (Signed)
Pt just had a Rx written on 1/5 for #60 to be used bid that she should have at home with her.  Just a reminder she had her Xanax filled on 12/23 so she should not be out yet anyway.

## 2014-08-28 NOTE — Telephone Encounter (Signed)
[facility], so rx from the 1/5 sent in.

## 2014-08-28 NOTE — Telephone Encounter (Signed)
Patients husband is calling for a refill on xanax - they thought it was to be refilled automatically.   Patient is scheduled for MRI tomorrow and is anxious.   [phone]

## 2014-08-29 DIAGNOSIS — D333 Benign neoplasm of cranial nerves: Secondary | ICD-10-CM | POA: Diagnosis not present

## 2014-08-29 DIAGNOSIS — D329 Benign neoplasm of meninges, unspecified: Secondary | ICD-10-CM | POA: Diagnosis not present

## 2014-08-29 IMAGING — CR DG CHEST 2V
2 series · 2 of 2 positions shown · non-contrast
Comparison: Chest x-ray 05/24/2009.

CLINICAL DATA: Preoperative evaluation for knee surgery.

CHEST - 2 VIEW

[view not recorded (1 of 2)]
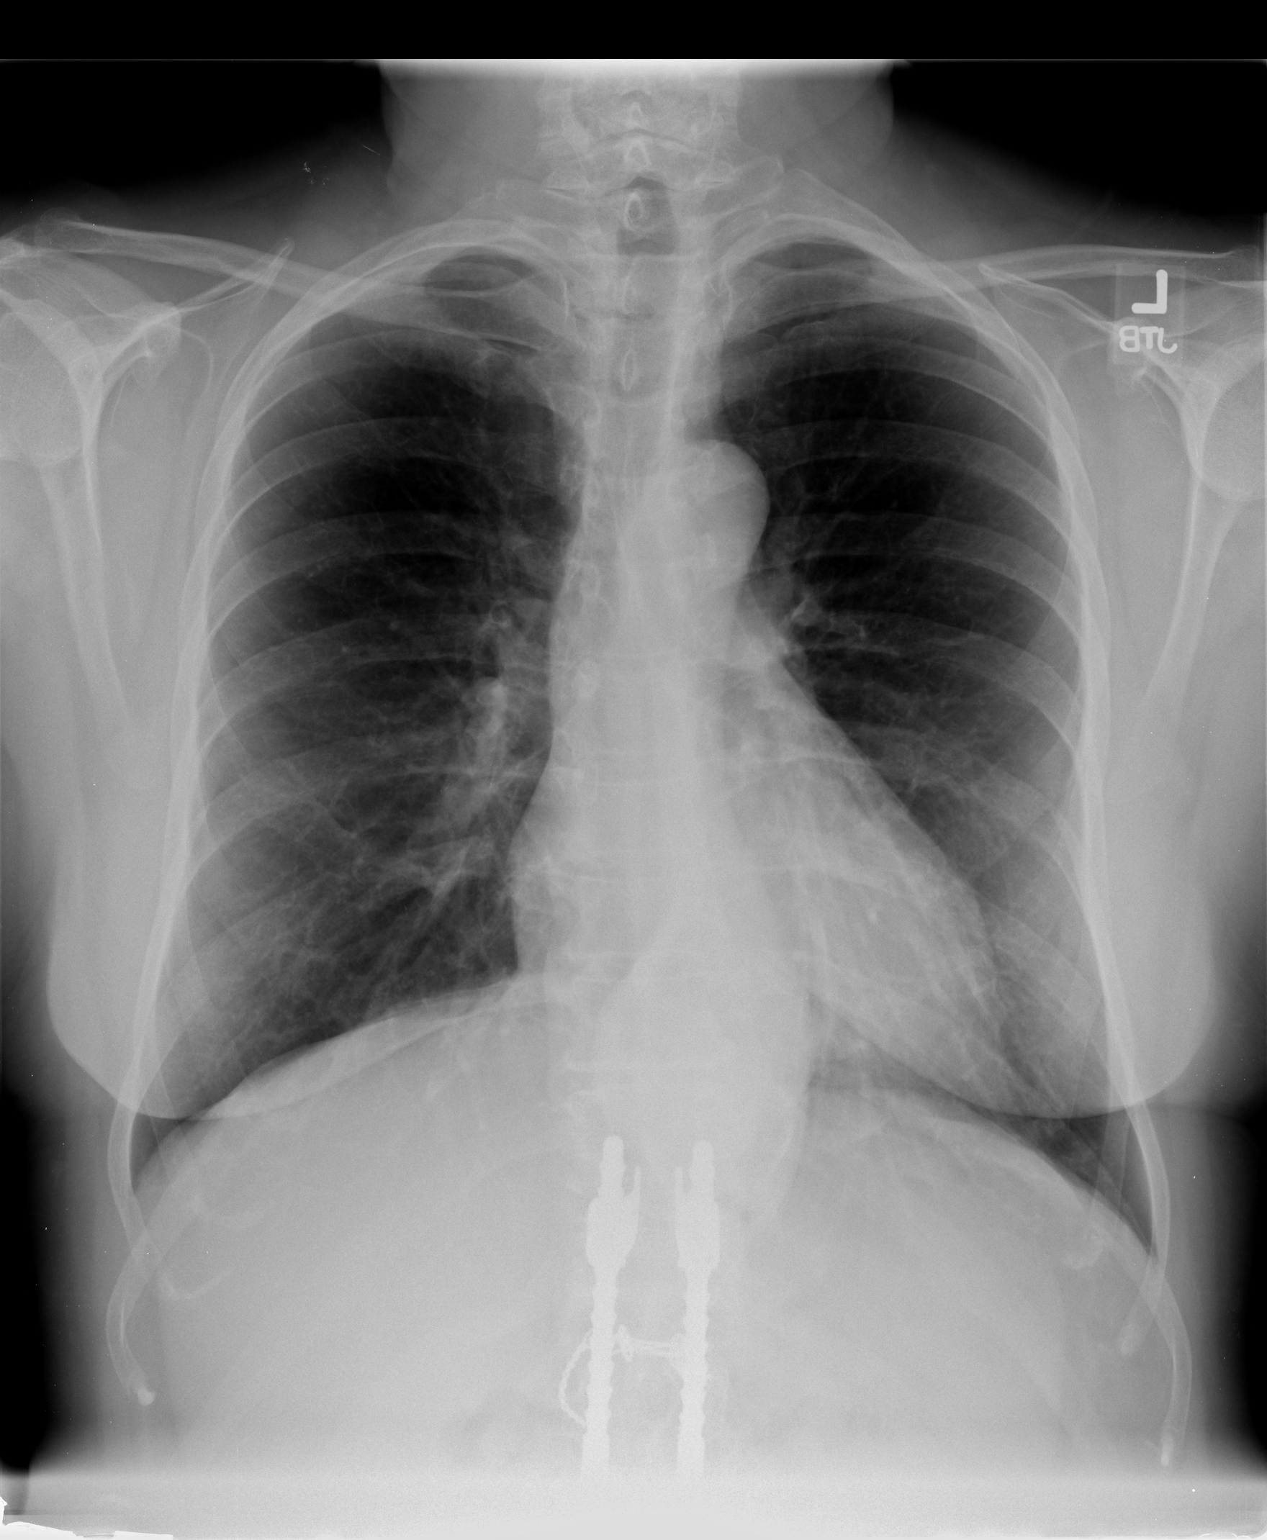

[view not recorded (2 of 2)]
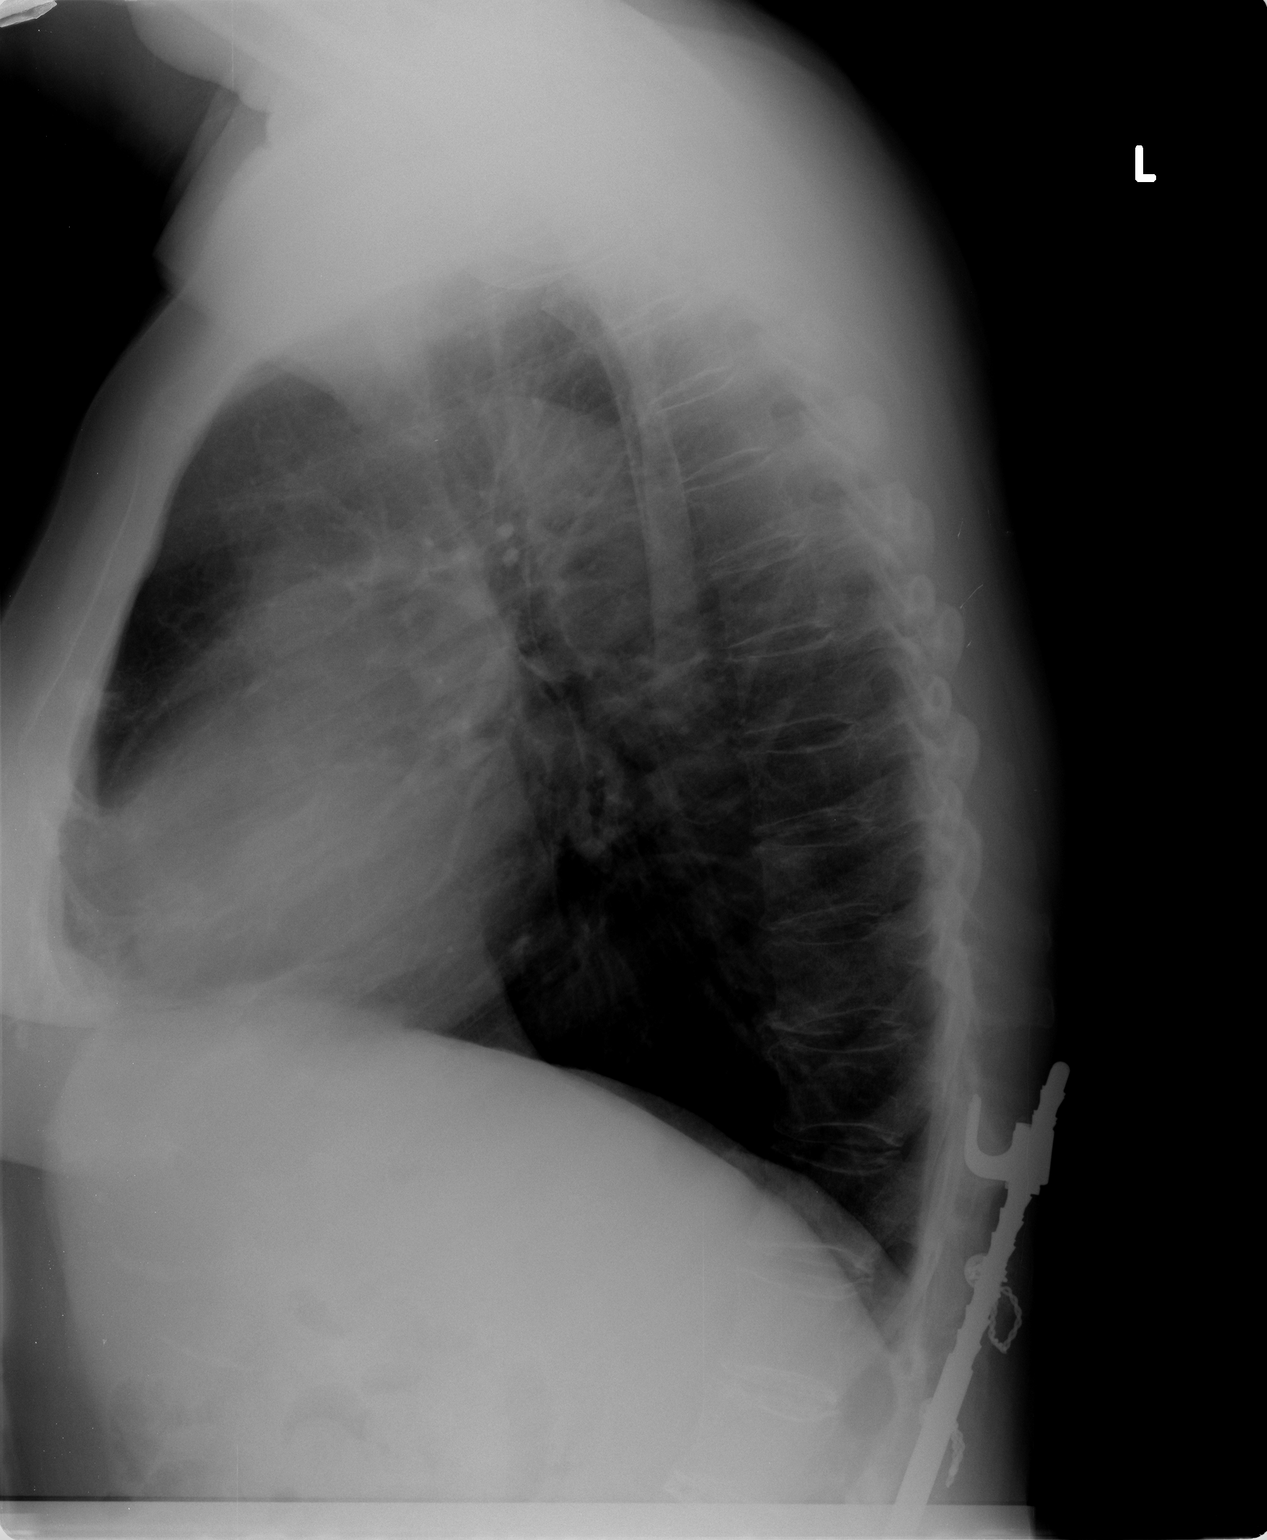

[2 of 2 positions shown; findings below may reference images not displayed]

FINDINGS: Lung volumes are normal.  No consolidative airspace
disease.  No pleural effusions.  Pulmonary vasculature is normal.
Heart size is normal.  Upper mediastinal contours are unremarkable.
In the lower mediastinum there is increased retrocardiac density,
favored to represent a small hiatal hernia.  Orthopedic fixation
hardware within the lumbar spine is incompletely imaged.
IMPRESSION: 1.  No radiographic evidence of acute cardiopulmonary disease.
2.  Probable small hiatal hernia.

## 2014-08-30 ENCOUNTER — Ambulatory Visit
Admission: RE | Admit: 2014-08-30 | Discharge: 2014-08-30 | Disposition: A | Discharge status: Home / Self Care | Payer: Medicare Other | Source: Ambulatory Visit | Attending: Internal Medicine | Admitting: Internal Medicine

## 2014-08-30 ENCOUNTER — Other Ambulatory Visit: Payer: Self-pay | Admitting: Internal Medicine

## 2014-08-30 ENCOUNTER — Ambulatory Visit: Payer: BLUE CROSS/BLUE SHIELD

## 2014-08-30 ENCOUNTER — Ambulatory Visit
Admission: RE | Admit: 2014-08-30 | Discharge: 2014-08-30 | Disposition: A | Discharge status: Home / Self Care | Payer: BLUE CROSS/BLUE SHIELD | Source: Ambulatory Visit | Attending: Internal Medicine | Admitting: Internal Medicine

## 2014-08-30 DIAGNOSIS — R928 Other abnormal and inconclusive findings on diagnostic imaging of breast: Secondary | ICD-10-CM

## 2014-09-01 ENCOUNTER — Other Ambulatory Visit: Payer: Self-pay | Admitting: Internal Medicine

## 2014-09-01 DIAGNOSIS — R928 Other abnormal and inconclusive findings on diagnostic imaging of breast: Secondary | ICD-10-CM

## 2014-09-06 ENCOUNTER — Ambulatory Visit
Admission: RE | Admit: 2014-09-06 | Discharge: 2014-09-06 | Disposition: A | Discharge status: Home / Self Care | Payer: BLUE CROSS/BLUE SHIELD | Source: Ambulatory Visit | Attending: Internal Medicine | Admitting: Internal Medicine

## 2014-09-06 DIAGNOSIS — R928 Other abnormal and inconclusive findings on diagnostic imaging of breast: Secondary | ICD-10-CM

## 2014-09-06 DIAGNOSIS — N63 Unspecified lump in breast: Secondary | ICD-10-CM | POA: Diagnosis not present

## 2014-09-07 ENCOUNTER — Telehealth: Payer: Self-pay

## 2014-09-07 ENCOUNTER — Encounter: Payer: Self-pay | Admitting: Physician Assistant

## 2014-09-07 ENCOUNTER — Other Ambulatory Visit: Payer: Self-pay | Admitting: Internal Medicine

## 2014-09-07 DIAGNOSIS — G5762 Lesion of plantar nerve, left lower limb: Secondary | ICD-10-CM | POA: Diagnosis not present

## 2014-09-07 DIAGNOSIS — C50912 Malignant neoplasm of unspecified site of left female breast: Secondary | ICD-10-CM

## 2014-09-07 NOTE — Telephone Encounter (Signed)
Spoke with patient. Has MRI scheduled for 2/02, Interdisciplinary Team visit 2/03.

## 2014-09-07 NOTE — Telephone Encounter (Signed)
Pt of Amy [name], has recently been diagnosed with breast cancer, and would like to speak with Amy [name] regarding this.

## 2014-09-09 ENCOUNTER — Telehealth: Payer: Self-pay | Admitting: *Deleted

## 2014-09-09 DIAGNOSIS — C50412 Malignant neoplasm of upper-outer quadrant of left female breast: Secondary | ICD-10-CM | POA: Insufficient documentation

## 2014-09-09 NOTE — Telephone Encounter (Signed)
Left message for a return phone call to schedule for [facility] 09/14/14.  Awaiting patient response.

## 2014-09-09 NOTE — Telephone Encounter (Signed)
Received call back from patient.  Confirmed [facility] for 09/14/14 at 8am .  Instructions and contact information given.

## 2014-09-12 ENCOUNTER — Ambulatory Visit
Admission: RE | Admit: 2014-09-12 | Discharge: 2014-09-12 | Disposition: A | Discharge status: Home / Self Care | Payer: BLUE CROSS/BLUE SHIELD | Source: Ambulatory Visit | Attending: Internal Medicine | Admitting: Internal Medicine

## 2014-09-12 DIAGNOSIS — C50912 Malignant neoplasm of unspecified site of left female breast: Secondary | ICD-10-CM

## 2014-09-12 DIAGNOSIS — C50412 Malignant neoplasm of upper-outer quadrant of left female breast: Secondary | ICD-10-CM | POA: Diagnosis not present

## 2014-09-12 DIAGNOSIS — Z9221 Personal history of antineoplastic chemotherapy: Secondary | ICD-10-CM

## 2014-09-12 HISTORY — DX: Personal history of antineoplastic chemotherapy: Z92.21

## 2014-09-12 MED ORDER — GADOBENATE DIMEGLUMINE 529 MG/ML IV SOLN
14.0000 mL | Freq: Once | INTRAVENOUS | Status: AC | PRN
  Administered 2014-09-12: 14 mL via INTRAVENOUS

## 2014-09-13 ENCOUNTER — Other Ambulatory Visit: Payer: BLUE CROSS/BLUE SHIELD

## 2014-09-14 ENCOUNTER — Ambulatory Visit: Payer: BLUE CROSS/BLUE SHIELD

## 2014-09-14 ENCOUNTER — Telehealth: Payer: Self-pay | Admitting: Hematology and Oncology

## 2014-09-14 ENCOUNTER — Encounter: Payer: Self-pay | Admitting: Physical Therapy

## 2014-09-14 ENCOUNTER — Ambulatory Visit (HOSPITAL_BASED_OUTPATIENT_CLINIC_OR_DEPARTMENT_OTHER): Payer: BLUE CROSS/BLUE SHIELD | Admitting: Hematology and Oncology

## 2014-09-14 ENCOUNTER — Ambulatory Visit
Admission: RE | Admit: 2014-09-14 | Discharge: 2014-09-14 | Disposition: A | Discharge status: Home / Self Care | Payer: BLUE CROSS/BLUE SHIELD | Source: Ambulatory Visit | Attending: Radiation Oncology | Admitting: Radiation Oncology

## 2014-09-14 ENCOUNTER — Encounter: Payer: Self-pay | Admitting: Skilled Nursing Facility1

## 2014-09-14 ENCOUNTER — Other Ambulatory Visit (INDEPENDENT_AMBULATORY_CARE_PROVIDER_SITE_OTHER): Payer: Self-pay | Admitting: General Surgery

## 2014-09-14 ENCOUNTER — Encounter: Payer: Self-pay | Admitting: Hematology and Oncology

## 2014-09-14 ENCOUNTER — Other Ambulatory Visit (HOSPITAL_BASED_OUTPATIENT_CLINIC_OR_DEPARTMENT_OTHER): Payer: BLUE CROSS/BLUE SHIELD

## 2014-09-14 ENCOUNTER — Encounter: Payer: Self-pay | Admitting: *Deleted

## 2014-09-14 ENCOUNTER — Ambulatory Visit: Payer: BLUE CROSS/BLUE SHIELD | Attending: General Surgery | Admitting: Physical Therapy

## 2014-09-14 VITALS — BP 150/89 | HR 82 | Temp 98.6°F | Resp 18 | Ht 65.5 in | Wt 159.7 lb

## 2014-09-14 DIAGNOSIS — C50412 Malignant neoplasm of upper-outer quadrant of left female breast: Secondary | ICD-10-CM

## 2014-09-14 DIAGNOSIS — Z87891 Personal history of nicotine dependence: Secondary | ICD-10-CM | POA: Diagnosis not present

## 2014-09-14 DIAGNOSIS — C50912 Malignant neoplasm of unspecified site of left female breast: Secondary | ICD-10-CM

## 2014-09-14 DIAGNOSIS — R293 Abnormal posture: Secondary | ICD-10-CM

## 2014-09-14 DIAGNOSIS — Z170 Estrogen receptor positive status [ER+]: Secondary | ICD-10-CM | POA: Insufficient documentation

## 2014-09-14 DIAGNOSIS — C50919 Malignant neoplasm of unspecified site of unspecified female breast: Secondary | ICD-10-CM | POA: Diagnosis present

## 2014-09-14 DIAGNOSIS — C50112 Malignant neoplasm of central portion of left female breast: Secondary | ICD-10-CM

## 2014-09-14 DIAGNOSIS — C50812 Malignant neoplasm of overlapping sites of left female breast: Secondary | ICD-10-CM

## 2014-09-14 DIAGNOSIS — K219 Gastro-esophageal reflux disease without esophagitis: Secondary | ICD-10-CM | POA: Diagnosis not present

## 2014-09-14 DIAGNOSIS — Z90710 Acquired absence of both cervix and uterus: Secondary | ICD-10-CM | POA: Diagnosis not present

## 2014-09-14 DIAGNOSIS — I10 Essential (primary) hypertension: Secondary | ICD-10-CM | POA: Diagnosis not present

## 2014-09-14 DIAGNOSIS — Z9889 Other specified postprocedural states: Secondary | ICD-10-CM | POA: Diagnosis not present

## 2014-09-14 LAB — COMPREHENSIVE METABOLIC PANEL (CC13)
ALBUMIN: 3.5 g/dL (ref 3.5–5.0)
ALT: 10 U/L (ref 0–55)
ANION GAP: 11 meq/L (ref 3–11)
AST: 13 U/L (ref 5–34)
Alkaline Phosphatase: 36 U/L — ABNORMAL LOW (ref 40–150)
BUN: 7.2 mg/dL (ref 7.0–26.0)
CALCIUM: 8.4 mg/dL (ref 8.4–10.4)
CHLORIDE: 103 meq/L (ref 98–109)
CO2: 24 meq/L (ref 22–29)
CREATININE: 0.7 mg/dL (ref 0.6–1.1)
EGFR: >90 mL/min/{1.73_m2} (ref >90)
Glucose: 92 mg/dl (ref 70–140)
Potassium: 3.9 mEq/L (ref 3.5–5.1)
SODIUM: 138 meq/L (ref 136–145)
Total Bilirubin: 0.26 mg/dL (ref 0.20–1.20)
Total Protein: 6.2 g/dL — ABNORMAL LOW (ref 6.4–8.3)

## 2014-09-14 LAB — CBC WITH DIFFERENTIAL/PLATELET
BASO%: 1.2 % (ref 0.0–2.0)
Basophils Absolute: 0.1 10*3/uL (ref 0.0–0.1)
EOS ABS: 0.3 10*3/uL (ref 0.0–0.5)
EOS%: 3.6 % (ref 0.0–7.0)
HEMATOCRIT: 37 % (ref 34.8–46.6)
HGB: 11.9 g/dL (ref 11.6–15.9)
LYMPH#: 3.3 10*3/uL (ref 0.9–3.3)
LYMPH%: 43.1 % (ref 14.0–49.7)
MCH: 27.8 pg (ref 25.1–34.0)
MCHC: 32.2 g/dL (ref 31.5–36.0)
MCV: 86.3 fL (ref 79.5–101.0)
MONO#: 0.8 10*3/uL (ref 0.1–0.9)
MONO%: 10.8 % (ref 0.0–14.0)
NEUT%: 41.3 % (ref 38.4–76.8)
NEUTROS ABS: 3.2 10*3/uL (ref 1.5–6.5)
PLATELETS: 559 10*3/uL — AB (ref 145–400)
RBC: 4.29 10*6/uL (ref 3.70–5.45)
RDW: 13 % (ref 11.2–14.5)
WBC: 7.7 10*3/uL (ref 3.9–10.3)

## 2014-09-14 NOTE — Progress Notes (Unsigned)
Subjective:     Patient ID: Amy [name], female   DOB: November 28, 1950, 64 y.o.   MRN: [id number]  HPI   Review of Systems     Objective:   Physical Exam    For the patient to understand and implement a plant based diet.  For the patient to be physically active everyday. Assessment:     A large left breast mass that will be treated with mastectomy. Current Medication-Lisinopril. Her weight is within her normal range at 159 pounds and a BMI of 26.2. Her cholesterol is increased at 203 and her A1C is increased at 5.5. She has had prior diagnoses of: hypertension, GERD, and Diverticulosis. The dietitian educated the patient on a plant based diet, physical activity (she will take the dog for a walk), and to be cognizant of her salt intake.     Plan:     For the parameters of the plant based diet and physical activity to be discussed. For the patient to receive the cancer dietitians contact information.

## 2014-09-14 NOTE — Telephone Encounter (Signed)
, °

## 2014-09-14 NOTE — Progress Notes (Signed)
Checked in new pt with no financial concerns prior to seeing the dr.  Pt has 2 insurances so financial assistance may not be needed but she has my card for any billing questions or concerns. °

## 2014-09-14 NOTE — Progress Notes (Signed)
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 female who is seen today at the breast multidisciplinary clinic through the courtesy Dr. Dalbert Batman for evaluation of her stage T3 N0  Invasive ductal carcinoma of the left breast.  At the time of a [facility] mammogram on January 12 she was felt to have a possible left breast mass.  Additional views and ultrasound on 08/30/2014 showed a 5.7 x 4.6 x 5.6 cm mass at 3:00.  On ultrasound this was seen to be adjacent to the areola at 3:00.  Ultrasound of the axilla was negative.  A biopsy on 09/06/2014 was diagnostic for invasive ductal carcinoma with abundant mucin.  The tumor was ER positive at 97%, PR positive at 98% with an elevated Ki-67 of 47%.  HER-2/neu negative.  Breast MR showed a 6.5 cm mass with 2 satellite lesions measuring 8 mm just 5 mm anterior and superior to the dominant mass.  There were no suspicious axillary findings.  She is without complaints today.  She has been on hormone replacement therapy for 42 years since having a total hysterectomy for benign disease at age 48.  Also of note is that she had surgery for a benign left cerebellar meningioma approximately 2 years ago.  She is seen today with Dr. Dalbert Batman and Dr. Lindi Adie.    PREVIOUS RADIATION THERAPY: No   PAST MEDICAL HISTORY:  has a past medical history of Allergy; PONV (postoperative nausea and vomiting); Hypertension; GERD (gastroesophageal reflux disease); Headache(784.0); Cerebral meningioma (09/2012); and Anxiety.     PAST SURGICAL HISTORY:  Past Surgical History  Procedure Laterality Date  . Fracture surgery     . Abdominal hysterectomy    . Patellar tendon repair  05/27/2012    Procedure: PATELLA TENDON REPAIR;  Surgeon: Newt Minion, MD;  Location: [facility];  Service: Orthopedics;  Laterality: Right;  Right Patella Tendon  Reconstruction with Semi Tendonosum  . Joint replacement Right [id number]    TKR; Dr. Sharol Given  . Knee arthroscopy Left   . Spine surgery  1989    L2 reconstruction; fusion  . Brain surgery       FAMILY HISTORY: family history includes Hypertension in her father.  Her father died of a rare brain tumor at 78.  Her mother died in a motor vehicle accident at 44.  No family history of breast cancer.   SOCIAL HISTORY:  reports that she quit smoking about 19 years ago. She has never used smokeless tobacco. She reports that she does not drink alcohol or use illicit drugs.  Married, no children.  She works as a Sales executive.   ALLERGIES: Codeine; Darvocet; Gold-containing drug products; Tramadol; and Sulfa antibiotics   MEDICATIONS:  Current Outpatient Prescriptions  Medication Sig Dispense Refill  . ALPRAZolam (XANAX) 0.25 MG tablet Take 1 tablet (0.25 mg total) by mouth 2 (two) times daily as needed. 60 tablet 0  . citalopram (CELEXA) 40 MG tablet TAKE 1 TABLET (40 MG TOTAL) BY MOUTH DAILY. 90 tablet 3  . estrogens, conjugated, (PREMARIN) 1.25  MG tablet TAKE 1 TABLET (1.25 MG TOTAL) BY MOUTH DAILY 90 tablet 3  . HYDROcodone-acetaminophen (NORCO) 5-325 MG per tablet Take 1 tablet by mouth every 4 (four) hours as needed. For pain    . lisinopril (PRINIVIL,ZESTRIL) 5 MG tablet Take 1 tablet (5 mg total) by mouth daily. 90 tablet 3  . metoprolol succinate (TOPROL-XL) 100 MG 24 hr tablet Take 1 tablet (100 mg total) by mouth daily. Take with or immediately following a meal. 90 tablet 3  . verapamil (VERELAN PM) 180 MG 24 hr capsule TAKE 1 CAPSULE (180 MG TOTAL) BY MOUTH 2 (TWO) TIMES DAILY. 180 capsule 3  . zolpidem (AMBIEN) 10 MG tablet TAKE 1 TABLET  AT BEDTIME 30 tablet 2   No current facility-administered medications for this encounter.     REVIEW OF SYSTEMS:  Pertinent items are noted in HPI.    PHYSICAL EXAM: Alert and oriented 64 year old white female appearing her stated age. Wt Readings from Last 3 Encounters:  09/14/14 159 lb 11.2 oz (72.439 kg)  08/16/14 156 lb (70.761 kg)  01/06/14 152 lb (68.947 kg)   Temp Readings from Last 3 Encounters:  09/14/14 98.6 F (37 C) Oral  08/16/14 97.8 F (36.6 C) Oral  01/06/14 97.9 F (36.6 C) Oral   BP Readings from Last 3 Encounters:  09/14/14 150/89  08/16/14 152/93  01/06/14 138/88   Pulse Readings from Last 3 Encounters:  09/14/14 82  08/16/14 78  01/06/14 88   Head and neck examination: Grossly unremarkable with the exception of slight left facial weakness and diminished hearing on the left.  Nodes: Without palpable cervical, supraclavicular, or axillary lymphadenopathy.  Lungs clear.  Breasts: There is an obvious mass seen along the lateral aspect of her left breast at 3:00 measuring approximately 6 cm on palpation.  It is freely mobile.  Right breast without masses or lesions.  Extremities: Without edema.    LABORATORY DATA:  Lab Results  Component Value Date   WBC 7.7 09/14/2014   HGB 11.9 09/14/2014   HCT 37.0 09/14/2014   MCV 86.3 09/14/2014   PLT 559* 09/14/2014   Lab Results  Component Value Date   NA 138 09/14/2014   K 3.9 09/14/2014   CL 103 08/16/2014   CO2 24 09/14/2014   Lab Results  Component Value Date   ALT 10 09/14/2014   AST 13 09/14/2014   ALKPHOS 36* 09/14/2014   BILITOT 0.26 09/14/2014      IMPRESSION: Clinical stage IIB (T3a N0 MX) invasive ductal carcinoma of the left breast.  We discussed mastectomy versus breast conservation.  It is probably unlikely that she will have significant reduction of her tumor for breast preservation, and the patient prefers mastectomy.  Dr. Dalbert Batman will move ahead with a left simple mastectomy and   sentinel lymph node biopsy.  She may need post mastectomy radiation therapy depending on her pathologic stage.  We discussed the potential acute and late toxicities of post mastectomy radiation therapy.  Also discussed the fact that this could affect her reconstruction options.  We discussed the technical ability to avoid cardiac irradiation with deep inspiration breath-hold technology.  PLAN: As discussed above.  I can see her back for a follow-up visit following her definitive surgery.   I spent 30 minutes face to face with the patient and more than 50% of that time was spent in counseling and/or coordination of care.

## 2014-09-14 NOTE — Therapy (Signed)
[place], [place], [address] Phone: [phone]   Fax:  [phone]  Physical Therapy Evaluation  Patient Details  Name: Amy [name] MRN: [id number] Date of Birth: [birth year] Referring Provider:  Fanny Skates, MD  Encounter Date: 09/14/2014      PT End of Session - 09/14/14 1307    Visit Number 1   Number of Visits 1   PT Start Time 2446   PT Stop Time 1030   PT Time Calculation (min) 28 min   Activity Tolerance Patient tolerated treatment well   Behavior During Therapy [facility] for tasks assessed/performed      Past Medical History  Diagnosis Date  . Allergy   . PONV (postoperative nausea and vomiting)   . Hypertension   . GERD (gastroesophageal reflux disease)   . Headache(784.0)     hx migraine  . Cerebral meningioma 09/2012    auditory; s/p excision  . Anxiety     Past Surgical History  Procedure Laterality Date  . Fracture surgery    . Abdominal hysterectomy    . Patellar tendon repair  05/27/2012    Procedure: PATELLA TENDON REPAIR;  Surgeon: Newt Minion, MD;  Location: [facility];  Service: Orthopedics;  Laterality: Right;  Right Patella Tendon  Reconstruction with Semi Tendonosum  . Joint replacement Right [id number]    TKR; Dr. Sharol Given  . Knee arthroscopy Left   . Spine surgery  1989    L2 reconstruction; fusion  . Brain surgery      There were no vitals taken for this visit.  Visit Diagnosis:  Left breast cancer with T3 tumor, >5 cm in greatest dimension - Plan: PT plan of care cert/re-cert  Abnormal posture - Plan: PT plan of care cert/re-cert      Subjective Assessment - 09/14/14 1245    Symptoms Patient is here today for an assessment of her newly diagnosed left breast cancer.   Pertinent History Patient diagnosed with left ER/PR positive, HER2 negative breast cancer which is 6.5 cm in size.     Patient Stated Goals Reduce lymphedema risk and learn post op shoulder ROM  exercises   Currently in Pain? Yes   Pain Score 8    Pain Location Foot   Pain Orientation Right;Left   Pain Descriptors / Indicators Constant   Pain Type Chronic pain   Pain Onset More than a month ago   Pain Frequency Constant   Aggravating Factors  walking   Pain Relieving Factors injections          [facility] PT Assessment - 09/14/14 0001    Assessment   Medical Diagnosis Left breast cancer   Onset Date 09/07/14   Precautions   Precautions Other (comment);Fall  Active cancer   Balance Screen   Has the patient fallen in the past 6 months No   Has the patient had a decrease in activity level because of a fear of falling?  No   Is the patient reluctant to leave their home because of a fear of falling?  No  Pt reports poor balance due to benign brain tumor resection   Home Environment   Living Enviornment Private residence   Living Arrangements Spouse/significant other   Available Help at Discharge Family   Prior Function   Level of Independence Independent with basic ADLs   Vocation Retired   Leisure She does not exercise   Cognition   Overall Cognitive Status Within Functional Limits for tasks assessed  Posture/Postural Control   Posture/Postural Control Postural limitations   Postural Limitations Rounded Shoulders;Forward head   AROM   Right Shoulder Extension 58 Degrees   Right Shoulder Flexion 160 Degrees   Right Shoulder ABduction 160 Degrees   Right Shoulder Internal Rotation 68 Degrees   Right Shoulder External Rotation 82 Degrees   Left Shoulder Extension 64 Degrees   Left Shoulder Flexion 165 Degrees   Left Shoulder ABduction 175 Degrees   Left Shoulder Internal Rotation 65 Degrees   Left Shoulder External Rotation 86 Degrees   Strength   Overall Strength Within functional limits for tasks performed           LYMPHEDEMA/ONCOLOGY QUESTIONNAIRE - 09/14/14 1305    Type   Cancer Type Left breast   Lymphedema Assessments   Lymphedema Assessments Upper  extremities   Right Upper Extremity Lymphedema   10 cm Proximal to Olecranon Process 28.3 cm   Olecranon Process 24 cm   10 cm Proximal to Ulnar Styloid Process 21.6 cm   Just Proximal to Ulnar Styloid Process 14.8 cm   Across Hand at [employer] 18.3 cm   At [place] of 2nd Digit 6 cm   Left Upper Extremity Lymphedema   10 cm Proximal to Olecranon Process 27.9 cm   Olecranon Process 23.8 cm   10 cm Proximal to Ulnar Styloid Process 20.8 cm   Just Proximal to Ulnar Styloid Process 15 cm   Across Hand at [employer] 18.1 cm   At [place] of 2nd Digit 6 cm           PT Education - 09/14/14 1307    Education provided Yes   Education Details Post op shoulder ROM HEP; lymphedema risk reduction practices   Person(s) Educated Patient;Spouse   Methods Explanation;Demonstration;Handout   Comprehension Verbalized understanding              Breast Clinic Goals - 09/14/14 1315    Patient will be able to verbalize understanding of pertinent lymphedema risk reduction practices relevant to her diagnosis specifically related to skin care.   Time 1   Period Days   Status Achieved   Patient will be able to return demonstrate and/or verbalize understanding of the post-op home exercise program related to regaining shoulder range of motion.   Time 1   Period Days   Status Achieved   Patient will be able to verbalize understanding of the importance of attending the postoperative After Breast Cancer Class for further lymphedema risk reduction education and therapeutic exercise.   Time 1   Period Days   Status Achieved              Plan - 09/14/14 1307    Clinical Impression Statement Patient is a 64 year old woman recently diagnosed with left breast cancer.  She has a history of a benign brain tumor which limits her balance but reports no falls and reports she is very active and otherwise healthy.  She has chronic bilateral feet pain which is managed with injections.  She is  planning to have a left mastectomy and sentinel node biopsy due to the large size of her tumor.  She will then undergo chemotherapy and possibly radiation followed by anti-estrogen therapy.  She may benefit from P.T. post operatively to regain shoulder ROM and reduce lymphedema risk.   Pt will benefit from skilled therapeutic intervention in order to improve on the following deficits Decreased range of motion;Increased edema;Decreased knowledge of precautions;Decreased strength;Impaired UE  functional use;Pain   Rehab Potential Good   Clinical Impairments Affecting Rehab Potential none   PT Frequency One time visit   PT Treatment/Interventions Patient/family education;Therapeutic exercise   Consulted and Agree with Plan of Care Patient;Family member/caregiver   Family Member Consulted husband    Patient will follow up at outpatient cancer rehab if needed following surgery.  If the patient requires physical therapy at that time, a specific plan will be dictated and sent to the referring physician for approval. The patient was educated today on appropriate basic range of motion exercises to begin post operatively and the importance of attending the After Breast Cancer class following surgery.  Patient was educated today on lymphedema risk reduction practices as it pertains to recommendations that will benefit the patient immediately following surgery.  She verbalized good understanding.  No additional physical therapy is indicated at this time.         G-Codes - 25-Sep-2014 1315    Functional Assessment Tool Used Clinical judgement   Functional Limitation Other PT primary   Other PT Primary Current Status (J4232) At least 1 percent but less than 20 percent impaired, limited or restricted   Other PT Primary Goal Status (W0941) At least 1 percent but less than 20 percent impaired, limited or restricted   Other PT Primary Discharge Status (L9199) At least 1 percent but less than 20 percent impaired,  limited or restricted       Problem List Patient Active Problem List   Diagnosis Date Noted  . Breast cancer of upper-outer quadrant of left female breast 09/09/2014  . HTN (hypertension) 12/24/2012  . Migraine 12/24/2012  . GERD (gastroesophageal reflux disease) 12/24/2012  . Hearing loss in left ear 12/24/2012  . 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 Insomnia 12/24/2012  . Diverticulosis 12/24/2012  . Personal history of colonic polyps 12/24/2012  . Hyperlipidemia 12/24/2012  . BCC (basal cell carcinoma), arm 12/24/2012    Sergi Gellner,MARTI COOPER, PT 09-25-14, 1:19 PM  [place], [place], [address] Phone: [phone]   Fax:  [phone]

## 2014-09-14 NOTE — Progress Notes (Signed)
Clinical Social Work [place] Psychosocial Distress Screening [place]  Patient completed distress screening protocol and scored a 6 on the Psychosocial Distress Thermometer which indicates moderate distress. Clinical Social Worker met with patient and patients husband in [facility] to assess for distress and other psychosocial needs. Patient stated she was doing well and felt comfortable with the treatment plan discussed with the multidisciplinary team. CSW and patient discussed the importance of support and common emotional responses to being diagnosed with cancer. CSW informed patient and patient's husband of the support team and support services at [facility], and patient was agreeable to an [place] referral. Patient did not express any concerns and this time. CSW encouraged patient to call as questions or concerns arise.    ONCBCN DISTRESS SCREENING 09/14/2014  Screening Type Initial Screening  Distress experienced in past week (1-10) 6  Emotional problem type Nervousness/Anxiety  Information Concerns Type Lack of info about diagnosis  Physician notified of physical symptoms Yes  Referral to clinical psychology No  Referral to clinical social work Yes  Referral to dietition No  Referral to financial advocate No  Referral to support programs Yes  Referral to palliative care No   Johnnye [name], MSW, LCSW, OSW-C Clinical Social Worker [place] [phone]

## 2014-09-14 NOTE — Assessment & Plan Note (Addendum)
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 T3 N0 M0 clinical stage IIB  Pathology and radiology counseling:Discussed with the patient, the details of pathology including the type of breast cancer,the clinical staging, the significance of ER, PR and HER-2/neu receptors and the implications for treatment. After reviewing the pathology in detail, we proceeded to discuss the different treatment options between surgery, radiation, chemotherapy, antiestrogen therapies.  Recommendation: 1. Staging evaluation with CT chest abdomen pelvis and bone scan 2. Mastectomy followed by 3. Adjuvant chemotherapy (Adriamycin and Cytoxan dose dense 4 followed by Taxol 12 weekly) 4. Followed by radiation therapy to be followed by  5. antiestrogen therapy  Plan: 1. Patient will need port placement during her lumpectomy surgery 2. Echocardiogram for evaluation of heart function 3. Return to clinic after surgery for initiation of chemotherapy

## 2014-09-14 NOTE — Progress Notes (Signed)
[facility] NOTE  Patient Care Team: Leandrew Koyanagi, MD as PCP - General (Family Medicine) Fanny Skates, MD as Consulting Physician (General Surgery) Rulon Eisenmenger, MD as Consulting Physician (Hematology and Oncology) Rexene Edison, MD as Consulting Physician (Radiation Oncology) [facility], RN as Registered Nurse Roselee Culver, RN as Registered Nurse  CHIEF COMPLAINTS/PURPOSE OF CONSULTATION:  Newly diagnosed breast cancer  HISTORY OF PRESENTING ILLNESS:  Amy [name] 64 y.o. female is here because of recent diagnosis of left breast cancer. Patient had a screening mammogram detected a left breast mass measuring 5.7 cm. Prior to that she has always felt an abnormality in the breast however she thought it was just because she has dense breasts. This was biopsy-proven to be grade 2 invasive ductal carcinoma ER/PR positive HER-2 negative with a Ki-67 of 47%. She underwent an MRI of the breast attributed to 6.5 similar mass with 2 separate satellite nodules measuring 8 mm each. We presented her case in the multidisciplinary tumor board and she is here today for [facility] clinic to discuss a treatment plan. She denies any pain or discomfort. She has a bruise recent biopsy.  I reviewed her records extensively and collaborated the history with the patient.  SUMMARY OF ONCOLOGIC HISTORY:   Breast cancer of upper-outer quadrant of left female breast   09/06/2014 Initial Diagnosis Left breast 3:00 biopsy: Invasive ductal carcinoma with abundant extracellular mucin, ER 97%, PR 98%, Ki-67 47%, HER-2 negative ratio 1.28   09/12/2014 Breast MRI Left breast: 6.5 x 5.3 x 5.1 cm irregular enhancing mass with 2 adjacent 8 mm satellite masses, no lymphadenopathy    In terms of breast cancer risk profile:  She menarched at early age of 62 and has not had periods since age 69 when she had hysterectomy  She had no pregnancies She has not received birth control  pills.  She was never exposed to fertility medications or hormone replacement therapy.  She has no family history of Breast/GYN/GI cancer  MEDICAL HISTORY:  Past Medical History  Diagnosis Date  . Allergy   . PONV (postoperative nausea and vomiting)   . Hypertension   . GERD (gastroesophageal reflux disease)   . Headache(784.0)     hx migraine  . Cerebral meningioma 09/2012    auditory; s/p excision  . Anxiety     SURGICAL HISTORY: Past Surgical History  Procedure Laterality Date  . Fracture surgery    . Abdominal hysterectomy    . Patellar tendon repair  05/27/2012    Procedure: PATELLA TENDON REPAIR;  Surgeon: Newt Minion, MD;  Location: [facility];  Service: Orthopedics;  Laterality: Right;  Right Patella Tendon  Reconstruction with Semi Tendonosum  . Joint replacement Right [id number]    TKR; Dr. Sharol Given  . Knee arthroscopy Left   . Spine surgery  1989    L2 reconstruction; fusion  . Brain surgery      SOCIAL HISTORY: History   Social History  . Marital Status: Married    Spouse Name: Herbie Baltimore    Number of Children: 0  . Years of Education: N/A   Occupational History  . disability     HV[employer]   Social History Main Topics  . Smoking status: Former Smoker -- 0.25 packs/day for 20 years    Quit date: 05/22/1995  . Smokeless tobacco: Never Used  . Alcohol Use: No  . Drug Use: No  . Sexual Activity:    Partners: Male  Birth Control/ Protection: None   Other Topics Concern  . Not on file   Social History Narrative   Lives with her husband, 5 cats, 2 dogs and a variety of other pets. Raised a child, Eddie Dibbles, who is now grown.    FAMILY HISTORY: Family History  Problem Relation Age of Onset  . Hypertension Father     ALLERGIES:  is allergic to codeine; darvocet; gold-containing drug products; tramadol; and sulfa antibiotics.  MEDICATIONS:  Current Outpatient Prescriptions  Medication Sig Dispense Refill  . ALPRAZolam (XANAX) 0.25 MG tablet Take 1 tablet (0.25 mg  total) by mouth 2 (two) times daily as needed. 60 tablet 0  . citalopram (CELEXA) 40 MG tablet TAKE 1 TABLET (40 MG TOTAL) BY MOUTH DAILY. 90 tablet 3  . estrogens, conjugated, (PREMARIN) 1.25 MG tablet TAKE 1 TABLET (1.25 MG TOTAL) BY MOUTH DAILY 90 tablet 3  . HYDROcodone-acetaminophen (NORCO) 5-325 MG per tablet Take 1 tablet by mouth every 4 (four) hours as needed. For pain    . lisinopril (PRINIVIL,ZESTRIL) 5 MG tablet Take 1 tablet (5 mg total) by mouth daily. 90 tablet 3  . metoprolol succinate (TOPROL-XL) 100 MG 24 hr tablet Take 1 tablet (100 mg total) by mouth daily. Take with or immediately following a meal. 90 tablet 3  . verapamil (VERELAN PM) 180 MG 24 hr capsule TAKE 1 CAPSULE (180 MG TOTAL) BY MOUTH 2 (TWO) TIMES DAILY. 180 capsule 3  . zolpidem (AMBIEN) 10 MG tablet TAKE 1 TABLET AT BEDTIME 30 tablet 2   No current facility-administered medications for this visit.    REVIEW OF SYSTEMS:   Constitutional: Denies fevers, chills or abnormal night sweats Eyes: Denies blurriness of vision, double vision or watery eyes Ears, nose, mouth, throat, and face: Denies mucositis or sore throat Respiratory: Denies cough, dyspnea or wheezes Cardiovascular: Denies palpitation, chest discomfort or lower extremity swelling Gastrointestinal:  Denies nausea, heartburn or change in bowel habits Skin: Denies abnormal skin rashes Lymphatics: Denies new lymphadenopathy or easy bruising Neurological:Denies numbness, tingling or new weaknesses Behavioral/Psych: Mood is stable, no new changes  Breast: Large palpable lump in the left breast All other systems were reviewed with the patient and are negative.  PHYSICAL EXAMINATION: ECOG PERFORMANCE STATUS: 0 - Asymptomatic  Filed Vitals:   09/14/14 0931  BP: 150/89  Pulse: 82  Temp: 98.6 F (37 C)  Resp: 18   Filed Weights   09/14/14 0931  Weight: 159 lb 11.2 oz (72.439 kg)    GENERAL:alert, no distress and comfortable SKIN: skin color,  texture, turgor are normal, no rashes or significant lesions EYES: normal, conjunctiva are pink and non-injected, sclera clear OROPHARYNX:no exudate, no erythema and lips, buccal mucosa, and tongue normal  NECK: supple, thyroid normal size, non-tender, without nodularity LYMPH:  no palpable lymphadenopathy in the cervical, axillary or inguinal LUNGS: clear to auscultation and percussion with normal breathing effort HEART: regular rate & rhythm and no murmurs and no lower extremity edema ABDOMEN:abdomen soft, non-tender and normal bowel sounds Musculoskeletal:no cyanosis of digits and no clubbing  PSYCH: alert & oriented x 3 with fluent speech NEURO: no focal motor/sensory deficits BREAST: Large mass palpable in the left breast. No palpable axillary or supraclavicular lymphadenopathy (exam performed in the presence of a chaperone)   LABORATORY DATA:  I have reviewed the data as listed Lab Results  Component Value Date   WBC 7.7 09/14/2014   HGB 11.9 09/14/2014   HCT 37.0 09/14/2014   MCV 86.3 09/14/2014  PLT 559* 09/14/2014   Lab Results  Component Value Date   NA 138 09/14/2014   K 3.9 09/14/2014   CL 103 08/16/2014   CO2 24 09/14/2014    RADIOGRAPHIC STUDIES: I have personally reviewed the radiological reports and agreed with the findings in the report.  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 T3 N0 M0 clinical stage IIB  Pathology and radiology counseling:Discussed with the patient, the details of pathology including the type of breast cancer,the clinical staging, the significance of ER, PR and HER-2/neu receptors and the implications for treatment. After reviewing the pathology in detail, we proceeded to discuss the different treatment options between surgery, radiation, chemotherapy,  antiestrogen therapies.  Recommendation: 1. Staging evaluation with CT chest abdomen pelvis and bone scan 2. Mastectomy followed by 3. Adjuvant chemotherapy (Adriamycin and Cytoxan dose dense 4 followed by Taxol 12 weekly) 4. Followed by radiation therapy to be followed by  5. antiestrogen therapy  Plan: 1. Patient will need port placement during her lumpectomy surgery 2. Echocardiogram for evaluation of heart function 3. Return to clinic after surgery for initiation of chemotherapy   All questions were answered. The patient knows to call the clinic with any problems, questions or concerns.    Rulon Eisenmenger, MD 11:30 AM

## 2014-09-14 NOTE — Progress Notes (Signed)
Note created by Dr. Gudena during office visit. Copy to patient, original to scan. 

## 2014-09-14 NOTE — Patient Instructions (Signed)
ABC CLASS After Breast Cancer Class  After Breast Cancer Class is a specially designed exercise class to assist you in a safe recover after having breast cancer surgery.  In this class you will learn how to get back to full function whether your drains were just removed or if you had surgery a month ago.  This one-time class is held the 1st and 3rd Monday of every month from 11:00 a.m. until 12:00 noon at the [facility] located at [place], [place] [address]  This class is FREE and space is limited. For more information or to register for the next available class, call [phone].  Class Goals   Understand specific stretches to improve the flexibility of you chest and shoulder.  Learn ways to safely strengthen your upper body and improve your posture.  Understand the warning signs of infection and why you may be at risk for an arm infection.  Learn about Lymphedema and prevention.  ** You do not attend this class until after surgery.  Drains mus be removed to participateABC CLASS After Breast Cancer Class  After Breast Cancer Class is a specially designed exercise class to assist you in a safe recover after having breast cancer surgery.  In this class you will learn how to get back to full function whether your drains were just removed or if you had surgery a month ago.  This one-time class is held the 1st and 3rd Monday of every month from 11:00 a.m. until 12:00 noon at the [facility] located at [place], [place] [address]  This class is FREE and space is limited. For more information or to register for the next available class, call [phone].  Class Goals   Understand specific stretches to improve the flexibility of you chest and shoulder.  Learn ways to safely strengthen your upper body and improve your posture.  Understand the warning signs of infection and why you may be at  risk for an arm infection.  Learn about Lymphedema and prevention.  ** You do not attend this class until after surgery.  Drains must be removed to participate  Patient was instructed today in a home exercise program today for post op shoulder range of motion. These included active assist shoulder flexion in sitting, scapular retraction, wall walking with shoulder abduction, and hands behind head external rotation.  She was encouraged to do these twice a day, holding 3 seconds and repeating 5 times when permitted by her physician.

## 2014-09-16 ENCOUNTER — Telehealth: Payer: Self-pay | Admitting: *Deleted

## 2014-09-16 NOTE — Telephone Encounter (Signed)
Spoke to pt concerning [place] from 09/16/14. Pt denies questions regarding dx or treatment care plan. Confirmed future appts and surgery date. Encourage pt to call with needs. Received verbal understanding. Contact information given.

## 2014-09-20 ENCOUNTER — Ambulatory Visit (HOSPITAL_COMMUNITY)
Admission: RE | Admit: 2014-09-20 | Discharge: 2014-09-20 | Disposition: A | Discharge status: Home / Self Care | Payer: BLUE CROSS/BLUE SHIELD | Source: Ambulatory Visit | Attending: General Surgery | Admitting: General Surgery

## 2014-09-20 ENCOUNTER — Encounter (HOSPITAL_COMMUNITY): Payer: Self-pay

## 2014-09-20 ENCOUNTER — Other Ambulatory Visit: Payer: Self-pay

## 2014-09-20 ENCOUNTER — Telehealth: Payer: Self-pay

## 2014-09-20 ENCOUNTER — Encounter (HOSPITAL_COMMUNITY)
Admission: RE | Admit: 2014-09-20 | Discharge: 2014-09-20 | Disposition: A | Discharge status: Home / Self Care | Payer: BLUE CROSS/BLUE SHIELD | Source: Ambulatory Visit | Attending: General Surgery | Admitting: General Surgery

## 2014-09-20 ENCOUNTER — Other Ambulatory Visit: Payer: Self-pay | Admitting: Physician Assistant

## 2014-09-20 DIAGNOSIS — K449 Diaphragmatic hernia without obstruction or gangrene: Secondary | ICD-10-CM | POA: Diagnosis not present

## 2014-09-20 DIAGNOSIS — Z01818 Encounter for other preprocedural examination: Secondary | ICD-10-CM | POA: Diagnosis not present

## 2014-09-20 DIAGNOSIS — C50412 Malignant neoplasm of upper-outer quadrant of left female breast: Secondary | ICD-10-CM | POA: Insufficient documentation

## 2014-09-20 HISTORY — DX: Personal history of other diseases of the digestive system: Z87.19

## 2014-09-20 LAB — CBC WITH DIFFERENTIAL/PLATELET
BASOS ABS: 0.1 10*3/uL (ref 0.0–0.1)
Basophils Relative: 1 % (ref 0–1)
Eosinophils Absolute: 0.2 10*3/uL (ref 0.0–0.7)
Eosinophils Relative: 3 % (ref 0–5)
HCT: 36.7 % (ref 36.0–46.0)
HEMOGLOBIN: 12.1 g/dL (ref 12.0–15.0)
Lymphocytes Relative: 42 % (ref 12–46)
Lymphs Abs: 2.9 10*3/uL (ref 0.7–4.0)
MCH: 28.7 pg (ref 26.0–34.0)
MCHC: 33 g/dL (ref 30.0–36.0)
MCV: 87 fL (ref 78.0–100.0)
MONOS PCT: 10 % (ref 3–12)
Monocytes Absolute: 0.7 10*3/uL (ref 0.1–1.0)
NEUTROS PCT: 44 % (ref 43–77)
Neutro Abs: 3.1 10*3/uL (ref 1.7–7.7)
Platelets: 505 10*3/uL — ABNORMAL HIGH (ref 150–400)
RBC: 4.22 MIL/uL (ref 3.87–5.11)
RDW: 13.1 % (ref 11.5–15.5)
WBC: 6.9 10*3/uL (ref 4.0–10.5)

## 2014-09-20 LAB — COMPREHENSIVE METABOLIC PANEL
ALT: 11 U/L (ref 0–35)
AST: 18 U/L (ref 0–37)
Albumin: 3.5 g/dL (ref 3.5–5.2)
Alkaline Phosphatase: 34 U/L — ABNORMAL LOW (ref 39–117)
Anion gap: 12 (ref 5–15)
BUN: 8 mg/dL (ref 6–23)
CO2: 21 mmol/L (ref 19–32)
Calcium: 9.1 mg/dL (ref 8.4–10.5)
Chloride: 104 mmol/L (ref 96–112)
Creatinine, Ser: 0.57 mg/dL (ref 0.50–1.10)
GFR calc non Af Amer: >90 mL/min (ref >90)
Glucose, Bld: 111 mg/dL — ABNORMAL HIGH (ref 70–99)
POTASSIUM: 3.7 mmol/L (ref 3.5–5.1)
SODIUM: 137 mmol/L (ref 135–145)
TOTAL PROTEIN: 6.1 g/dL (ref 6.0–8.3)
Total Bilirubin: 0.2 mg/dL — ABNORMAL LOW (ref 0.3–1.2)

## 2014-09-20 MED ORDER — CHLORHEXIDINE GLUCONATE 4 % EX LIQD: 1.0000 "application " | Freq: Once | CUTANEOUS | Status: DC

## 2014-09-20 NOTE — Telephone Encounter (Signed)
Pt wanted Chelle to know she had to have a vasectomy Friday and will be during her Chemo after March. Would like to have some more XANAX. Please call [phone]

## 2014-09-20 NOTE — Pre-Procedure Instructions (Signed)
Amy [name]  09/20/2014   Your procedure is scheduled on:  Friday September 23, 2014 at [place] PM  Report to [facility] Admitting at 1054 AM.  Call this number if you have problems the morning of surgery: [phone]   Remember:   Do not eat food or drink liquids after midnight Thursday 09/22/14.   Take these medicines the morning of surgery with A SIP OF WATER: Xanax, Celexa, Hydrocodone-acetaminophen if needed for pain, Metoprolol, and Verapamil.  Stop Aspirin, Nsaids and herbal meds 5 days prior to surgery.   Do not wear jewelry, make-up or nail polish.  Do not wear lotions, powders, or perfumes. You may wear deodorant.  Do not shave 48 hours prior to surgery.   Do not bring valuables to the hospital.  [facility] is not responsible                  for any belongings or valuables.               Contacts, dentures or bridgework may not be worn into surgery.  Leave suitcase in the car. After surgery it may be brought to your room.  For patients admitted to the hospital, discharge time is determined by your                treatment team.               Patients discharged the day of surgery will not be allowed to drive  home.    Special Instructions: [facility] - Preparing for Surgery  Before surgery, you can play an important role.  Because skin is not sterile, your skin needs to be as free of germs as possible.  You can reduce the number of germs on you skin by washing with CHG (chlorahexidine gluconate) soap before surgery.  CHG is an antiseptic cleaner which kills germs and bonds with the skin to continue killing germs even after washing.  Please DO NOT use if you have an allergy to CHG or antibacterial soaps.  If your skin becomes reddened/irritated stop using the CHG and inform your nurse when you arrive at Short Stay.  Do not shave (including legs and underarms) for at least 48 hours prior to the first CHG shower.  You may shave your face.  Please follow these  instructions carefully:   1.  Shower with CHG Soap the night before surgery and the                                morning of Surgery.  2.  If you choose to wash your hair, wash your hair first as usual with your       normal shampoo.  3.  After you shampoo, rinse your hair and body thoroughly to remove the                      Shampoo.  4.  Use CHG as you would any other liquid soap.  You can apply chg directly       to the skin and wash gently with scrungie or a clean washcloth.  5.  Apply the CHG Soap to your body ONLY FROM THE NECK DOWN.        Do not use on open wounds or open sores.  Avoid contact with your eyes,       ears, mouth and genitals (  private parts).  Wash genitals (private parts)       with your normal soap.  6.  Wash thoroughly, paying special attention to the area where your surgery        will be performed.  7.  Thoroughly rinse your body with warm water from the neck down.  8.  DO NOT shower/wash with your normal soap after using and rinsing off       the CHG Soap.  9.  Pat yourself dry with a clean towel.            10.  Wear clean pajamas.            11.  Place clean sheets on your bed the night of your first shower and do not        sleep with pets.  Day of Surgery  Do not apply any lotions/deoderants the morning of surgery.  Please wear clean clothes to the hospital/surgery center.      Please read over the following fact sheets that you were given: Pain Booklet, Coughing and Deep Breathing and Surgical Site Infection Prevention

## 2014-09-21 ENCOUNTER — Encounter: Payer: Self-pay | Admitting: *Deleted

## 2014-09-21 ENCOUNTER — Ambulatory Visit (HOSPITAL_COMMUNITY): Admission: RE | Admit: 2014-09-21 | Payer: BLUE CROSS/BLUE SHIELD | Source: Ambulatory Visit

## 2014-09-21 ENCOUNTER — Other Ambulatory Visit: Payer: BLUE CROSS/BLUE SHIELD

## 2014-09-21 NOTE — H&P (Signed)
Amy [name]  Location: [facility] Surgery Patient #: [id number] DOB: [birth year] Undefined / Language: Undefined / Race: Undefined Female      History of Present Illness  The patient is a 64 year old female who presents with breast cancer. This is a very pleasant 64 year old Caucasian female, referred by Dr. Leonides Sake at the breast center of [facility] for evaluation and management of a large cancer of the left breast. Dr. Laney Pastor at urgent medical care is her PCP the patient is being seen in the [facility] today by Dr. Sonny Dandy, Dr. Valere Dross, and me. The patient states that her last mammogram was 3 years ago at the breast center of [facility]. She didn't really notice anything in her left breast except that it was a little more firm. Recent mammograms and ultrasound show a 5.7 cm mass in the left breast, centrally and laterally. The axilla looks normal. 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 Subsequent MRI shows a 6.5 cm mass in the left breast centrally extending laterally closer to the skin but not involving the skin. There are two(2) suspicious satellite masses in the area. A hiatal hernia was noted. Lymph nodes looks normal. The right breast looks normal. Past history is significant for a cerebellopontine angle tumor with craniotomy 2014. Other than slight speech problem and left facial nerve injury she is doing well. Hypertension. Former smoker. Status post hysterectomy and BSO. GERD. Family history is negative for breast or ovarian cancer, although she admits that she doesn't really know her family very well. 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 Lives with her husband who is with her today. They live in [place]. . She is disabled because of her brain surgery but she does drive a car. They have 1 child. We've discussed surgical management. She knows that she will need a mastectomy, chemotherapy, and  radiation therapy. I told her that I think that we should proceed a left total mastectomy, sentinel node biopsy, and Port-A-Cath insertion. We have talked about reconstruction. I told her that all three managing physicians agree that it would be preferable to have delayed reconstruction rather than immediate because of the intensity of her adjuvant treatments and she fully agrees with that. She says she might want to see a plastic surgeon later on after she's had her adjuvant therapies. But not now. I have discussed the indications, details, techniques, and numerous risk of left mastectomy sentinel node biopsy and Port-A-Cath insertion. She's aware the risk of bleeding, infection, pneumothorax, port malfunction, vascular injury, bleeding or infection in the breast, skin necrosis, arm disability, arm swelling, arm numbness and other unforeseen problems. She understands these issues well. At this time all of her questions are answered. She agrees with this plan.   Other Problems  Anxiety Disorder Back Pain Gastroesophageal Reflux Disease General anesthesia - complications High blood pressure Lump In Breast Migraine Headache Oophorectomy  Past Surgical History  Breast Biopsy Left. Colon Polyp Removal - Colonoscopy Foot Surgery Left. Hysterectomy (not due to cancer) - Complete Knee Surgery Bilateral. Oral Surgery Spinal Surgery - Lower Back  Diagnostic Studies History  Colonoscopy 5-10 years ago Mammogram within last year Pap Smear >5 years ago  Social History Caffeine use Coffee, Tea. No alcohol use No drug use Tobacco use Former smoker.  Family History  Family history unknown First Degree Relatives  Pregnancy / Birth History  Age at menarche 66 years. Age of menopause <47 Irregular periods  Review of Systems  General Not Present- Appetite Loss, Chills, Fatigue, Fever, Night Sweats, Weight Gain and Weight Loss. Skin Not Present- Change in Wart/Mole,  Dryness, Hives, Jaundice, New Lesions, Non-Healing Wounds, Rash and Ulcer. HEENT Present- Hearing Loss, Seasonal Allergies and Wears glasses/contact lenses. Not Present- Earache, Hoarseness, Nose Bleed, Oral Ulcers, Ringing in the Ears, Sinus Pain, Sore Throat, Visual Disturbances and Yellow Eyes. Breast Present- Breast Pain. Not Present- Breast Mass, Nipple Discharge and Skin Changes. Cardiovascular Not Present- Chest Pain, Difficulty Breathing Lying Down, Leg Cramps, Palpitations, Rapid Heart Rate, Shortness of Breath and Swelling of Extremities. Female Genitourinary Not Present- Frequency, Nocturia, Painful Urination, Pelvic Pain and Urgency. Musculoskeletal Present- Back Pain. Not Present- Joint Pain, Joint Stiffness, Muscle Pain, Muscle Weakness and Swelling of Extremities. Neurological Not Present- Decreased Memory, Fainting, Headaches, Numbness, Seizures, Tingling, Tremor, Trouble walking and Weakness. Psychiatric Present- Anxiety. Not Present- Bipolar, Change in Sleep Pattern, Depression, Fearful and Frequent crying. Endocrine Not Present- Cold Intolerance, Excessive Hunger, Hair Changes, Heat Intolerance, Hot flashes and New Diabetes. Hematology Not Present- Easy Bruising, Excessive bleeding, Gland problems, HIV and Persistent Infections.   Physical Exam  General Mental Status-Alert. General Appearance-Consistent with stated age. Hydration-Well hydrated. Voice-Normal.  Head and Neck Head-normocephalic, atraumatic with no lesions or palpable masses. Trachea-midline. Thyroid Gland Characteristics - normal size and consistency. Note: slight ptosis left eyelid. 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   Eye Eyeball - Bilateral-Extraocular movements intact. Sclera/Conjunctiva - Bilateral-No scleral icterus.  Chest and Lung Exam Chest and lung exam reveals -quiet, even and easy respiratory effort with no use of accessory muscles and on auscultation, normal  breath sounds, no adventitious sounds and normal vocal resonance. Inspection Chest Wall - Normal. Back - normal.  Breast Breast - Left-Symmetric and Biopsy scar, Non Tender, No Dimpling, No Inflammation, No Lumpectomy scars, No Mastectomy scars, No Peau d' Orange. Breast - Right-Symmetric, Non Tender, No Biopsy scars, no Dimpling, No Inflammation, No Lumpectomy scars, No Mastectomy scars, No Peau d' Orange. Note: Left breast reveals 6 cm palpable central mass, close to the skin lateral to the areola but not invading the skin. Quite mobile. Right breast exam unremarkable. No axillary adenopathy on either side.   Cardiovascular Cardiovascular examination reveals -normal heart sounds, regular rate and rhythm with no murmurs and normal pedal pulses bilaterally.  Abdomen Inspection Inspection of the abdomen reveals - No Hernias. Skin - Scar - no surgical scars. Palpation/Percussion Palpation and Percussion of the abdomen reveal - Soft, Non Tender, No Rebound tenderness, No Rigidity (guarding) and No hepatosplenomegaly. Auscultation Auscultation of the abdomen reveals - Bowel sounds normal.  Neurologic Neurologic evaluation reveals -alert and oriented x 3 with no impairment of recent or remote memory. Mental Status-Normal.  Musculoskeletal Normal Exam - Left-Upper Extremity Strength Normal and Lower Extremity Strength Normal. Normal Exam - Right-Upper Extremity Strength Normal and Lower Extremity Strength Normal.  Lymphatic Head & Neck  General Head & Neck Lymphatics: Bilateral - Description - Normal. Axillary  General Axillary Region: Bilateral - Description - Normal. Tenderness - Non Tender. Femoral & Inguinal  Generalized Femoral & Inguinal Lymphatics: Bilateral - Description - Normal. Tenderness - Non Tender.    Assessment & Plan  MALIGNANT NEOPLASM OF CENTRAL PORTION OF LEFT FEMALE BREAST (174.1  C50.112) Current Plans  Schedule for Surgery you have a  large tumor in your left breast, 6.5 cm. There are 2 smaller satellite lesions nearby. Your right breast looks normal. Your lymph nodes look normal. We have discussed surgical approaches. Dr.  Dalbert Batman recommends left total mastectomy and sentinel node biopsy. We have advised against immediate reconstruction, but that can be considered later after your chemotherapy and radiation therapy. You'll be scheduled for left total mastectomy, left axillary sentinel node biopsy, and Port-A-Cath insertion. We have discussed the techniques and risks of this surgery in detail Dr. Darrel Hoover office will call you tomorrow to begin the scheduling process  HISTORY OF CRANIOTOMY (V45.89  Z98.89)  HYPERTENSION, BENIGN (401.1  I10)  CHRONIC GERD (530.81  K21.9)  FORMER SMOKER (V15.82  Z87.891)  HISTORY OF TOTAL ABDOMINAL HYSTERECTOMY AND BILATERAL SALPINGO-OOPHORECTOMY (V88.01  Z90.710)    Edsel Petrin. Dalbert Batman, M.D., [facility] General and Minimally invasive Surgery Breast and Colorectal Surgery Office:   [phone] Pager:   [phone]

## 2014-09-21 NOTE — Telephone Encounter (Signed)
Meds ordered this encounter  Medications  . ALPRAZolam (XANAX) 0.25 MG tablet    Sig: TAKE 1 TAB TWICE A DAY AS NEEDED    Dispense:  60 tablet    Refill:  0    Not to exceed 5 additional fills before 02/24/2015

## 2014-09-21 NOTE — Progress Notes (Addendum)
Anesthesia chart review:  Patient is a 64 year old female scheduled for left total mastectomy, left axillary SN biopsy, insertion of a Port-a-cath on 2/12/1 by Dr. Dalbert Batman.  History includes left breast cancer, former smoker, HTN, GERD, cerebral meningioma (auditory) s/p excision '14, fibromyalgia, hiatal hernia, anxiety, post-operative N/V, migraines, right TRA '10, lumbar fusion '89.  Patient is adopted. PCP is listed as Dr. Tami Lin. HEM-ONC is Dr. Lindi Adie.   09/20/14 EKG: NSR, non-specific ST/T wave abnormality, prolonged QT. The interpreting cardiologist did not feel that it was significantly changed from her last racing in 2013.    She is scheduled for a pre-chemo echo, currently scheduled to be done tomorrow.   09/20/14 CXR: No active cardiopulmonary disease. Hiatal hernia.  Preoperative labs noted.  A1C on 08/16/14 was 5.5.  Based on currently available information, I anticipate that she can proceed as planned. I'll leave chart for follow-up of echo results as available.  [facility] [place] Short Stay Center/Anesthesiology Phone [phone] 09/21/2014 3:28 PM  Addendum: Echo from 09/22/14 showed: - Left ventricle: The cavity size was normal. There was mild focal basal hypertrophy of the septum. Systolic function was normal. The estimated ejection fraction was in the range of 50% to 55%. Wall motion was normal; there were no regional wall motion abnormalities. Doppler parameters are consistent with abnormal left ventricular relaxation (grade 1 diastolic dysfunction). Doppler parameters are consistent with elevated ventricular end-diastolic filling pressure. - Aortic valve: Trileaflet; normal thickness leaflets. There was no regurgitation. - Aortic root: The aortic root was normal in size. - Mitral valve: Structurally normal valve. There was no regurgitation. - Left atrium: The atrium was mildly dilated. - Right ventricle: Systolic function was  normal. - Right atrium: The atrium was normal in size. - Tricuspid valve: There was trivial regurgitation. - Pulmonic valve: There was no regurgitation. - Pulmonary arteries: Systolic pressure was within the normal range. - Inferior vena cava: The vessel was normal in size. - Pericardium, extracardiac: There was no pericardial effusion.  As above, I anticipate that she can proceed as planned.  [facility] Short Stay Center/Anesthesiology Phone [phone] 09/22/2014 4:14 PM

## 2014-09-22 ENCOUNTER — Encounter (HOSPITAL_COMMUNITY): Payer: Self-pay

## 2014-09-22 ENCOUNTER — Encounter (HOSPITAL_COMMUNITY)
Admission: RE | Admit: 2014-09-22 | Discharge: 2014-09-22 | Disposition: A | Discharge status: Home / Self Care | Payer: BLUE CROSS/BLUE SHIELD | Source: Ambulatory Visit | Attending: Hematology and Oncology | Admitting: Hematology and Oncology

## 2014-09-22 ENCOUNTER — Ambulatory Visit (HOSPITAL_COMMUNITY)
Admission: RE | Admit: 2014-09-22 | Discharge: 2014-09-22 | Disposition: A | Discharge status: Home / Self Care | Payer: BLUE CROSS/BLUE SHIELD | Source: Ambulatory Visit | Attending: Hematology and Oncology | Admitting: Hematology and Oncology

## 2014-09-22 DIAGNOSIS — I10 Essential (primary) hypertension: Secondary | ICD-10-CM | POA: Diagnosis not present

## 2014-09-22 DIAGNOSIS — C50919 Malignant neoplasm of unspecified site of unspecified female breast: Secondary | ICD-10-CM | POA: Diagnosis not present

## 2014-09-22 DIAGNOSIS — C50412 Malignant neoplasm of upper-outer quadrant of left female breast: Secondary | ICD-10-CM

## 2014-09-22 DIAGNOSIS — C50912 Malignant neoplasm of unspecified site of left female breast: Secondary | ICD-10-CM | POA: Diagnosis not present

## 2014-09-22 DIAGNOSIS — K449 Diaphragmatic hernia without obstruction or gangrene: Secondary | ICD-10-CM | POA: Diagnosis not present

## 2014-09-22 DIAGNOSIS — Z170 Estrogen receptor positive status [ER+]: Secondary | ICD-10-CM | POA: Diagnosis not present

## 2014-09-22 DIAGNOSIS — F419 Anxiety disorder, unspecified: Secondary | ICD-10-CM | POA: Diagnosis not present

## 2014-09-22 DIAGNOSIS — Z87891 Personal history of nicotine dependence: Secondary | ICD-10-CM | POA: Diagnosis not present

## 2014-09-22 DIAGNOSIS — Z0189 Encounter for other specified special examinations: Secondary | ICD-10-CM | POA: Diagnosis not present

## 2014-09-22 DIAGNOSIS — K219 Gastro-esophageal reflux disease without esophagitis: Secondary | ICD-10-CM | POA: Diagnosis not present

## 2014-09-22 MED ORDER — IOHEXOL 300 MG/ML  SOLN
100.0000 mL | Freq: Once | INTRAMUSCULAR | Status: AC | PRN
  Administered 2014-09-22: 100 mL via INTRAVENOUS

## 2014-09-22 MED ORDER — TECHNETIUM TC 99M MEDRONATE IV KIT
27.4000 | PACK | Freq: Once | INTRAVENOUS | Status: AC | PRN
  Administered 2014-09-22: 27.4 via INTRAVENOUS

## 2014-09-22 MED ORDER — CEFAZOLIN SODIUM-DEXTROSE 2-3 GM-% IV SOLR
2.0000 g | INTRAVENOUS | Status: AC
  Administered 2014-09-23: 2 g via INTRAVENOUS
  Filled 2014-09-22: qty 50

## 2014-09-22 NOTE — Telephone Encounter (Signed)
I believe pt probably advised she had a mastectomy, not vasectomy, but this RF was faxed in this morning. Notified pt on VM and will forward this to [facility] for her information.

## 2014-09-22 NOTE — Telephone Encounter (Signed)
Faxed

## 2014-09-22 NOTE — Progress Notes (Signed)
Echocardiogram 2D Echocardiogram has been performed.  Amy [name] 09/22/2014, 10:39 AM

## 2014-09-23 ENCOUNTER — Ambulatory Visit (HOSPITAL_COMMUNITY): Payer: BLUE CROSS/BLUE SHIELD

## 2014-09-23 ENCOUNTER — Encounter (HOSPITAL_COMMUNITY)
Admission: RE | Disposition: A | Discharge status: Home / Self Care | Payer: Self-pay | Source: Ambulatory Visit | Attending: General Surgery

## 2014-09-23 ENCOUNTER — Encounter (HOSPITAL_COMMUNITY): Payer: Self-pay | Admitting: General Practice

## 2014-09-23 ENCOUNTER — Encounter (HOSPITAL_COMMUNITY)
Admission: RE | Admit: 2014-09-23 | Discharge: 2014-09-23 | Disposition: A | Discharge status: Home / Self Care | Payer: BLUE CROSS/BLUE SHIELD | Source: Ambulatory Visit | Attending: General Surgery | Admitting: General Surgery

## 2014-09-23 ENCOUNTER — Ambulatory Visit (HOSPITAL_COMMUNITY): Payer: BLUE CROSS/BLUE SHIELD | Admitting: Anesthesiology

## 2014-09-23 ENCOUNTER — Ambulatory Visit (HOSPITAL_COMMUNITY)
Admission: RE | Admit: 2014-09-23 | Discharge: 2014-09-24 | Disposition: A | Discharge status: Home / Self Care | Payer: BLUE CROSS/BLUE SHIELD | Source: Ambulatory Visit | Attending: General Surgery | Admitting: General Surgery

## 2014-09-23 ENCOUNTER — Ambulatory Visit (HOSPITAL_COMMUNITY): Payer: BLUE CROSS/BLUE SHIELD | Admitting: Vascular Surgery

## 2014-09-23 DIAGNOSIS — C50912 Malignant neoplasm of unspecified site of left female breast: Secondary | ICD-10-CM

## 2014-09-23 DIAGNOSIS — Z87891 Personal history of nicotine dependence: Secondary | ICD-10-CM | POA: Insufficient documentation

## 2014-09-23 DIAGNOSIS — I10 Essential (primary) hypertension: Secondary | ICD-10-CM | POA: Diagnosis present

## 2014-09-23 DIAGNOSIS — K219 Gastro-esophageal reflux disease without esophagitis: Secondary | ICD-10-CM | POA: Insufficient documentation

## 2014-09-23 DIAGNOSIS — C50412 Malignant neoplasm of upper-outer quadrant of left female breast: Secondary | ICD-10-CM | POA: Diagnosis present

## 2014-09-23 DIAGNOSIS — Z170 Estrogen receptor positive status [ER+]: Secondary | ICD-10-CM | POA: Diagnosis not present

## 2014-09-23 DIAGNOSIS — G8918 Other acute postprocedural pain: Secondary | ICD-10-CM | POA: Diagnosis not present

## 2014-09-23 DIAGNOSIS — K449 Diaphragmatic hernia without obstruction or gangrene: Secondary | ICD-10-CM | POA: Insufficient documentation

## 2014-09-23 DIAGNOSIS — C50112 Malignant neoplasm of central portion of left female breast: Secondary | ICD-10-CM

## 2014-09-23 DIAGNOSIS — F419 Anxiety disorder, unspecified: Secondary | ICD-10-CM | POA: Insufficient documentation

## 2014-09-23 DIAGNOSIS — G510 Bell's palsy: Secondary | ICD-10-CM

## 2014-09-23 DIAGNOSIS — Z95828 Presence of other vascular implants and grafts: Secondary | ICD-10-CM

## 2014-09-23 HISTORY — DX: Other chronic pain: G89.29

## 2014-09-23 HISTORY — DX: Malignant neoplasm of unspecified site of left female breast: C50.912

## 2014-09-23 HISTORY — PX: SIMPLE MASTECTOMY WITH AXILLARY SENTINEL NODE BIOPSY: SHX6098

## 2014-09-23 HISTORY — DX: Low back pain: M54.5

## 2014-09-23 HISTORY — PX: PORTACATH PLACEMENT: SHX2246

## 2014-09-23 HISTORY — DX: Low back pain, unspecified: M54.50

## 2014-09-23 HISTORY — DX: Facial weakness: R29.810

## 2014-09-23 HISTORY — DX: Migraine, unspecified, not intractable, without status migrainosus: G43.909

## 2014-09-23 HISTORY — DX: Pain in left foot: M79.672

## 2014-09-23 HISTORY — PX: MASTECTOMY COMPLETE / SIMPLE W/ SENTINEL NODE BIOPSY: SUR846

## 2014-09-23 LAB — CBC
HCT: 36.2 % (ref 36.0–46.0)
Hemoglobin: 11.9 g/dL — ABNORMAL LOW (ref 12.0–15.0)
MCH: 28.4 pg (ref 26.0–34.0)
MCHC: 32.9 g/dL (ref 30.0–36.0)
MCV: 86.4 fL (ref 78.0–100.0)
PLATELETS: 451 10*3/uL — AB (ref 150–400)
RBC: 4.19 MIL/uL (ref 3.87–5.11)
RDW: 13 % (ref 11.5–15.5)
WBC: 10.5 10*3/uL (ref 4.0–10.5)

## 2014-09-23 SURGERY — SIMPLE MASTECTOMY WITH AXILLARY SENTINEL NODE BIOPSY
Anesthesia: General | Site: Chest | Laterality: Right

## 2014-09-23 MED ORDER — ALPRAZOLAM 0.25 MG PO TABS: 0.2500 mg | ORAL_TABLET | Freq: Every evening | ORAL | Status: DC | PRN

## 2014-09-23 MED ORDER — PROPOFOL 10 MG/ML IV BOLUS
INTRAVENOUS | Status: AC
  Filled 2014-09-23: qty 20

## 2014-09-23 MED ORDER — HYDROMORPHONE HCL 1 MG/ML IJ SOLN
0.2500 mg | INTRAMUSCULAR | Status: DC | PRN
  Administered 2014-09-23 (×4): 0.5 mg via INTRAVENOUS

## 2014-09-23 MED ORDER — METHYLENE BLUE 1 % INJ SOLN
INTRAMUSCULAR | Status: AC
  Filled 2014-09-23: qty 10

## 2014-09-23 MED ORDER — BUPIVACAINE-EPINEPHRINE (PF) 0.5% -1:200000 IJ SOLN
INTRAMUSCULAR | Status: DC | PRN
  Administered 2014-09-23: 30 mL

## 2014-09-23 MED ORDER — MIDAZOLAM HCL 2 MG/2ML IJ SOLN
INTRAMUSCULAR | Status: AC
  Filled 2014-09-23: qty 2

## 2014-09-23 MED ORDER — APREPITANT 40 MG PO CAPS: 40.0000 mg | ORAL_CAPSULE | Freq: Once | ORAL | Status: DC

## 2014-09-23 MED ORDER — MIDAZOLAM HCL 5 MG/5ML IJ SOLN
INTRAMUSCULAR | Status: DC | PRN
  Administered 2014-09-23 (×2): 1 mg via INTRAVENOUS

## 2014-09-23 MED ORDER — MIDAZOLAM HCL 2 MG/2ML IJ SOLN
INTRAMUSCULAR | Status: AC
  Administered 2014-09-23: 2 mg
  Filled 2014-09-23: qty 2

## 2014-09-23 MED ORDER — OXYCODONE HCL 5 MG PO TABS: 5.0000 mg | ORAL_TABLET | Freq: Once | ORAL | Status: DC | PRN

## 2014-09-23 MED ORDER — CEFAZOLIN SODIUM-DEXTROSE 2-3 GM-% IV SOLR
2.0000 g | Freq: Three times a day (TID) | INTRAVENOUS | Status: AC
  Administered 2014-09-23 – 2014-09-24 (×3): 2 g via INTRAVENOUS
  Filled 2014-09-23 (×3): qty 50

## 2014-09-23 MED ORDER — VERAPAMIL HCL ER 180 MG PO TBCR
180.0000 mg | EXTENDED_RELEASE_TABLET | Freq: Every day | ORAL | Status: DC
  Administered 2014-09-24: 180 mg via ORAL
  Filled 2014-09-23: qty 1

## 2014-09-23 MED ORDER — MIDAZOLAM HCL 2 MG/2ML IJ SOLN
INTRAMUSCULAR | Status: AC
  Administered 2014-09-23: 1 mg
  Filled 2014-09-23: qty 2

## 2014-09-23 MED ORDER — SCOPOLAMINE 1 MG/3DAYS TD PT72
MEDICATED_PATCH | TRANSDERMAL | Status: AC
  Administered 2014-09-23: 1.5 mg via TRANSDERMAL
  Filled 2014-09-23: qty 1

## 2014-09-23 MED ORDER — PROMETHAZINE HCL 25 MG/ML IJ SOLN: 6.2500 mg | INTRAMUSCULAR | Status: DC | PRN

## 2014-09-23 MED ORDER — LIDOCAINE HCL (CARDIAC) 20 MG/ML IV SOLN
INTRAVENOUS | Status: DC | PRN
  Administered 2014-09-23: 80 mg via INTRAVENOUS

## 2014-09-23 MED ORDER — HEPARIN SOD (PORK) LOCK FLUSH 100 UNIT/ML IV SOLN
INTRAVENOUS | Status: AC
  Filled 2014-09-23: qty 5

## 2014-09-23 MED ORDER — 0.9 % SODIUM CHLORIDE (POUR BTL) OPTIME
TOPICAL | Status: DC | PRN
  Administered 2014-09-23 (×2): 1000 mL

## 2014-09-23 MED ORDER — SCOPOLAMINE 1 MG/3DAYS TD PT72
1.0000 | MEDICATED_PATCH | TRANSDERMAL | Status: DC
  Administered 2014-09-23: 1 via TRANSDERMAL
  Administered 2014-09-23: 1.5 mg via TRANSDERMAL

## 2014-09-23 MED ORDER — METOPROLOL SUCCINATE ER 100 MG PO TB24
100.0000 mg | ORAL_TABLET | Freq: Every day | ORAL | Status: DC
  Administered 2014-09-24: 100 mg via ORAL
  Filled 2014-09-23 (×2): qty 1

## 2014-09-23 MED ORDER — PROPOFOL INFUSION 10 MG/ML OPTIME
INTRAVENOUS | Status: DC | PRN
  Administered 2014-09-23: 25 ug/kg/min via INTRAVENOUS

## 2014-09-23 MED ORDER — HYDROMORPHONE HCL 1 MG/ML IJ SOLN
INTRAMUSCULAR | Status: AC
  Filled 2014-09-23: qty 1

## 2014-09-23 MED ORDER — PROPOFOL 10 MG/ML IV BOLUS
INTRAVENOUS | Status: DC | PRN
  Administered 2014-09-23: 50 mg via INTRAVENOUS
  Administered 2014-09-23: 200 mg via INTRAVENOUS
  Administered 2014-09-23: 20 mg via INTRAVENOUS

## 2014-09-23 MED ORDER — OXYCODONE-ACETAMINOPHEN 5-325 MG PO TABS
1.0000 | ORAL_TABLET | ORAL | Status: DC | PRN
  Administered 2014-09-24 (×2): 2 via ORAL
  Filled 2014-09-23 (×2): qty 2

## 2014-09-23 MED ORDER — ENOXAPARIN SODIUM 40 MG/0.4ML ~~LOC~~ SOLN
40.0000 mg | SUBCUTANEOUS | Status: DC
  Filled 2014-09-23: qty 0.4

## 2014-09-23 MED ORDER — LIDOCAINE-EPINEPHRINE (PF) 1 %-1:200000 IJ SOLN
INTRAMUSCULAR | Status: AC
  Filled 2014-09-23: qty 10

## 2014-09-23 MED ORDER — EPHEDRINE SULFATE 50 MG/ML IJ SOLN
INTRAMUSCULAR | Status: DC | PRN
  Administered 2014-09-23 (×3): 5 mg via INTRAVENOUS

## 2014-09-23 MED ORDER — ONDANSETRON HCL 4 MG/2ML IJ SOLN
4.0000 mg | Freq: Four times a day (QID) | INTRAMUSCULAR | Status: DC | PRN
  Administered 2014-09-23 – 2014-09-24 (×2): 4 mg via INTRAVENOUS
  Filled 2014-09-23 (×2): qty 2

## 2014-09-23 MED ORDER — DIPHENHYDRAMINE HCL 50 MG/ML IJ SOLN
INTRAMUSCULAR | Status: DC | PRN
  Administered 2014-09-23: 25 mg via INTRAVENOUS

## 2014-09-23 MED ORDER — ZOLPIDEM TARTRATE 5 MG PO TABS
5.0000 mg | ORAL_TABLET | Freq: Every day | ORAL | Status: DC
  Administered 2014-09-23: 5 mg via ORAL
  Filled 2014-09-23: qty 1

## 2014-09-23 MED ORDER — LACTATED RINGERS IV SOLN
INTRAVENOUS | Status: DC | PRN
  Administered 2014-09-23 (×2): via INTRAVENOUS

## 2014-09-23 MED ORDER — FENTANYL CITRATE 0.05 MG/ML IJ SOLN
100.0000 ug | Freq: Once | INTRAMUSCULAR | Status: AC
  Administered 2014-09-23: 100 ug via INTRAVENOUS

## 2014-09-23 MED ORDER — ONDANSETRON HCL 4 MG/2ML IJ SOLN
INTRAMUSCULAR | Status: DC | PRN
  Administered 2014-09-23: 4 mg via INTRAVENOUS

## 2014-09-23 MED ORDER — FENTANYL CITRATE 0.05 MG/ML IJ SOLN
INTRAMUSCULAR | Status: AC
  Administered 2014-09-23: 100 ug via INTRAVENOUS
  Filled 2014-09-23: qty 2

## 2014-09-23 MED ORDER — VERAPAMIL HCL ER 180 MG PO CP24: 180.0000 mg | ORAL_CAPSULE | Freq: Every day | ORAL | Status: DC

## 2014-09-23 MED ORDER — LISINOPRIL 5 MG PO TABS
5.0000 mg | ORAL_TABLET | Freq: Every day | ORAL | Status: DC
  Administered 2014-09-23 – 2014-09-24 (×2): 5 mg via ORAL
  Filled 2014-09-23 (×3): qty 1

## 2014-09-23 MED ORDER — MIDAZOLAM HCL 5 MG/ML IJ SOLN
1.0000 mg | Freq: Once | INTRAMUSCULAR | Status: AC
Start: 2014-09-23 — End: 2014-09-23

## 2014-09-23 MED ORDER — LIDOCAINE-EPINEPHRINE (PF) 1 %-1:200000 IJ SOLN
INTRAMUSCULAR | Status: DC | PRN
  Administered 2014-09-23: 12 mL

## 2014-09-23 MED ORDER — FENTANYL CITRATE 0.05 MG/ML IJ SOLN
INTRAMUSCULAR | Status: DC | PRN
  Administered 2014-09-23 (×5): 50 ug via INTRAVENOUS

## 2014-09-23 MED ORDER — DEXAMETHASONE SODIUM PHOSPHATE 4 MG/ML IJ SOLN
INTRAMUSCULAR | Status: DC | PRN
  Administered 2014-09-23: 8 mg via INTRAVENOUS

## 2014-09-23 MED ORDER — SODIUM CHLORIDE 0.9 % IJ SOLN
INTRAMUSCULAR | Status: AC
  Filled 2014-09-23: qty 10

## 2014-09-23 MED ORDER — LACTATED RINGERS IV SOLN
INTRAVENOUS | Status: DC
  Administered 2014-09-23: 50 mL/h via INTRAVENOUS

## 2014-09-23 MED ORDER — SODIUM CHLORIDE 0.9 % IJ SOLN
INTRAMUSCULAR | Status: DC | PRN
  Administered 2014-09-23: 5 mL

## 2014-09-23 MED ORDER — FENTANYL CITRATE 0.05 MG/ML IJ SOLN
INTRAMUSCULAR | Status: AC
  Filled 2014-09-23: qty 5

## 2014-09-23 MED ORDER — HYDROMORPHONE HCL 1 MG/ML IJ SOLN
0.5000 mg | INTRAMUSCULAR | Status: DC | PRN
  Administered 2014-09-23 – 2014-09-24 (×4): 1 mg via INTRAVENOUS
  Filled 2014-09-23 (×3): qty 1

## 2014-09-23 MED ORDER — LORATADINE 10 MG PO TABS
10.0000 mg | ORAL_TABLET | Freq: Every day | ORAL | Status: DC
  Administered 2014-09-23: 10 mg via ORAL
  Filled 2014-09-23 (×3): qty 1

## 2014-09-23 MED ORDER — HEPARIN SOD (PORK) LOCK FLUSH 100 UNIT/ML IV SOLN
INTRAVENOUS | Status: DC | PRN
  Administered 2014-09-23: 400 [IU] via INTRAVENOUS

## 2014-09-23 MED ORDER — HYDROCODONE-ACETAMINOPHEN 5-325 MG PO TABS: 1.0000 | ORAL_TABLET | ORAL | Status: DC | PRN

## 2014-09-23 MED ORDER — SODIUM CHLORIDE 0.9 % IR SOLN
Status: DC | PRN
  Administered 2014-09-23: 1000 mL

## 2014-09-23 MED ORDER — CITALOPRAM HYDROBROMIDE 40 MG PO TABS
40.0000 mg | ORAL_TABLET | Freq: Every day | ORAL | Status: DC
  Administered 2014-09-23 – 2014-09-24 (×2): 40 mg via ORAL
  Filled 2014-09-23 (×2): qty 2
  Filled 2014-09-23: qty 1
  Filled 2014-09-23: qty 2
  Filled 2014-09-23: qty 1

## 2014-09-23 MED ORDER — TECHNETIUM TC 99M SULFUR COLLOID FILTERED
1.0000 | Freq: Once | INTRAVENOUS | Status: AC | PRN
  Administered 2014-09-23: 1 via INTRADERMAL

## 2014-09-23 MED ORDER — OXYCODONE HCL 5 MG/5ML PO SOLN: 5.0000 mg | Freq: Once | ORAL | Status: DC | PRN

## 2014-09-23 MED ORDER — ONDANSETRON HCL 4 MG PO TABS
4.0000 mg | ORAL_TABLET | Freq: Four times a day (QID) | ORAL | Status: DC | PRN
  Administered 2014-09-24: 4 mg via ORAL
  Filled 2014-09-23: qty 1

## 2014-09-23 SURGICAL SUPPLY — 77 items
APPLIER CLIP 9.375 MED OPEN (MISCELLANEOUS) ×3
APR CLP MED 9.3 20 MLT OPN (MISCELLANEOUS) ×2
BAG DECANTER FOR FLEXI CONT (MISCELLANEOUS) ×3 IMPLANT
BINDER BREAST LRG (GAUZE/BANDAGES/DRESSINGS) ×3 IMPLANT
BINDER BREAST XLRG (GAUZE/BANDAGES/DRESSINGS) IMPLANT
BIOPATCH RED 1 DISK 7.0 (GAUZE/BANDAGES/DRESSINGS) ×3 IMPLANT
BLADE SURG 11 STRL SS (BLADE) ×3 IMPLANT
BLADE SURG 15 STRL LF DISP TIS (BLADE) ×2 IMPLANT
BLADE SURG 15 STRL SS (BLADE) ×3
BLADE SURG ROTATE 9660 (MISCELLANEOUS) IMPLANT
CANISTER SUCTION 2500CC (MISCELLANEOUS) ×3 IMPLANT
CHLORAPREP W/TINT 26ML (MISCELLANEOUS) ×3 IMPLANT
CLIP APPLIE 9.375 MED OPEN (MISCELLANEOUS) ×2 IMPLANT
CONT SPEC 4OZ CLIKSEAL STRL BL (MISCELLANEOUS) ×9 IMPLANT
COVER PROBE W GEL 5X96 (DRAPES) ×3 IMPLANT
COVER SURGICAL LIGHT HANDLE (MISCELLANEOUS) ×3 IMPLANT
COVER TRANSDUCER ULTRASND (DRAPES) ×3 IMPLANT
COVER TRANSDUCER ULTRASND GEL (DRAPE) IMPLANT
CRADLE DONUT ADULT HEAD (MISCELLANEOUS) ×3 IMPLANT
DEVICE DISSECT PLASMABLAD 3.0S (MISCELLANEOUS) ×2 IMPLANT
DRAIN CHANNEL 19F RND (DRAIN) ×6 IMPLANT
DRAPE C-ARM 42X72 X-RAY (DRAPES) ×3 IMPLANT
DRAPE CHEST BREAST 15X10 FENES (DRAPES) ×3 IMPLANT
DRAPE LAPAROSCOPIC ABDOMINAL (DRAPES) ×3 IMPLANT
DRAPE UTILITY XL STRL (DRAPES) ×6 IMPLANT
DRSG PAD ABDOMINAL 8X10 ST (GAUZE/BANDAGES/DRESSINGS) ×3 IMPLANT
DRSG TEGADERM 4X4.75 (GAUZE/BANDAGES/DRESSINGS) ×3 IMPLANT
ELECT BLADE 4.0 EZ CLEAN MEGAD (MISCELLANEOUS) ×3
ELECT CAUTERY BLADE 6.4 (BLADE) ×3 IMPLANT
ELECT REM PT RETURN 9FT ADLT (ELECTROSURGICAL) ×3
ELECTRODE BLDE 4.0 EZ CLN MEGD (MISCELLANEOUS) ×2 IMPLANT
ELECTRODE REM PT RTRN 9FT ADLT (ELECTROSURGICAL) ×2 IMPLANT
EVACUATOR SILICONE 100CC (DRAIN) ×6 IMPLANT
GAUZE SPONGE 4X4 16PLY XRAY LF (GAUZE/BANDAGES/DRESSINGS) ×3 IMPLANT
GLOVE EUDERMIC 7 POWDERFREE (GLOVE) ×3 IMPLANT
GLOVE SURG SIGNA 7.5 PF LTX (GLOVE) ×3 IMPLANT
GOWN STRL REUS W/ TWL LRG LVL3 (GOWN DISPOSABLE) ×4 IMPLANT
GOWN STRL REUS W/ TWL XL LVL3 (GOWN DISPOSABLE) ×2 IMPLANT
GOWN STRL REUS W/TWL LRG LVL3 (GOWN DISPOSABLE) ×4
GOWN STRL REUS W/TWL XL LVL3 (GOWN DISPOSABLE) ×2
INTRODUCER 13FR (MISCELLANEOUS) IMPLANT
INTRODUCER COOK 11FR (CATHETERS) IMPLANT
KIT BASIN OR (CUSTOM PROCEDURE TRAY) ×3 IMPLANT
KIT PORT POWER 8FR ISP CVUE (Catheter) ×3 IMPLANT
KIT PORT POWER 9.6FR MRI PREA (Catheter) IMPLANT
KIT PORT POWER ISP 8FR (Catheter) IMPLANT
KIT POWER CATH 8FR (Catheter) IMPLANT
KIT ROOM TURNOVER OR (KITS) ×3 IMPLANT
LIQUID BAND (GAUZE/BANDAGES/DRESSINGS) ×3 IMPLANT
NEEDLE 18GX1X1/2 (RX/OR ONLY) (NEEDLE) ×3 IMPLANT
NEEDLE HYPO 25GX1X1/2 BEV (NEEDLE) ×6 IMPLANT
NS IRRIG 1000ML POUR BTL (IV SOLUTION) ×3 IMPLANT
PACK GENERAL/GYN (CUSTOM PROCEDURE TRAY) ×3 IMPLANT
PACK SURGICAL SETUP 50X90 (CUSTOM PROCEDURE TRAY) ×3 IMPLANT
PAD ARMBOARD 7.5X6 YLW CONV (MISCELLANEOUS) ×3 IMPLANT
PENCIL BUTTON HOLSTER BLD 10FT (ELECTRODE) ×3 IMPLANT
PLASMABLADE 3.0S (MISCELLANEOUS) ×3
SET INTRODUCER 12FR PACEMAKER (SHEATH) IMPLANT
SET SHEATH INTRODUCER 10FR (MISCELLANEOUS) IMPLANT
SHEATH COOK PEEL AWAY SET 9F (SHEATH) IMPLANT
SPECIMEN JAR X LARGE (MISCELLANEOUS) ×3 IMPLANT
SPONGE GAUZE 4X4 12PLY STER LF (GAUZE/BANDAGES/DRESSINGS) ×3 IMPLANT
STAPLER VISISTAT 35W (STAPLE) ×3 IMPLANT
SURGILUBE 3G PEEL PACK STRL (MISCELLANEOUS) IMPLANT
SUT ETHILON 3 0 FSL (SUTURE) ×6 IMPLANT
SUT MNCRL AB 4-0 PS2 18 (SUTURE) ×6 IMPLANT
SUT PROLENE 2 0 CT2 30 (SUTURE) ×3 IMPLANT
SUT SILK 2 0 FS (SUTURE) ×3 IMPLANT
SUT VIC AB 3-0 SH 18 (SUTURE) ×9 IMPLANT
SYR 5ML LUER SLIP (SYRINGE) ×3 IMPLANT
SYR CONTROL 10ML LL (SYRINGE) ×3 IMPLANT
SYRINGE 10CC LL (SYRINGE) ×6 IMPLANT
TOWEL OR 17X24 6PK STRL BLUE (TOWEL DISPOSABLE) ×3 IMPLANT
TOWEL OR 17X26 10 PK STRL BLUE (TOWEL DISPOSABLE) ×3 IMPLANT
TUBE CONNECTING 12X1/4 (SUCTIONS) IMPLANT
WATER STERILE IRR 1000ML POUR (IV SOLUTION) IMPLANT
YANKAUER SUCT BULB TIP NO VENT (SUCTIONS) IMPLANT

## 2014-09-23 NOTE — Anesthesia Procedure Notes (Addendum)
Procedure Name: LMA Insertion Date/Time: 09/23/2014 1:16 PM Performed by: Garner Nash Pre-anesthesia Checklist: Timeout performed, Emergency Drugs available, Suction available, Patient being monitored and Patient identified Patient Re-evaluated:Patient Re-evaluated prior to inductionOxygen Delivery Method: Circle system utilized Preoxygenation: Pre-oxygenation with 100% oxygen Intubation Type: IV induction Ventilation: Mask ventilation without difficulty LMA Size: 4.0 Number of attempts: 1 Placement Confirmation: positive ETCO2,  CO2 detector and breath sounds checked- equal and bilateral Tube secured with: Tape Dental Injury: Teeth and Oropharynx as per pre-operative assessment    Anesthesia Regional Block:  Pectoralis block  Pre-Anesthetic Checklist: ,, timeout performed, Correct Patient, Correct Site, Correct Laterality, Correct Procedure, Correct Position, site marked, Risks and benefits discussed,  Surgical consent,  Pre-op evaluation,  At surgeon's request and post-op pain management  Laterality: Left  Prep: chloraprep       Needles:   Needle Type: Echogenic Needle     Needle Length: 9cm 9 cm Needle Gauge: 21 and 21 G    Additional Needles:  Procedures: ultrasound guided (picture in chart) Pectoralis block Narrative:  Start time: 09/23/2014 12:35 PM End time: 09/23/2014 1:45 PM Injection made incrementally with aspirations every 5 mL.  Performed by: Personally  Anesthesiologist: Suzette Battiest E  Additional Notes: Risks and benefits explained to pt. Pt tolerated procedure without immediate complications.

## 2014-09-23 NOTE — Transfer of Care (Signed)
Immediate Anesthesia Transfer of Care Note  Patient: Amy [name]  Procedure(s) Performed: Procedure(s): LEFT TOTAL MASTECTOMY WITH LEFT AXILLARY SENTINEL LYMPH NODE BIOPSY (Left) INSERTION PORT-A-CATH RIGHT SUBCLAVIAN (Right)  Patient Location: PACU  Anesthesia Type:General  Level of Consciousness: awake, alert , oriented and patient cooperative  Airway & Oxygen Therapy: Patient Spontanous Breathing and Patient connected to nasal cannula oxygen  Post-op Assessment: Report given to RN and Post -op Vital signs reviewed and stable  Post vital signs: Reviewed and stable  Last Vitals:  Filed Vitals:   09/23/14 1537  BP:   Pulse:   Temp: 36.4 C  Resp:     Complications: No apparent anesthesia complications

## 2014-09-23 NOTE — Interval H&P Note (Signed)
History and Physical Interval Note:  09/23/2014 11:30 AM  Amy [name]  has presented today for surgery, with the diagnosis of cancer left breast  The goals and the various methods of treatment have been discussed with the patient and family. After consideration of risks, benefits and other options for treatment, the patient has consented to  Procedure(s): LEFT TOTAL  MASTECTOMY LEFT AXILLARY SENTINEL NODE BIOPSY (Left) INSERTION PORT-A-CATH ULTRA SOUND (N/A) as a surgical intervention .  The patient's history has been reviewed, patient examined today, no change in status, stable for surgery.  I have reviewed the patient's chart and labs.  Questions were answered to the patient's satisfaction.     Adin Hector

## 2014-09-23 NOTE — Op Note (Signed)
Patient Name:           Amy [name]   Date of Surgery:        09/23/2014  Pre op Diagnosis:      Invasive ductal carcinoma left breast, stage T3, N0, receptor positive, HER-2 negative  Post op Diagnosis:    Same  Procedure:                 Insertion of 8 French tunneled power port ClearVue  venous vascular access device                                      Use of fluoroscopy for guidance and positioning                                       Inject blue dye left breast                                       Left total mastectomy                                       Left axillary sentinel lymph node biopsy  Surgeon:                     Edsel Petrin. Dalbert Batman, M.D., FACS  Assistant:                      Coralie Keens, M.D., FACS  Operative Indications:   T. This is a very pleasant 64 year old Caucasian female, referred by Dr. Leonides Sake at the breast center of [facility] for evaluation and management of a large cancer of the left breast. Dr. Laney Pastor at urgent medical care is her PCP the patient was seen recently  in the [facility]  by Dr. Lindi Adie, Dr. Valere Dross, and me.She didn't really notice anything in her left breast except that it was a little more firm. Recent mammograms and ultrasound show a 5.7 cm mass in the left breast, centrally and laterally. The axilla looks normal. 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 Subsequent MRI shows a 6.5 cm mass in the left breast centrally extending laterally closer to the skin but not involving the skin. There are two(2) suspicious satellite masses in the area.  Lymph nodes looks normal. The right breast looks normal. Examination reveals a large palpable mass in the central and lateral left breast that is quite mobile, not fixed to the pectoralis muscle. Close to the skin but not invading. No palpable lymph nodes.      Past history is significant for a cerebellopontine angle tumor with craniotomy 2014.   Hypertension. Former smoker. Status post hysterectomy and BSO. GERD. Family history is negative for breast or ovarian cancer, but she was a foster child   We've discussed surgical management. She knows that she will need a mastectomy, chemotherapy, and radiation therapy. I told her that I think that we should proceed a left total mastectomy, sentinel node biopsy, and Port-A-Cath insertion. We have talked about reconstruction. I told her that all three managing physicians agree that it  would be preferable to have delayed reconstruction rather than immediate because of the intensity of her adjuvant treatments and she fully agrees with that. She says she might want to see a plastic surgeon later on after she's had her adjuvant therapies.  Operative Findings:       The port was placed through the right subclavian vein and appears to be well positioned and functioning normally in the operating room. The large tumor in the lateral left breast came off the chest wall quite cleanly and did not appear to be invading. I felt that we got widely around it on the skin is well. There were some slightly enlarged lymph nodes in the left axilla, 2 sentinel lymph nodes and probably 3 other lymph nodes were removed.  Procedure in Detail:          Following the induction of general endotracheal anesthesia intravenous antibiotics were given and a surgical timeout was performed. The patient was positioned with a small roll behind her shoulders and her arms tucked at her side. Following alcohol prep I injected 5 mL of blue dye into the left breast, subareolar area. This was 2 mL of methylene blue mixed with 3 mL of saline, and the breast was massaged for a few minutes. Technetium 99 had been injected in the left breast in the holding area by the nuclear medicine technician.       The neck and entire chest were then prepped and draped in a sterile fashion. 0.5% Marcaine with epinephrine was used as a local infiltration anesthetic.  The right subclavian venipuncture was performed with a single pass and the guidewire inserted into the central venous circulation under fluoroscopic guidance. A small incision was made at the wire insertion site. Using fluoroscopy to create a template on the chest wall, I drew where I wanted the catheter tip to be in the superior vena cava near the right atrium. A transverse incision was made below the right clavicle and a subcutaneous pocket was created for the port. Using a tunneling device I passed the catheter from the wire insertion site to the port pocket site. Using the template I drew on the chest wall I positioned the catheter were thought it should be and cut the catheter 19.5 cm in length. The catheter was secured to the port with the locking device and flushed with heparinized saline. The port was sutured to the pectoralis fascia with Prolene sutures. The dilator and peel-away sheath were inserted over the guidewire into the central venous circulation. The wire and dilator were removed and the catheter threaded into the peel-away sheath and the peel-away sheath removed. The catheter flushed well and had excellent blood return. Fluoroscopy confirmed that the catheter tip was in the superior vena cava near the right atrium but there is no deformity anywhere along the course of the catheter. The port and catheter were then flushed with concentrated heparin. The subcutaneous tissue was closed with 3-0 Vicryl sutures and skin incisions closed with subcuticular 4-0 Monocryl and Dermabond.      We then took the drapes off and brought both arms at her sides. We reprepped and redraped the neck, left chest and left axilla and lateral chest wall. Another surgical timeout was performed. I planned a left mastectomy elliptical incision be careful to encompass all of the skin overlying the tumor and the nipple and areola. I then made the incision with the knife. Flaps were raised superiorly to the infraclavicular  area, medially to the parasternal area,  inferiorly to the anterior rectus sheath, and laterally to latissimus dorsi muscle. The breast was then dissected off of the pectoralis major and minor muscles with electrocautery. Using the neoprobe I went up in the axilla and found two very hot and very blue sentinel nodes. There were a couple of other lymph nodes that seemed slightly  enlarged  but the did not contain any  radioactivity. I took them out as well. I then completed the dissection removing the entire breast and the tail of Spence. I marked the lateral skin margin with a silk suture. Specimen was sent to the lab. The wound was copiously irrigated with saline. Hemostasis was excellent and achieved with electrocautery and a few metallic clips. Two19 [place] Blake drains were placed, one up into the axillary area and one across the skin flaps. These were brought out through separate stab incisions inferolaterally and sutured to the skin with nylon sutures and connected to suction bulbs. Mastectomy incision was closed with an inner layer of 3-0 Vicryl and the skin was closed with a running subcuticular 4-0 Monocryl and Dermabond. Breast binder was placed and patient taken to PACU in stable condition. EBL 150 mL or less. Counts correct. Complications none.     Edsel Petrin. Dalbert Batman, M.D., FACS General and Minimally Invasive Surgery Breast and Colorectal Surgery  09/23/2014 3:28 PM

## 2014-09-23 NOTE — Anesthesia Preprocedure Evaluation (Addendum)
Anesthesia Evaluation  Patient identified by MRN, date of birth, ID band Patient awake    Reviewed: Allergy & Precautions, NPO status , Patient's Chart, lab work & pertinent test results, reviewed documented beta blocker date and time   History of Anesthesia Complications (+) PONV  Airway Mallampati: II  TM Distance: >3 FB Neck ROM: Full    Dental  (+) Teeth Intact, Dental Advisory Given   Pulmonary former smoker,          Cardiovascular hypertension, Pt. on medications and Pt. on home beta blockers     Neuro/Psych  Headaches, Anxiety    GI/Hepatic Neg liver ROS, hiatal hernia, GERD-  ,  Endo/Other  negative endocrine ROS  Renal/GU negative Renal ROS     Musculoskeletal  (+) Fibromyalgia -  Abdominal   Peds  Hematology negative hematology ROS (+)   Anesthesia Other Findings   Reproductive/Obstetrics                            Anesthesia Physical Anesthesia Plan  ASA: II  Anesthesia Plan: General   Post-op Pain Management:    Induction: Intravenous  Airway Management Planned: LMA  Additional Equipment:   Intra-op Plan:   Post-operative Plan: Extubation in OR  Informed Consent: I have reviewed the patients History and Physical, chart, labs and discussed the procedure including the risks, benefits and alternatives for the proposed anesthesia with the patient or authorized representative who has indicated his/her understanding and acceptance.   Dental advisory given  Plan Discussed with: CRNA, Surgeon and Anesthesiologist  Anesthesia Plan Comments:        Anesthesia Quick Evaluation

## 2014-09-23 NOTE — Anesthesia Postprocedure Evaluation (Signed)
  Anesthesia Post-op Note  Patient: Amy [name]  Procedure(s) Performed: Procedure(s): LEFT TOTAL MASTECTOMY WITH LEFT AXILLARY SENTINEL LYMPH NODE BIOPSY (Left) INSERTION PORT-A-CATH RIGHT SUBCLAVIAN (Right)  Patient Location: PACU  Anesthesia Type:GA combined with regional for post-op pain  Level of Consciousness: awake and alert   Airway and Oxygen Therapy: Patient Spontanous Breathing  Post-op Pain: none  Post-op Assessment: Post-op Vital signs reviewed  Post-op Vital Signs: Reviewed  Last Vitals:  Filed Vitals:   09/23/14 1537  BP: 138/86  Pulse: 105  Temp: 36.4 C  Resp: 13    Complications: No apparent anesthesia complications

## 2014-09-24 DIAGNOSIS — Z170 Estrogen receptor positive status [ER+]: Secondary | ICD-10-CM | POA: Diagnosis not present

## 2014-09-24 DIAGNOSIS — C50912 Malignant neoplasm of unspecified site of left female breast: Secondary | ICD-10-CM | POA: Diagnosis not present

## 2014-09-24 DIAGNOSIS — Z87891 Personal history of nicotine dependence: Secondary | ICD-10-CM | POA: Diagnosis not present

## 2014-09-24 DIAGNOSIS — K449 Diaphragmatic hernia without obstruction or gangrene: Secondary | ICD-10-CM | POA: Diagnosis not present

## 2014-09-24 DIAGNOSIS — I10 Essential (primary) hypertension: Secondary | ICD-10-CM | POA: Diagnosis not present

## 2014-09-24 DIAGNOSIS — K219 Gastro-esophageal reflux disease without esophagitis: Secondary | ICD-10-CM | POA: Diagnosis not present

## 2014-09-24 LAB — CREATININE, SERUM
Creatinine, Ser: 0.68 mg/dL (ref 0.50–1.10)
GFR calc non Af Amer: >90 mL/min (ref >90)

## 2014-09-24 MED ORDER — OXYCODONE-ACETAMINOPHEN 5-325 MG PO TABS: 1.0000 | ORAL_TABLET | ORAL | Status: DC | PRN

## 2014-09-24 MED ORDER — ONDANSETRON HCL 4 MG PO TABS: 4.0000 mg | ORAL_TABLET | Freq: Three times a day (TID) | ORAL | Status: DC | PRN

## 2014-09-24 NOTE — Discharge Planning (Signed)
Copy of AVS to pt who verbalizes understanding. Instructed on JP drains and her and husband understand and demo drain emptying.  Given rx and one called to pt's pharmacy.  Dc per w/c to private car home with all personal belongings, abd's and drain care material.

## 2014-09-24 NOTE — Discharge Summary (Signed)
Physician Discharge Summary  Amy [name] GNF:[id number] DOB: 1951/02/01 DOA: 09/23/2014  PCP: Leandrew Koyanagi, MD  Consultation: none  Admit date: 09/23/2014 Discharge date: 09/24/2014  Recommendations for Outpatient Follow-up:   Follow-up Information    Follow up with Adin Hector, MD.   Specialty:  General Surgery   Contact information:   [place] [address] [phone]      Discharge Diagnoses:  1. Left breast cancer    Surgical Procedure: Insertion of 8 French tunneled power port ClearVue venous vascular access device  Use of fluoroscopy for guidance and positioning  Inject blue dye left breast  Left total mastectomy  Left axillary sentinel lymph node biopsy----Dr. Dalbert Batman 09/23/14  Discharge Condition: stable Disposition: home  Diet recommendation: regular  Filed Weights   09/23/14 1102 09/23/14 1836  Weight: 160 lb 6.4 oz (72.757 kg) 173 lb (78.472 kg)     Filed Vitals:   09/24/14 0539  BP: 133/87  Pulse: 96  Temp: 97.7 F (36.5 C)  Resp: 16     Hospital Course:  Amy [name] presented for scheduled left mastectomy and PAC placement with Dr. Dalbert Batman for invasive ductal carcinoma.  She tolerated the procedure well and was transferred to the floor.  She remained stable.  On POD#1 the patient was afebrile, VSS, pain controlled, ambulating and tolerating a diet.  Drains were putting out serosanguinous output.  She was therefore felt stable for discharge.  She was instructed on incision care and drain care.  She was asked to call our office on Monday to verify her follow up.  Medication risks, benefits and therapeutic alternatives were reviewed with the patient.  She verbalizes understanding. We discussed warning signs that warrant immediate attention. She was encouraged to  call with questions or concerns.     Discharge Instructions     Medication List    TAKE these medications        ALPRAZolam 0.25 MG tablet  Commonly known as:  XANAX  TAKE 1 TAB TWICE A DAY AS NEEDED     cetirizine 10 MG tablet  Commonly known as:  ZYRTEC  Take 10 mg by mouth daily.     citalopram 40 MG tablet  Commonly known as:  CELEXA  TAKE 1 TABLET (40 MG TOTAL) BY MOUTH DAILY.     estrogens (conjugated) 1.25 MG tablet  Commonly known as:  PREMARIN  TAKE 1 TABLET (1.25 MG TOTAL) BY MOUTH DAILY     HYDROcodone-acetaminophen 5-325 MG per tablet  Commonly known as:  NORCO/VICODIN  Take 1 tablet by mouth every 4 (four) hours as needed. For pain     lisinopril 5 MG tablet  Commonly known as:  PRINIVIL,ZESTRIL  Take 1 tablet (5 mg total) by mouth daily.     metoprolol succinate 100 MG 24 hr tablet  Commonly known as:  TOPROL-XL  Take 1 tablet (100 mg total) by mouth daily. Take with or immediately following a meal.     oxyCODONE-acetaminophen 5-325 MG per tablet  Commonly known as:  PERCOCET/ROXICET  Take 1-2 tablets by mouth every 4 (four) hours as needed for moderate pain.     phenylephrine 10 MG Tabs tablet  Commonly known as:  SUDAFED PE  Take 10 mg by mouth daily as needed (allergies).     verapamil 180 MG 24 hr capsule  Commonly known as:  VERELAN PM  TAKE 1 CAPSULE (180 MG TOTAL) BY MOUTH 2 (TWO) TIMES DAILY.  zolpidem 10 MG tablet  Commonly known as:  AMBIEN  TAKE 1 TABLET AT BEDTIME           Follow-up Information    Follow up with Adin Hector, MD.   Specialty:  General Surgery   Contact information:   [place] [address] [phone]        The results of significant diagnostics from this hospitalization (including imaging, microbiology, ancillary and laboratory) are listed below for reference.    Significant Diagnostic Studies: Chest 2 View  09/21/2014   CLINICAL DATA:  64 year old female preparing  for left total mastectomy on 09/23/2014. Preoperative chest x-ray.  EXAM: CHEST  2 VIEW  COMPARISON:  None.  FINDINGS: The lungs are clear and negative for focal airspace consolidation, pulmonary edema or suspicious pulmonary nodule. Mild central bronchitic changes which are similar compared to prior. No pleural effusion or pneumothorax. Cardiac and mediastinal contours are within normal limits. No acute fracture or lytic or blastic osseous lesions. The visualized upper abdominal bowel gas pattern is unremarkable. Hiatal hernia. Incompletely imaged posterior spine fusion hardware.  IMPRESSION: No active cardiopulmonary disease.  Hiatal hernia.   Electronically Signed   By: Jacqulynn Cadet M.D.   On: 09/21/2014 09:15   Ct Chest W Contrast  09/22/2014   CLINICAL DATA:  Newly diagnosed stage IIB breast cancer. Initial encounter.  EXAM: CT CHEST, ABDOMEN, AND PELVIS WITH CONTRAST  TECHNIQUE: Multidetector CT imaging of the chest, abdomen and pelvis was performed following the standard protocol during bolus administration of intravenous contrast.  CONTRAST:  177m OMNIPAQUE IOHEXOL 300 MG/ML  SOLN  COMPARISON:  Breast MRI 09/12/2014. Pelvic MRI 12/30/2006. Lumbar myelogram CT 05/23/2006.  FINDINGS: CT CHEST FINDINGS  Mediastinum/Nodes: There are no enlarged mediastinal, hilar or axillary lymph nodes. The thyroid gland and trachea demonstrate no significant findings. There is a moderate size hiatal hernia. The heart size is normal. There is no pericardial effusion.There are no significant vascular findings.  Lungs/Pleura: There is no pleural effusion.No suspicious pulmonary findings.  Musculoskeletal/Chest wall: Large left breast mass measures 6.0 x 4.2 cm transverse on image 31. There is no internal mammary adenopathy or chest wall lesion. No suspicious osseous findings.  CT ABDOMEN AND PELVIS FINDINGS  Hepatobiliary: 1.6 cm low-density lesion is noted posteriorly in the right hepatic lobe on image 59. This  demonstrates discontinuous enhancement superiorly and progressive enhancement on the delayed images, features suggestive of a hemangioma. No other liver lesions identified. No evidence of gallstones, gallbladder wall thickening or biliary dilatation.  Pancreas: Unremarkable. No pancreatic ductal dilatation or surrounding inflammatory changes.  Spleen: Normal in size without focal abnormality. Small splenule noted.  Adrenals/Urinary Tract: Both adrenal glands appear normal.The kidneys appear normal without evidence of urinary tract calculus or hydronephrosis. No bladder abnormalities are seen.  Stomach/Bowel: No evidence of bowel wall thickening, distention or surrounding inflammatory change.Mild sigmoid diverticulosis. Intraluminal small bowel lipoma measuring 1.6 cm noted on image 101.  Vascular/Lymphatic: Left periaortic nodule inferior to the renal hilum measuring 9 mm short axis on image 74 is unchanged from prior CT myelogram. No other enlarged abdominal pelvic lymph nodes identified. Mild aortoiliac atherosclerosis.  Reproductive: Status post hysterectomy. There is a complex solid posterior left adnexal mass which measures 5.8 x 3.7 x 3.4 cm. On MRI from 2008, this measured 5.2 x 3.6 x 4.2 cm. No evidence of right adnexal mass.  Other: Tiny umbilical hernia containing fat.  Musculoskeletal: No acute or significant osseous findings. Previous thoracolumbar  fusion with posterior spinal rods extending from T11 through L5.  IMPRESSION: 1. Known large left breast mass. No thoracic adenopathy or definite distant metastases identified. 2. Solitary hepatic lesion has features suggestive of a hemangioma. This could be confirmed with MRI if clinically warranted. 3. Complex left adnexal mass has only minimally enlarged from 2008, excluding an aggressive lesion. This could reflect an indolent left ovarian neoplasm. 4. Moderate size hiatal hernia.   Electronically Signed   By: Richardean Sale M.D.   On: 09/22/2014 10:09    Nm Bone Scan Whole Body  09/22/2014   CLINICAL DATA:  Foot pain for several years  EXAM: NUCLEAR MEDICINE WHOLE BODY BONE SCAN  TECHNIQUE: Whole body anterior and posterior images were obtained approximately 3 hours after intravenous injection of radiopharmaceutical.  RADIOPHARMACEUTICALS:  27.4 mCi Technetium-99 MDP  COMPARISON:  CT 09/22/2014  FINDINGS: Abnormal uptake within the lumbar spine a related to a posterior fusion. A focus of uptake at the right costovertebral junction of the ninth rib corresponds to degenerative change on comparison CT. Degenerate uptake noted within the knees. No focal radiotracer accumulation to suggest skeletal metastasis.  IMPRESSION: No scintigraphic evidence of skeletal metastasis.   Electronically Signed   By: Suzy Bouchard M.D.   On: 09/22/2014 13:48   Ct Abdomen Pelvis W Contrast  09/22/2014   CLINICAL DATA:  Newly diagnosed stage IIB breast cancer. Initial encounter.  EXAM: CT CHEST, ABDOMEN, AND PELVIS WITH CONTRAST  TECHNIQUE: Multidetector CT imaging of the chest, abdomen and pelvis was performed following the standard protocol during bolus administration of intravenous contrast.  CONTRAST:  181m OMNIPAQUE IOHEXOL 300 MG/ML  SOLN  COMPARISON:  Breast MRI 09/12/2014. Pelvic MRI 12/30/2006. Lumbar myelogram CT 05/23/2006.  FINDINGS: CT CHEST FINDINGS  Mediastinum/Nodes: There are no enlarged mediastinal, hilar or axillary lymph nodes. The thyroid gland and trachea demonstrate no significant findings. There is a moderate size hiatal hernia. The heart size is normal. There is no pericardial effusion.There are no significant vascular findings.  Lungs/Pleura: There is no pleural effusion.No suspicious pulmonary findings.  Musculoskeletal/Chest wall: Large left breast mass measures 6.0 x 4.2 cm transverse on image 31. There is no internal mammary adenopathy or chest wall lesion. No suspicious osseous findings.  CT ABDOMEN AND PELVIS FINDINGS  Hepatobiliary: 1.6 cm  low-density lesion is noted posteriorly in the right hepatic lobe on image 59. This demonstrates discontinuous enhancement superiorly and progressive enhancement on the delayed images, features suggestive of a hemangioma. No other liver lesions identified. No evidence of gallstones, gallbladder wall thickening or biliary dilatation.  Pancreas: Unremarkable. No pancreatic ductal dilatation or surrounding inflammatory changes.  Spleen: Normal in size without focal abnormality. Small splenule noted.  Adrenals/Urinary Tract: Both adrenal glands appear normal.The kidneys appear normal without evidence of urinary tract calculus or hydronephrosis. No bladder abnormalities are seen.  Stomach/Bowel: No evidence of bowel wall thickening, distention or surrounding inflammatory change.Mild sigmoid diverticulosis. Intraluminal small bowel lipoma measuring 1.6 cm noted on image 101.  Vascular/Lymphatic: Left periaortic nodule inferior to the renal hilum measuring 9 mm short axis on image 74 is unchanged from prior CT myelogram. No other enlarged abdominal pelvic lymph nodes identified. Mild aortoiliac atherosclerosis.  Reproductive: Status post hysterectomy. There is a complex solid posterior left adnexal mass which measures 5.8 x 3.7 x 3.4 cm. On MRI from 2008, this measured 5.2 x 3.6 x 4.2 cm. No evidence of right adnexal mass.  Other: Tiny umbilical hernia containing fat.  Musculoskeletal: No acute or significant  osseous findings. Previous thoracolumbar fusion with posterior spinal rods extending from T11 through L5.  IMPRESSION: 1. Known large left breast mass. No thoracic adenopathy or definite distant metastases identified. 2. Solitary hepatic lesion has features suggestive of a hemangioma. This could be confirmed with MRI if clinically warranted. 3. Complex left adnexal mass has only minimally enlarged from 2008, excluding an aggressive lesion. This could reflect an indolent left ovarian neoplasm. 4. Moderate size hiatal  hernia.   Electronically Signed   By: Richardean Sale M.D.   On: 09/22/2014 10:09   Mr Breast Bilateral W Wo Contrast  09/12/2014   CLINICAL DATA:  Recently diagnosed left breast invasive mammary carcinoma. Preoperative evaluation.  EXAM: BILATERAL BREAST MRI WITH AND WITHOUT CONTRAST  TECHNIQUE: Multiplanar, multisequence MR images of both breasts were obtained prior to and following the intravenous administration of 14 ml of MultiHance  THREE-DIMENSIONAL MR IMAGE RENDERING ON INDEPENDENT WORKSTATION:  Three-dimensional MR images were rendered by post-processing of the original MR data on an independent workstation. The three-dimensional MR images were interpreted, and findings are reported in the following complete MRI report for this study. Three dimensional images were evaluated at the independent DynaCad workstation  COMPARISON:  Mammograms dated 09/06/2014, 08/23/2014, 12/19/2009.  FINDINGS: Breast composition: c.  Heterogeneous fibroglandular tissue.  Background parenchymal enhancement: Moderate.  Right breast: Multinodular background parenchymal enhancement is present. There is no worrisome enhancing lesions.  Left breast: There is an irregularly marginated enhancing mass located within the left breast at the 2:30 o'clock position corresponding to the recently diagnosed invasive mammary carcinoma. There is marked peripheral irregular rim type enhancement with probable central necrosis. The mass measures 6.5 x 5.3 x 5.1 cm in size and is associated with a mixture of plateau and washout enhancement kinetics. In addition, there are 2 worrisome satellite enhancing masses located along the anterior superior aspect of this larger mass measuring 8 x 6 x 7 and 8 x 6 x 5 mm in size. Each of these is located approximately 5 mm anterior and superior to the large mass. These are separated by 7 mm. These are associated with a mixture of plateau and washout enhancement kinetics. There are no additional worrisome  enhancing foci within left breast.  Lymph nodes: No abnormal appearing lymph nodes.  Ancillary findings: There is a moderately large hiatal hernia present.  IMPRESSION: 6.5 cm irregular enhancing mass located within the lateral left breast extending into the upper-outer quadrant corresponding to the recently diagnosed invasive mammary carcinoma. There are 2 adjacent 8 mm satellite masses located along the anterior superior aspect of this large mass. These are located approximately 5 mm anterior and superior to the mass. No evidence for adenopathy.  Large hiatal hernia.  RECOMMENDATION: Treatment plan.  BI-RADS CATEGORY  6: Known biopsy-proven malignancy.   Electronically Signed   By: Luberta Robertson M.D.   On: 09/12/2014 13:46   Nm Sentinel Node Inj-no Rpt (breast)  09/23/2014   CLINICAL DATA: Cancer left breast   Sulfur colloid was injected intradermally by the nuclear medicine  technologist for breast cancer sentinel node localization.    Dg Chest Port 1 View  09/23/2014   CLINICAL DATA:  Port-A-Cath placement.  EXAM: PORTABLE CHEST - 1 VIEW  COMPARISON:  09/20/2014  FINDINGS: Right subclavian Port-A-Cath has been placed. Catheter tip in the lower SVC region. Negative for a pneumothorax. This is a CT injectable Port-A-Cath. Heart and mediastinum are within normal limits. Spinal rods in the lower thoracic spine and lumbar spine. Retrocardiac  density is suggestive for a hiatal hernia. Linear density in the right mid lung is compatible with atelectasis.  IMPRESSION: Port-A-Cath tip in the lower SVC region.  Negative for pneumothorax.   Electronically Signed   By: Markus Daft M.D.   On: 09/23/2014 16:36   Mm Digital Diagnostic Unilat L  09/06/2014   CLINICAL DATA:  64 year old female status post ultrasound-guided left breast biopsy  EXAM: DIAGNOSTIC LEFT MAMMOGRAM POST ULTRASOUND BIOPSY  COMPARISON:  Previous exams  FINDINGS: Mammographic images were obtained following ultrasound guided biopsy of a left breast  mass at 3 o'clock. Post biopsy images demonstrate satisfactory placement of a ribbon clip within the mass.  IMPRESSION: Satisfactory clip placement post ultrasound-guided left breast biopsy.  Final Assessment: Post Procedure Mammograms for Marker Placement   Electronically Signed   By: Pamelia Hoit M.D.   On: 09/06/2014 13:27   Mm Digital Diagnostic Unilat L  08/30/2014   CLINICAL DATA:  The patient returns after screening for evaluation of left breast mass.  EXAM: DIGITAL DIAGNOSTIC  LEFT MAMMOGRAM  ULTRASOUND LEFT BREAST  COMPARISON:  08/23/2014 and and 12/19/2009  ACR Breast Density Category c: The breast tissue is heterogeneously dense, which may obscure small masses.  FINDINGS: Spot compression views confirm presence of an oval mass within the lateral midportion of the left breast. Mammographically mass measures 5.7 x 4.6 x 5.6 cm. Some margins are circumscribed. Others are spiculated or obscured.  On physical exam, there is a visible mass in the lateral retroareolar region of the left breast. On physical exam this is mobile and nontender. There is no associated erythema or skin change. I palpate no abnormality in the left axilla.  Ultrasound is performed, showing a solid heterogeneous mass centered in the 3 o'clock location of the left breast 2 cm from the nipple and extending into the retroareolar region. Mass is greater than 5 cm in diameter and contains internal blood flow on Doppler evaluation. Evaluation of the left axilla is negative for adenopathy.  IMPRESSION: Solid mass in the lateral portion of the left breast suspicious for malignancy. Other considerations include fibroadenoma or phyllodes. Tissue diagnosis is recommended.  RECOMMENDATION: Ultrasound-guided core biopsy is recommended and scheduled for the patient on 09/06/2014 at 12:30 p.m.  I have discussed the findings and recommendations with the patient. Results were also provided in writing at the conclusion of the visit. If applicable, a  reminder letter will be sent to the patient regarding the next appointment.  BI-RADS CATEGORY  4: Suspicious.   Electronically Signed   By: Shon Hale M.D.   On: 08/30/2014 16:35   Dg Fluoro Guide Cv Line-no Report  09/23/2014   CLINICAL DATA:    FLOURO GUIDE CV LINE  Fluoroscopy was utilized by the requesting physician.  No radiographic  interpretation.    US Breast Ltd Uni Left Inc Axilla  08/30/2014   CLINICAL DATA:  The patient returns after screening for evaluation of left breast mass.  EXAM: DIGITAL DIAGNOSTIC  LEFT MAMMOGRAM  ULTRASOUND LEFT BREAST  COMPARISON:  08/23/2014 and and 12/19/2009  ACR Breast Density Category c: The breast tissue is heterogeneously dense, which may obscure small masses.  FINDINGS: Spot compression views confirm presence of an oval mass within the lateral midportion of the left breast. Mammographically mass measures 5.7 x 4.6 x 5.6 cm. Some margins are circumscribed. Others are spiculated or obscured.  On physical exam, there is a visible mass in the lateral retroareolar region of the left breast. On physical  exam this is mobile and nontender. There is no associated erythema or skin change. I palpate no abnormality in the left axilla.  Ultrasound is performed, showing a solid heterogeneous mass centered in the 3 o'clock location of the left breast 2 cm from the nipple and extending into the retroareolar region. Mass is greater than 5 cm in diameter and contains internal blood flow on Doppler evaluation. Evaluation of the left axilla is negative for adenopathy.  IMPRESSION: Solid mass in the lateral portion of the left breast suspicious for malignancy. Other considerations include fibroadenoma or phyllodes. Tissue diagnosis is recommended.  RECOMMENDATION: Ultrasound-guided core biopsy is recommended and scheduled for the patient on 09/06/2014 at 12:30 p.m.  I have discussed the findings and recommendations with the patient. Results were also provided in writing at the  conclusion of the visit. If applicable, a reminder letter will be sent to the patient regarding the next appointment.  BI-RADS CATEGORY  4: Suspicious.   Electronically Signed   By: Shon Hale M.D.   On: 08/30/2014 16:35   [place] Lt Breast Bx W Loc Dev 1st Lesion Img Bx Spec US Guide  09/07/2014   ADDENDUM REPORT: 09/07/2014 12:21  ADDENDUM: I spoke with the patient today by phone at 12:20 p.m. Patient states she is experiencing mild soreness and pain at the biopsy site for which she is taking Tylenol. Patient was given the results of her left breast biopsy which was compatible with invasive ductal carcinoma grade 2. The pathology results are concordant with the imaging findings. Patient's immediate questions and concerns were addressed. Patient was given an appointment for breast MRI 09/13/2014 at 10:15 a.m. Patient was also given an appointment to the [facility] 09/14/2014 and 8 a.m.   Electronically Signed   By: Marin Olp M.D.   On: 09/07/2014 12:21   09/07/2014   CLINICAL DATA:  64 year old female for biopsy of a suspicious left breast mass  EXAM: ULTRASOUND GUIDED LEFT BREAST CORE NEEDLE BIOPSY  COMPARISON:  Previous exams.  PROCEDURE: I met with the patient and we discussed the procedure of ultrasound-guided biopsy, including benefits and alternatives. We discussed the high likelihood of a successful procedure. We discussed the risks of the procedure including infection, bleeding, tissue injury, clip migration, and inadequate sampling. Informed written consent was given. The usual time-out protocol was performed immediately prior to the procedure.  Using sterile technique and 2% Lidocaine as local anesthetic, under direct ultrasound visualization, a 12 gauge vacuum-assisteddevice was used to perform biopsy of a suspicious left breast mass at 3 o'clock using a lateral to medial approach. At the conclusion of the procedure, a ribbon tissue marker clip was deployed into the biopsy cavity. Follow-up 2-view mammogram  was performed and dictated separately.  IMPRESSION: Ultrasound-guided biopsy of a suspicious left breast mass at 3 o'clock. No apparent complications.  Electronically Signed: By: Pamelia Hoit M.D. On: 09/06/2014 13:26    Microbiology: No results found for this or any previous visit (from the past 240 hour(s)).   Labs: Basic Metabolic Panel:  Recent Labs Lab 09/20/14 1553 09/23/14 1920  NA 137  --   K 3.7  --   CL 104  --   CO2 21  --   GLUCOSE 111*  --   BUN 8  --   CREATININE 0.57 0.68  CALCIUM 9.1  --    Liver Function Tests:  Recent Labs Lab 09/20/14 1553  AST 18  ALT 11  ALKPHOS 34*  BILITOT 0.2*  PROT 6.1  ALBUMIN 3.5   No results for input(s): LIPASE, AMYLASE in the last 168 hours. No results for input(s): AMMONIA in the last 168 hours. CBC:  Recent Labs Lab 09/20/14 1553 09/23/14 1920  WBC 6.9 10.5  NEUTROABS 3.1  --   HGB 12.1 11.9*  HCT 36.7 36.2  MCV 87.0 86.4  PLT 505* 451*   Cardiac Enzymes: No results for input(s): CKTOTAL, CKMB, CKMBINDEX, TROPONINI in the last 168 hours. BNP: BNP (last 3 results) No results for input(s): BNP in the last 8760 hours.  ProBNP (last 3 results) No results for input(s): PROBNP in the last 8760 hours.  CBG: No results for input(s): GLUCAP in the last 168 hours.  Principal Problem:   Breast cancer of upper-outer quadrant of left female breast Active Problems:   HTN (hypertension)   Facial palsy   Cancer of left breast  Signed:  Dequita Schleicher, ANP-BC

## 2014-09-24 NOTE — Discharge Instructions (Signed)
[facility] [place] °[phone] ° °MASTECTOMY: POST OP INSTRUCTIONS ° °Always review your discharge instruction sheet given to you by the facility where your surgery was performed. °IF YOU HAVE DISABILITY OR FAMILY LEAVE FORMS, YOU MUST BRING THEM TO THE OFFICE FOR PROCESSING.   °DO NOT GIVE THEM TO YOUR DOCTOR. °A prescription for pain medication may be given to you upon discharge.  Take your pain medication as prescribed, if needed.  If narcotic pain medicine is not needed, then you may take acetaminophen (Tylenol) or ibuprofen (Advil) as needed. °1. Take your usually prescribed medications unless otherwise directed. °2. If you need a refill on your pain medication, please contact your pharmacy.  They will contact our office to request authorization.  Prescriptions will not be filled after 5pm or on week-ends. °3. You should follow a light diet the first few days after arrival home, such as soup and crackers, etc.  Resume your normal diet the day after surgery. °4. Most patients will experience some swelling and bruising on the chest and underarm.  Ice packs will help.  Swelling and bruising can take several days to resolve.  °5. It is common to experience some constipation if taking pain medication after surgery.  Increasing fluid intake and taking a stool softener (such as Colace) will usually help or prevent this problem from occurring.  A mild laxative (Milk of Magnesia or Miralax) should be taken according to package instructions if there are no bowel movements after 48 hours. °6. Unless discharge instructions indicate otherwise, leave your bandage dry and in place until your next appointment in 3-5 days.  You may take a limited sponge bath.  No tube baths or showers until the drains are removed.  You may have steri-strips (small skin tapes) in place directly over the incision.  These strips should be left on the skin for 7-10 days.  If your surgeon used skin glue on the incision, you may  shower in 24 hours.  The glue will flake off over the next 2-3 weeks.  Any sutures or staples will be removed at the office during your follow-up visit. °7. DRAINS:  If you have drains in place, it is important to keep a list of the amount of drainage produced each day in your drains.  Before leaving the hospital, you should be instructed on drain care.  Call our office if you have any questions about your drains. °8. ACTIVITIES:  You may resume regular (light) daily activities beginning the next day--such as daily self-care, walking, climbing stairs--gradually increasing activities as tolerated.  You may have sexual intercourse when it is comfortable.  Refrain from any heavy lifting or straining until approved by your doctor. °a. You may drive when you are no longer taking prescription pain medication, you can comfortably wear a seatbelt, and you can safely maneuver your car and apply brakes. °b. RETURN TO WORK:  __________________________________________________________ °9. You should see your doctor in the office for a follow-up appointment approximately 3-5 days after your surgery.  Your doctor’s nurse will typically make your follow-up appointment when she calls you with your pathology report.  Expect your pathology report 2-3 business days after your surgery.  You may call to check if you do not hear from us after three days.   °10. OTHER INSTRUCTIONS: ______________________________________________________________________________________________ ____________________________________________________________________________________________ °WHEN TO CALL YOUR DOCTOR: °1. Fever over 101.0 °2. Nausea and/or vomiting °3. Extreme swelling or bruising °4. Continued bleeding from incision. °5. Increased pain, redness, or drainage from the incision. °  The clinic staff is available to answer your questions during regular business hours.  Please don’t hesitate to call and ask to speak to one of the nurses for clinical  concerns.  If you have a medical emergency, go to the nearest emergency room or call 911.  A surgeon from [facility] is always on call at the hospital. °[address], [address], [place], [place]  [address] ? [address] [address], [place], [place]   [address] °[phone] ? [phone] ? FAX [phone] °Web site: [email or web address] °

## 2014-09-24 NOTE — Progress Notes (Signed)
Patient ID: Amy [name], female   DOB: 1951/04/16, 64 y.o.   MRN: [id number]     [facility]      [address] [place], [place] [address]    Phone: [phone] FAX: [phone]     Subjective: VSS.  Afebrile.  Has not had PO pain meds.  Sore.  Tolerating PO.  Ambulating.    Objective:  Vital signs:  Filed Vitals:   09/23/14 1836 09/23/14 2155 09/24/14 0200 09/24/14 0539  BP: 147/91 126/85 124/85 133/87  Pulse: 85 104 100 96  Temp: 98.4 F (36.9 C) 98.6 F (37 C) 98.2 F (36.8 C) 97.7 F (36.5 C)  TempSrc: Oral Oral Oral Oral  Resp:  _0 Height: _1  (1.676 m)     Weight: 173 lb (78.472 kg)     SpO2: 93% 95% 95% 95%    Last BM Date: 09/22/14  Intake/Output   Yesterday:  02/12 0701 - 02/13 0700 In: 2594.2 [P.O.:460; I.V.:2084.2; IV Piggyback:50] Out: 217 [Drains:142; Blood:75] This shift:  Total I/O In: 520 [P.O.:520] Out: -   Physical Exam: General: Pt awake/alert/oriented x4 in no acute distress Chest: cta.  No chest wall pain w good excursion CV:  Pulses intact.  Regular rhythm MS: Normal AROM mjr joints.  No obvious deformity Abdomen: Soft.  Nondistended.  nontender.  No evidence of peritonitis.  No incarcerated hernias. Ext:  SCDs BLE.  No mjr edema.  No cyanosis Skin: left breast-incision is clean, dermabond in place, 2 drains with serosanguinous output.    Problem List:   Principal Problem:   Breast cancer of upper-outer quadrant of left female breast Active Problems:   HTN (hypertension)   Facial palsy   Cancer of left breast    Results:   Labs: Results for orders placed or performed during the hospital encounter of 09/23/14 (from the past 48 hour(s))  CBC     Status: Abnormal   Collection Time: 09/23/14  7:20 PM  Result Value Ref Range   WBC 10.5 4.0 - 10.5 K/uL   RBC 4.19 3.87 - 5.11 MIL/uL   Hemoglobin 11.9 (L) 12.0 - 15.0 g/dL   HCT 36.2 36.0 - 46.0 %   MCV 86.4 78.0 -  100.0 fL   MCH 28.4 26.0 - 34.0 pg   MCHC 32.9 30.0 - 36.0 g/dL   RDW 13.0 11.5 - 15.5 %   Platelets 451 (H) 150 - 400 K/uL  Creatinine, serum     Status: None   Collection Time: 09/23/14  7:20 PM  Result Value Ref Range   Creatinine, Ser 0.68 0.50 - 1.10 mg/dL   GFR calc non Af Amer >90 >90 mL/min   GFR calc Af Amer >90 >90 mL/min    Comment: (NOTE) The eGFR has been calculated using the CKD EPI equation. This calculation has not been validated in all clinical situations. eGFR's persistently <90 mL/min signify possible Chronic Kidney Disease.     Imaging / Studies: Nm Bone Scan Whole Body  09/22/2014   CLINICAL DATA:  Foot pain for several years  EXAM: NUCLEAR MEDICINE WHOLE BODY BONE SCAN  TECHNIQUE: Whole body anterior and posterior images were obtained approximately 3 hours after intravenous injection of radiopharmaceutical.  RADIOPHARMACEUTICALS:  27.4 mCi Technetium-99 MDP  COMPARISON:  CT 09/22/2014  FINDINGS: Abnormal uptake within the lumbar spine a related to a posterior fusion. A focus of uptake at the right costovertebral junction of the ninth rib corresponds  to degenerative change on comparison CT. Degenerate uptake noted within the knees. No focal radiotracer accumulation to suggest skeletal metastasis.  IMPRESSION: No scintigraphic evidence of skeletal metastasis.   Electronically Signed   By: Suzy Bouchard M.D.   On: 09/22/2014 13:48   Nm Sentinel Node Inj-no Rpt (breast)  09/23/2014   CLINICAL DATA: Cancer left breast   Sulfur colloid was injected intradermally by the nuclear medicine  technologist for breast cancer sentinel node localization.    Dg Chest Port 1 View  09/23/2014   CLINICAL DATA:  Port-A-Cath placement.  EXAM: PORTABLE CHEST - 1 VIEW  COMPARISON:  09/20/2014  FINDINGS: Right subclavian Port-A-Cath has been placed. Catheter tip in the lower SVC region. Negative for a pneumothorax. This is a CT injectable Port-A-Cath. Heart and mediastinum are within  normal limits. Spinal rods in the lower thoracic spine and lumbar spine. Retrocardiac density is suggestive for a hiatal hernia. Linear density in the right mid lung is compatible with atelectasis.  IMPRESSION: Port-A-Cath tip in the lower SVC region.  Negative for pneumothorax.   Electronically Signed   By: Markus Daft M.D.   On: 09/23/2014 16:36   Dg Fluoro Guide Cv Line-no Report  09/23/2014   CLINICAL DATA:    FLOURO GUIDE CV LINE  Fluoroscopy was utilized by the requesting physician.  No radiographic  interpretation.     Medications / Allergies:  Scheduled Meds: .  ceFAZolin (ANCEF) IV  2 g Intravenous 3 times per day  . citalopram  40 mg Oral Daily  . enoxaparin (LOVENOX) injection  40 mg Subcutaneous Q24H  . lisinopril  5 mg Oral Daily  . loratadine  10 mg Oral Daily  . metoprolol succinate  100 mg Oral Daily  . verapamil  180 mg Oral Daily  . zolpidem  5 mg Oral QHS   Continuous Infusions:  PRN Meds:.ALPRAZolam, HYDROcodone-acetaminophen, HYDROmorphone (DILAUDID) injection, ondansetron **OR** ondansetron (ZOFRAN) IV, oxyCODONE-acetaminophen  Antibiotics: Anti-infectives    Start     Dose/Rate Route Frequency Ordered Stop   09/23/14 2200  ceFAZolin (ANCEF) IVPB 2 g/50 mL premix     2 g 100 mL/hr over 30 Minutes Intravenous 3 times per day 09/23/14 1827 09/24/14 2159   09/23/14 0600  ceFAZolin (ANCEF) IVPB 2 g/50 mL premix     2 g 100 mL/hr over 30 Minutes Intravenous On call to O.R. 09/22/14 1338 09/23/14 1312        Assessment/Plan Amy [name]  64 y.o. female  1 Day Post-Op  Procedure(s): LEFT TOTAL MASTECTOMY WITH LEFT AXILLARY SENTINEL LYMPH NODE BIOPSY INSERTION PORT-A-CATH RIGHT SUBCLAVIAN  -will discharge home if able to tolerate PO pain meds and pain is controlled -drain care teaching  Erby Pian, [facility] Pager [phone]( For consults and floor pages call [phone]  09/24/2014 9:42 AM

## 2014-09-26 ENCOUNTER — Encounter (HOSPITAL_COMMUNITY): Payer: Self-pay | Admitting: General Surgery

## 2014-09-27 ENCOUNTER — Other Ambulatory Visit: Payer: Self-pay

## 2014-09-27 ENCOUNTER — Other Ambulatory Visit: Payer: Self-pay | Admitting: Internal Medicine

## 2014-09-27 MED ORDER — ZOLPIDEM TARTRATE 10 MG PO TABS: 10.0000 mg | ORAL_TABLET | Freq: Every day | ORAL | Status: DC

## 2014-09-27 NOTE — Telephone Encounter (Signed)
RX called in Pt notified

## 2014-09-27 NOTE — Telephone Encounter (Signed)
Pt called to enquire about this medication

## 2014-09-27 NOTE — Progress Notes (Signed)
Quick Note:  Inform patient of Pathology report,. Had mastectomy, nodes and port last week. Tell her tumor completely removed with negative margin. All nodes negative, which is good news. I will discuss in detail at first post op visit.  hmi ______

## 2014-10-05 ENCOUNTER — Telehealth: Payer: Self-pay | Admitting: Physician Assistant

## 2014-10-05 NOTE — Telephone Encounter (Signed)
Message to [place]: Patient states that after her mastectomy she is cancer free and she will be meeting the team of doctors to discuss some other things.   [phone]

## 2014-10-06 ENCOUNTER — Encounter: Payer: Self-pay | Admitting: Physician Assistant

## 2014-10-11 ENCOUNTER — Ambulatory Visit (HOSPITAL_BASED_OUTPATIENT_CLINIC_OR_DEPARTMENT_OTHER): Payer: BLUE CROSS/BLUE SHIELD | Admitting: Hematology and Oncology

## 2014-10-11 ENCOUNTER — Telehealth: Payer: Self-pay | Admitting: Hematology and Oncology

## 2014-10-11 ENCOUNTER — Telehealth: Payer: Self-pay | Admitting: *Deleted

## 2014-10-11 ENCOUNTER — Encounter: Payer: Self-pay | Admitting: *Deleted

## 2014-10-11 VITALS — BP 135/77 | HR 72 | Temp 98.4°F | Resp 18 | Ht 66.0 in | Wt 156.2 lb

## 2014-10-11 DIAGNOSIS — C50412 Malignant neoplasm of upper-outer quadrant of left female breast: Secondary | ICD-10-CM

## 2014-10-11 DIAGNOSIS — Z170 Estrogen receptor positive status [ER+]: Secondary | ICD-10-CM

## 2014-10-11 MED ORDER — LIDOCAINE-PRILOCAINE 2.5-2.5 % EX CREA: TOPICAL_CREAM | CUTANEOUS | Status: DC

## 2014-10-11 MED ORDER — ONDANSETRON HCL 8 MG PO TABS: 8.0000 mg | ORAL_TABLET | Freq: Two times a day (BID) | ORAL | Status: DC | PRN

## 2014-10-11 MED ORDER — DEXAMETHASONE 4 MG PO TABS: ORAL_TABLET | ORAL | Status: DC

## 2014-10-11 MED ORDER — LORAZEPAM 0.5 MG PO TABS: 0.5000 mg | ORAL_TABLET | Freq: Four times a day (QID) | ORAL | Status: DC | PRN

## 2014-10-11 MED ORDER — PROCHLORPERAZINE MALEATE 10 MG PO TABS: 10.0000 mg | ORAL_TABLET | Freq: Four times a day (QID) | ORAL | Status: DC | PRN

## 2014-10-11 NOTE — Progress Notes (Signed)
Patient Care Team: Leandrew Koyanagi, MD as PCP - General (Family Medicine) Fanny Skates, MD as Consulting Physician (General Surgery) Rulon Eisenmenger, MD as Consulting Physician (Hematology and Oncology) Rexene Edison, MD as Consulting Physician (Radiation Oncology) [facility], RN as Registered Nurse Roselee Culver, RN as Registered Nurse Trinda Pascal, NP as Nurse Practitioner (Nurse Practitioner)  DIAGNOSIS: Breast cancer of upper-outer quadrant of left female breast   Staging form: Breast, AJCC 7th Edition     Clinical stage from 09/14/2014: Stage IIB (T3, N0, M0) - Unsigned     Pathologic stage from 09/26/2014: Stage IIB (T3, N0, cM0) - Signed by Seward Grater, MD on 10/04/2014       Staging comments: Staged on final mastectomy by Dr. Avis Epley.    SUMMARY OF ONCOLOGIC HISTORY:   Breast cancer of upper-outer quadrant of left female breast   09/06/2014 Initial Diagnosis Left breast 3:00 biopsy: Invasive ductal carcinoma with abundant extracellular mucin, ER 97%, PR 98%, Ki-67 47%, HER-2 negative ratio 1.28   09/12/2014 Breast MRI Left breast: 6.5 x 5.3 x 5.1 cm irregular enhancing mass with 2 adjacent 8 mm satellite masses, no lymphadenopathy   09/23/2014 Surgery Left Mastectomy: IDC grade 2 with extracellular mucin 6.2 cm, 4 LN Neg, ER 97%, PR 98%, Her 2 Neg ratio 1.28, Ki 67 47% T3N0 Stage 2B    CHIEF COMPLIANT: Follow-up after surgery  INTERVAL HISTORY: Amy [name] is a 64 year old lady with above-mentioned history of left breast cancer treated with mastectomy and is here today to discuss adjuvant chemotherapy. She had done well from surgery and all the tubes have been removed. Port has been placed and she is healing from it.  REVIEW OF SYSTEMS:   Constitutional: Denies fevers, chills or abnormal weight loss Eyes: Denies blurriness of vision Ears, nose, mouth, throat, and face: Denies mucositis or sore throat Respiratory: Denies cough, dyspnea or  wheezes Cardiovascular: Denies palpitation, chest discomfort or lower extremity swelling Gastrointestinal:  Denies nausea, heartburn or change in bowel habits Skin: Denies abnormal skin rashes Lymphatics: Denies new lymphadenopathy or easy bruising Neurological:Denies numbness, tingling or new weaknesses Behavioral/Psych: Mood is stable, no new changes  Breast: Slight soreness in the breasts All other systems were reviewed with the patient and are negative.  I have reviewed the past medical history, past surgical history, social history and family history with the patient and they are unchanged from previous note.  ALLERGIES:  is allergic to codeine; darvocet; gold-containing drug products; tramadol; and sulfa antibiotics.  MEDICATIONS:  Current Outpatient Prescriptions  Medication Sig Dispense Refill  . ALPRAZolam (XANAX) 0.25 MG tablet TAKE 1 TAB TWICE A DAY AS NEEDED 60 tablet 0  . cetirizine (ZYRTEC) 10 MG tablet Take 10 mg by mouth daily.    . citalopram (CELEXA) 40 MG tablet TAKE 1 TABLET (40 MG TOTAL) BY MOUTH DAILY. 90 tablet 3  . HYDROcodone-acetaminophen (NORCO) 5-325 MG per tablet Take 1 tablet by mouth every 4 (four) hours as needed. For pain    . lisinopril (PRINIVIL,ZESTRIL) 5 MG tablet Take 1 tablet (5 mg total) by mouth daily. 90 tablet 3  . metoprolol succinate (TOPROL-XL) 100 MG 24 hr tablet Take 1 tablet (100 mg total) by mouth daily. Take with or immediately following a meal. 90 tablet 3  . ondansetron (ZOFRAN) 4 MG tablet Take 1 tablet (4 mg total) by mouth every 8 (eight) hours as needed for nausea. 20 tablet 0  . phenylephrine (SUDAFED PE) 10  MG TABS tablet Take 10 mg by mouth daily as needed (allergies).    . verapamil (VERELAN PM) 180 MG 24 hr capsule TAKE 1 CAPSULE (180 MG TOTAL) BY MOUTH 2 (TWO) TIMES DAILY. 180 capsule 3  . zolpidem (AMBIEN) 10 MG tablet Take 1 tablet (10 mg total) by mouth at bedtime. 30 tablet 1  . dexamethasone (DECADRON) 4 MG tablet Take 2  tablets by mouth once a day on the day after chemotherapy and then take 2 tablets two times a day for 2 days. Take with food. 30 tablet 1  . lidocaine-prilocaine (EMLA) cream Apply to affected area once 30 g 3  . LORazepam (ATIVAN) 0.5 MG tablet Take 1 tablet (0.5 mg total) by mouth every 6 (six) hours as needed (Nausea or vomiting). 30 tablet 0  . ondansetron (ZOFRAN) 8 MG tablet Take 1 tablet (8 mg total) by mouth 2 (two) times daily as needed. Start on the third day after chemotherapy. 30 tablet 1  . prochlorperazine (COMPAZINE) 10 MG tablet Take 1 tablet (10 mg total) by mouth every 6 (six) hours as needed (Nausea or vomiting). 30 tablet 1   No current facility-administered medications for this visit.    PHYSICAL EXAMINATION: ECOG PERFORMANCE STATUS: 1 - Symptomatic but completely ambulatory  Filed Vitals:   10/11/14 0827  BP: 135/77  Pulse: 72  Temp: 98.4 F (36.9 C)  Resp: 18   Filed Weights   10/11/14 0827  Weight: 156 lb 3.2 oz (70.852 kg)    GENERAL:alert, no distress and comfortable SKIN: skin color, texture, turgor are normal, no rashes or significant lesions EYES: normal, Conjunctiva are pink and non-injected, sclera clear OROPHARYNX:no exudate, no erythema and lips, buccal mucosa, and tongue normal  NECK: supple, thyroid normal size, non-tender, without nodularity LYMPH:  no palpable lymphadenopathy in the cervical, axillary or inguinal LUNGS: clear to auscultation and percussion with normal breathing effort HEART: regular rate & rhythm and no murmurs and no lower extremity edema ABDOMEN:abdomen soft, non-tender and normal bowel sounds Musculoskeletal:no cyanosis of digits and no clubbing  NEURO: alert & oriented x 3 with fluent speech, no focal motor/sensory deficits  LABORATORY DATA:  I have reviewed the data as listed   Chemistry      Component Value Date/Time   NA 137 09/20/2014 1553   NA 138 09/14/2014 0839   K 3.7 09/20/2014 1553   K 3.9 09/14/2014 0839    CL 104 09/20/2014 1553   CO2 21 09/20/2014 1553   CO2 24 09/14/2014 0839   BUN 8 09/20/2014 1553   BUN 7.2 09/14/2014 0839   CREATININE 0.68 09/23/2014 1920   CREATININE 0.7 09/14/2014 0839   CREATININE 0.56 08/16/2014 1208      Component Value Date/Time   CALCIUM 9.1 09/20/2014 1553   CALCIUM 8.4 09/14/2014 0839   ALKPHOS 34* 09/20/2014 1553   ALKPHOS 36* 09/14/2014 0839   AST 18 09/20/2014 1553   AST 13 09/14/2014 0839   ALT 11 09/20/2014 1553   ALT 10 09/14/2014 0839   BILITOT 0.2* 09/20/2014 1553   BILITOT 0.26 09/14/2014 0839       Lab Results  Component Value Date   WBC 10.5 09/23/2014   HGB 11.9* 09/23/2014   HCT 36.2 09/23/2014   MCV 86.4 09/23/2014   PLT 451* 09/23/2014   NEUTROABS 3.1 09/20/2014   ASSESSMENT & PLAN:  Breast cancer of upper-outer quadrant of left female breast Left Mastectomy 09/23/14: IDC grade 2 with extracellular mucin 6.2 cm, 4  LN Neg, ER 97%, PR 98%, Her 2 Neg ratio 1.28, Ki 67 47% T3N0 Stage 2B  Pathology review: I discussed the pathology report in great detail including the final staging as well as the treatment plan.  Recommendation: Adjuvant chemotherapy with dose dense Adriamycin and Cytoxan 4 followed by Abraxane 12 weekly followed by radiation and anti-estrogen therapy.  Chemotherapy counseling:I have discussed the risks and benefits of chemotherapy including the risks of nausea/ vomiting, risk of infection from low WBC count, fatigue due to chemo or anemia, bruising or bleeding due to low platelets, mouth sores, loss/ change in taste and decreased appetite. Liver and kidney function will be monitored through out chemotherapy as abnormalities in liver and kidney function may be a side effect of treatment. Cardiac dysfunction due to Adriamycinwas discussed in detail. Risk of permanent bone marrow dysfunction and leukemia due to chemo were also discussed.  Plan: 1. Port has been placed during surgery 2. Echocardiogram 09/22/2014  showed EF 50-55% 3. Chemotherapy education will be provided 4. Start chemotherapy in approximately 2 weeks 5. Antiemetic regimen was also discussed with her in great detail 6. Offered participation in prevent clinical trial     Orders Placed This Encounter  Procedures  . CBC with Differential    Standing Status: Standing     Number of Occurrences: 20     Standing Expiration Date: 10/12/2015  . Comprehensive metabolic panel    Standing Status: Standing     Number of Occurrences: 20     Standing Expiration Date: 10/12/2015  . CBC with Differential    Standing Status: Future     Number of Occurrences:      Standing Expiration Date: 10/11/2015  . Comprehensive metabolic panel (Cmet) - [facility]    Standing Status: Future     Number of Occurrences:      Standing Expiration Date: 10/11/2015  . PHYSICIAN COMMUNICATION ORDER    A baseline Echo/ Muga should be obtained prior to initiation of Anthracycline Chemotherapy   The patient has a good understanding of the overall plan. she agrees with it. She will call with any problems that may develop before her next visit here.   Rulon Eisenmenger, MD

## 2014-10-11 NOTE — Telephone Encounter (Signed)
Per staff message and POF I have scheduled appts. Advised scheduler of appts. JMW  

## 2014-10-11 NOTE — Telephone Encounter (Signed)
Pt confirmed labs/ov per 03/01 POF, gave pt AVS... KJ, sent msg to add chemo °

## 2014-10-11 NOTE — Telephone Encounter (Signed)
Called pt to confirm labs/chemo for 03/10 and for pt to p/u schedule when checking in with Radiation... KJ

## 2014-10-11 NOTE — Assessment & Plan Note (Signed)
Left Mastectomy 09/23/14: IDC grade 2 with extracellular mucin 6.2 cm, 4 LN Neg, ER 97%, PR 98%, Her 2 Neg ratio 1.28, Ki 67 47% T3N0 Stage 2B  Pathology review: I discussed the pathology report in great detail including the final staging as well as the treatment plan.  Recommendation: Adjuvant chemotherapy with dose dense Adriamycin and Cytoxan 4 followed by Abraxane 12 weekly followed by radiation and anti-estrogen therapy.  Chemotherapy counseling:I have discussed the risks and benefits of chemotherapy including the risks of nausea/ vomiting, risk of infection from low WBC count, fatigue due to chemo or anemia, bruising or bleeding due to low platelets, mouth sores, loss/ change in taste and decreased appetite. Liver and kidney function will be monitored through out chemotherapy as abnormalities in liver and kidney function may be a side effect of treatment. Cardiac dysfunction due to Adriamycinwas discussed in detail. Risk of permanent bone marrow dysfunction and leukemia due to chemo were also discussed.  Plan: 1. Port has been placed during surgery 2. Echocardiogram 09/22/2014 showed EF 50-55% 3. Chemotherapy education will be provided 4. Start chemotherapy in approximately 2 weeks 5. Antiemetic regimen was also discussed with her in great detail

## 2014-10-12 ENCOUNTER — Encounter: Payer: Self-pay | Admitting: Radiation Oncology

## 2014-10-12 NOTE — Progress Notes (Signed)
Location of Breast Cancer: upper-outer left breast  Histology per Pathology Report:  09/23/14 Diagnosis 1. Breast, modified radical mastectomy , left - INVASIVE GRADE II DUCTAL CARCINOMA WITH ABUNDANT EXTRACELLULAR MUCIN, SPANNING 6.2 CM IN GREATEST DIMENSION. - MARGINS ARE NEGATIVE. - SEE ONCOLOGY TEMPLATE. 2. Lymph nodes, regional resection, non-sentinel - TWO BENIGN LYMPH NODES WITH NO TUMOR SEEN (0/2). 3. Lymph node, sentinel, biopsy, left axillary #1 - ONE BENIGN LYMPH NODE WITH NO TUMOR SEEN (0/1). 4. Lymph node, sentinel, biopsy, left axillary #2 - ONE BENIGN LYMPH NODE WITH NO TUMOR SEEN (0/1).  Receptor Status: ER(97%), PR (98%), Her2-neu (-) Ki-67 47%  [name] had a screening mammogram detected a left breast mass measuring 5.7 cm. Prior to that she has always felt an abnormality in the breast however she thought it was just because she has dense breasts.  Past/Anticipated interventions by surgeon, if any: Dr Ingram: left modified radical mastectomy  Past/Anticipated interventions by medical oncology, if any: Chemotherapy Dr Gudena: Recommendation: Adjuvant chemotherapy with dose dense Adriamycin and Cytoxan 4 followed by Abraxane 12 weekly followed by radiation and anti-estrogen therapy.  Lymphedema issues, if any:    no  Pain issues, if any:     SAFETY ISSUES:  Prior radiation?  no  Pacemaker/ICD? No  Possible current pregnancy? no  Is the patient on methotrexate? 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 No family hx breast cancer.     [name], Amy Clare, RN 10/12/2014,1:06 PM   

## 2014-10-13 ENCOUNTER — Ambulatory Visit
Admission: RE | Admit: 2014-10-13 | Discharge: 2014-10-13 | Disposition: A | Discharge status: Home / Self Care | Payer: BLUE CROSS/BLUE SHIELD | Source: Ambulatory Visit | Attending: Radiation Oncology | Admitting: Radiation Oncology

## 2014-10-13 ENCOUNTER — Encounter: Payer: Self-pay | Admitting: Radiation Oncology

## 2014-10-13 ENCOUNTER — Other Ambulatory Visit: Payer: Self-pay | Admitting: Internal Medicine

## 2014-10-13 VITALS — BP 145/93 | HR 75 | Temp 98.5°F | Resp 20 | Wt 156.0 lb

## 2014-10-13 DIAGNOSIS — C50412 Malignant neoplasm of upper-outer quadrant of left female breast: Secondary | ICD-10-CM

## 2014-10-13 DIAGNOSIS — Z90710 Acquired absence of both cervix and uterus: Secondary | ICD-10-CM | POA: Insufficient documentation

## 2014-10-13 DIAGNOSIS — Z170 Estrogen receptor positive status [ER+]: Secondary | ICD-10-CM | POA: Insufficient documentation

## 2014-10-13 DIAGNOSIS — Z9012 Acquired absence of left breast and nipple: Secondary | ICD-10-CM | POA: Insufficient documentation

## 2014-10-13 NOTE — Progress Notes (Signed)
Please see the Nurse Progress Note in the MD Initial Consult Encounter for this patient. 

## 2014-10-13 NOTE — Progress Notes (Signed)
Dr. Fanny Skates, Dr. Tami Lin, Dr. 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  At the time of a [facility] mammogram on January 12 she was felt to have a possible left breast mass. Additional views and ultrasound on 08/30/2014 showed a 5.7 x 4.6 x 5.6 cm mass at 3:00. On ultrasound this was seen to be adjacent to the areola at 3:00. Ultrasound of the axilla was negative. A biopsy on 09/06/2014 was diagnostic for invasive ductal carcinoma with abundant mucin. The tumor was ER positive at 97%, PR positive at 98% with an elevated Ki-67 of 47%. HER-2/neu negative. Breast MR showed a 6.5 cm mass with 2 satellite lesions measuring 8 mm just 5 mm anterior and superior to the dominant mass. There were no suspicious axillary findings.  She had been on hormone replacement therapy for 42 years since having a total hysterectomy for benign disease at age 21.  On 09/23/2014 showed underwent a left mastectomy and sentinel lymph node biopsy.  She was found have a 6.2 cm invasive grade 2 ductal carcinoma with abundant extracellular mucin.  Surgical margins were negative with the closest margin being 0.5 cm (deep margin).  There was no LV I.  2 sentinel, and 2 non-sentinel lymph nodes (total of 4 lymph nodes) were free of metastatic disease.  She is getting prepared to start adjuvant chemotherapy next week under the direction of Dr. Lindi Adie.  Physical examination: Alert and oriented. Filed Vitals:   10/13/14 0736  BP: 145/93  Pulse: 75  Temp: 98.5 F (36.9 C)  Resp: 20   She is not examined today.  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  This can be decreased to less than 5%.  Based on the NCCN guidelines, chest wall irradiation should be considered.  Nodal irradiation is optional.  We discussed the potential acute and late toxicities of radiation therapy and also the implications of chest wall radiation therapy on breast reconstruction.  She understands that implant reconstruction would be ill-advised following chest wall radiation and she would need to consider autologous reconstruction, perhaps with a latissimus dorsi flap.  I told her that Dr. Dalbert Batman could arrange for her to see a plastic surgeon at the appropriate time.  Plan: Proceed with chemotherapy as prescribed by Dr. Lindi Adie.  I asked that Dr. Lindi Adie contact me for a follow-up visit when she nears completion of chemotherapy later this summer.  20 minutes was spent face-to-face with the patient, primarily counseling the patient.

## 2014-10-14 ENCOUNTER — Other Ambulatory Visit: Payer: Self-pay | Admitting: Physician Assistant

## 2014-10-17 ENCOUNTER — Encounter: Payer: Self-pay | Admitting: Physician Assistant

## 2014-10-17 ENCOUNTER — Encounter: Payer: Self-pay | Admitting: Pharmacist

## 2014-10-17 NOTE — Telephone Encounter (Signed)
Called in.

## 2014-10-18 ENCOUNTER — Other Ambulatory Visit: Payer: Self-pay | Admitting: *Deleted

## 2014-10-18 DIAGNOSIS — C50412 Malignant neoplasm of upper-outer quadrant of left female breast: Secondary | ICD-10-CM

## 2014-10-18 MED ORDER — UNABLE TO FIND: 1.0000 | Freq: Once | Status: DC

## 2014-10-19 ENCOUNTER — Telehealth: Payer: Self-pay

## 2014-10-19 MED ORDER — UNABLE TO FIND: 1.0000 | Freq: Once | Status: DC

## 2014-10-19 NOTE — Telephone Encounter (Signed)
Per below

## 2014-10-19 NOTE — Telephone Encounter (Signed)
-----   Message from Joellyn Haff sent at 10/19/2014 10:41 AM EST ----- Regarding: RE: 1st chemo 3/10 Aloxi and Emend was approved from 10/17/14 to 10/17/15.  ----- Message -----    From: Prentiss Bells, RN    Sent: 10/11/2014   9:12 AM      To: Joellyn Haff Subject: 1st chemo 3/10                                 Adriamycin, cytoxan, abraxane, neulasta, emend, aloxi

## 2014-10-20 ENCOUNTER — Ambulatory Visit (HOSPITAL_BASED_OUTPATIENT_CLINIC_OR_DEPARTMENT_OTHER): Payer: BLUE CROSS/BLUE SHIELD

## 2014-10-20 ENCOUNTER — Other Ambulatory Visit (HOSPITAL_BASED_OUTPATIENT_CLINIC_OR_DEPARTMENT_OTHER): Payer: BLUE CROSS/BLUE SHIELD

## 2014-10-20 ENCOUNTER — Other Ambulatory Visit: Payer: Self-pay | Admitting: Hematology and Oncology

## 2014-10-20 ENCOUNTER — Encounter: Payer: Self-pay | Admitting: *Deleted

## 2014-10-20 ENCOUNTER — Telehealth: Payer: Self-pay | Admitting: *Deleted

## 2014-10-20 DIAGNOSIS — Z5189 Encounter for other specified aftercare: Secondary | ICD-10-CM

## 2014-10-20 DIAGNOSIS — C50812 Malignant neoplasm of overlapping sites of left female breast: Secondary | ICD-10-CM

## 2014-10-20 DIAGNOSIS — C50412 Malignant neoplasm of upper-outer quadrant of left female breast: Secondary | ICD-10-CM

## 2014-10-20 DIAGNOSIS — Z5111 Encounter for antineoplastic chemotherapy: Secondary | ICD-10-CM

## 2014-10-20 LAB — COMPREHENSIVE METABOLIC PANEL (CC13)
ALBUMIN: 4.1 g/dL (ref 3.5–5.0)
ALK PHOS: 47 U/L (ref 40–150)
ALT: 19 U/L (ref 0–55)
AST: 15 U/L (ref 5–34)
Anion Gap: 13 mEq/L — ABNORMAL HIGH (ref 3–11)
BILIRUBIN TOTAL: 0.29 mg/dL (ref 0.20–1.20)
BUN: 7.8 mg/dL (ref 7.0–26.0)
CO2: 23 mEq/L (ref 22–29)
Calcium: 9.5 mg/dL (ref 8.4–10.4)
Chloride: 104 mEq/L (ref 98–109)
Creatinine: 0.7 mg/dL (ref 0.6–1.1)
EGFR: >90 mL/min/{1.73_m2} (ref >90)
Glucose: 115 mg/dl (ref 70–140)
Potassium: 3.6 mEq/L (ref 3.5–5.1)
Sodium: 139 mEq/L (ref 136–145)
Total Protein: 6.8 g/dL (ref 6.4–8.3)

## 2014-10-20 LAB — CBC WITH DIFFERENTIAL/PLATELET
BASO%: 0.1 % (ref 0.0–2.0)
Basophils Absolute: 0 10*3/uL (ref 0.0–0.1)
EOS%: 0.2 % (ref 0.0–7.0)
Eosinophils Absolute: 0 10*3/uL (ref 0.0–0.5)
HCT: 38 % (ref 34.8–46.6)
HEMOGLOBIN: 12.6 g/dL (ref 11.6–15.9)
LYMPH%: 18.2 % (ref 14.0–49.7)
MCH: 28.4 pg (ref 25.1–34.0)
MCHC: 33.2 g/dL (ref 31.5–36.0)
MCV: 85.6 fL (ref 79.5–101.0)
MONO#: 1.6 10*3/uL — ABNORMAL HIGH (ref 0.1–0.9)
MONO%: 12.1 % (ref 0.0–14.0)
NEUT#: 9.1 10*3/uL — ABNORMAL HIGH (ref 1.5–6.5)
NEUT%: 69.4 % (ref 38.4–76.8)
Platelets: 472 10*3/uL — ABNORMAL HIGH (ref 145–400)
RBC: 4.44 10*6/uL (ref 3.70–5.45)
RDW: 12.4 % (ref 11.2–14.5)
WBC: 13.1 10*3/uL — AB (ref 3.9–10.3)
lymph#: 2.4 10*3/uL (ref 0.9–3.3)

## 2014-10-20 MED ORDER — PEGFILGRASTIM 6 MG/0.6ML ~~LOC~~ PSKT
6.0000 mg | PREFILLED_SYRINGE | Freq: Once | SUBCUTANEOUS | Status: AC
  Administered 2014-10-20: 6 mg via SUBCUTANEOUS
  Filled 2014-10-20: qty 0.6

## 2014-10-20 MED ORDER — SODIUM CHLORIDE 0.9 % IV SOLN
600.0000 mg/m2 | Freq: Once | INTRAVENOUS | Status: AC
  Administered 2014-10-20: 1100 mg via INTRAVENOUS
  Filled 2014-10-20: qty 55

## 2014-10-20 MED ORDER — PALONOSETRON HCL INJECTION 0.25 MG/5ML
INTRAVENOUS | Status: AC
  Filled 2014-10-20: qty 5

## 2014-10-20 MED ORDER — PALONOSETRON HCL INJECTION 0.25 MG/5ML
0.2500 mg | Freq: Once | INTRAVENOUS | Status: AC
  Administered 2014-10-20: 0.25 mg via INTRAVENOUS

## 2014-10-20 MED ORDER — SODIUM CHLORIDE 0.9 % IV SOLN
Freq: Once | INTRAVENOUS | Status: AC
  Administered 2014-10-20: 10:00:00 via INTRAVENOUS
  Filled 2014-10-20: qty 5

## 2014-10-20 MED ORDER — SODIUM CHLORIDE 0.9 % IJ SOLN
10.0000 mL | INTRAMUSCULAR | Status: DC | PRN
  Administered 2014-10-20: 10 mL
  Filled 2014-10-20: qty 10

## 2014-10-20 MED ORDER — SODIUM CHLORIDE 0.9 % IV SOLN
Freq: Once | INTRAVENOUS | Status: AC
  Administered 2014-10-20: 09:00:00 via INTRAVENOUS

## 2014-10-20 MED ORDER — DOXORUBICIN HCL CHEMO IV INJECTION 2 MG/ML
60.0000 mg/m2 | Freq: Once | INTRAVENOUS | Status: AC
  Administered 2014-10-20: 110 mg via INTRAVENOUS
  Filled 2014-10-20: qty 55

## 2014-10-20 MED ORDER — HEPARIN SOD (PORK) LOCK FLUSH 100 UNIT/ML IV SOLN
500.0000 [IU] | Freq: Once | INTRAVENOUS | Status: AC | PRN
  Administered 2014-10-20: 500 [IU]
  Filled 2014-10-20: qty 5

## 2014-10-20 NOTE — Progress Notes (Unsigned)
Spoke with patient today at her 1st chemo.  She accompanied today by her husband.  She is doing well.  Wig prescription and voucher given.  Encouraged her to call with any needs or concerns.

## 2014-10-20 NOTE — Patient Instructions (Signed)
[facility] Discharge Instructions for Patients Receiving Chemotherapy  Today you received the following chemotherapy agents Adriamycin and cytoxan  To help prevent nausea and vomiting after your treatment, we encourage you to take your nausea medication, Compazine and Zofran   If you develop nausea and vomiting that is not controlled by your nausea medication, call the clinic.   BELOW ARE SYMPTOMS THAT SHOULD BE REPORTED IMMEDIATELY:  *FEVER GREATER THAN 100.5 F  *CHILLS WITH OR WITHOUT FEVER  NAUSEA AND VOMITING THAT IS NOT CONTROLLED WITH YOUR NAUSEA MEDICATION  *UNUSUAL SHORTNESS OF BREATH  *UNUSUAL BRUISING OR BLEEDING  TENDERNESS IN MOUTH AND THROAT WITH OR WITHOUT PRESENCE OF ULCERS  *URINARY PROBLEMS  *BOWEL PROBLEMS  UNUSUAL RASH Items with * indicate a potential emergency and should be followed up as soon as possible.  Feel free to call the clinic you have any questions or concerns. The clinic phone number is [phone].

## 2014-10-21 ENCOUNTER — Ambulatory Visit: Payer: Medicare Other

## 2014-10-26 ENCOUNTER — Other Ambulatory Visit: Payer: Self-pay | Admitting: *Deleted

## 2014-10-26 DIAGNOSIS — C50412 Malignant neoplasm of upper-outer quadrant of left female breast: Secondary | ICD-10-CM

## 2014-10-27 ENCOUNTER — Ambulatory Visit (HOSPITAL_BASED_OUTPATIENT_CLINIC_OR_DEPARTMENT_OTHER): Payer: BLUE CROSS/BLUE SHIELD | Admitting: Hematology and Oncology

## 2014-10-27 ENCOUNTER — Other Ambulatory Visit (HOSPITAL_BASED_OUTPATIENT_CLINIC_OR_DEPARTMENT_OTHER): Payer: BLUE CROSS/BLUE SHIELD

## 2014-10-27 VITALS — BP 140/79 | HR 82 | Temp 97.9°F | Resp 18 | Ht 66.0 in | Wt 150.1 lb

## 2014-10-27 DIAGNOSIS — C50812 Malignant neoplasm of overlapping sites of left female breast: Secondary | ICD-10-CM

## 2014-10-27 DIAGNOSIS — C50412 Malignant neoplasm of upper-outer quadrant of left female breast: Secondary | ICD-10-CM

## 2014-10-27 DIAGNOSIS — D701 Agranulocytosis secondary to cancer chemotherapy: Secondary | ICD-10-CM

## 2014-10-27 DIAGNOSIS — R197 Diarrhea, unspecified: Secondary | ICD-10-CM

## 2014-10-27 DIAGNOSIS — R110 Nausea: Secondary | ICD-10-CM

## 2014-10-27 LAB — COMPREHENSIVE METABOLIC PANEL (CC13)
ALT: 13 U/L (ref 0–55)
AST: 9 U/L (ref 5–34)
Albumin: 3.5 g/dL (ref 3.5–5.0)
Alkaline Phosphatase: 52 U/L (ref 40–150)
Anion Gap: 7 mEq/L (ref 3–11)
BILIRUBIN TOTAL: 0.27 mg/dL (ref 0.20–1.20)
BUN: 11.9 mg/dL (ref 7.0–26.0)
CALCIUM: 8.5 mg/dL (ref 8.4–10.4)
CO2: 25 mEq/L (ref 22–29)
Chloride: 101 mEq/L (ref 98–109)
Creatinine: 0.7 mg/dL (ref 0.6–1.1)
EGFR: >90 mL/min/{1.73_m2} (ref >90)
Glucose: 94 mg/dl (ref 70–140)
POTASSIUM: 3.9 meq/L (ref 3.5–5.1)
Sodium: 133 mEq/L — ABNORMAL LOW (ref 136–145)
Total Protein: 6.1 g/dL — ABNORMAL LOW (ref 6.4–8.3)

## 2014-10-27 LAB — CBC WITH DIFFERENTIAL/PLATELET
BASO%: 0.3 % (ref 0.0–2.0)
Basophils Absolute: 0 10*3/uL (ref 0.0–0.1)
EOS%: 4.6 % (ref 0.0–7.0)
Eosinophils Absolute: 0.1 10*3/uL (ref 0.0–0.5)
HCT: 35.8 % (ref 34.8–46.6)
HGB: 12 g/dL (ref 11.6–15.9)
LYMPH%: 71.9 % — ABNORMAL HIGH (ref 14.0–49.7)
MCH: 28.3 pg (ref 25.1–34.0)
MCHC: 33.5 g/dL (ref 31.5–36.0)
MCV: 84.4 fL (ref 79.5–101.0)
MONO#: 0.5 10*3/uL (ref 0.1–0.9)
MONO%: 17.6 % — AB (ref 0.0–14.0)
NEUT%: 5.6 % — ABNORMAL LOW (ref 38.4–76.8)
NEUTROS ABS: 0.2 10*3/uL — AB (ref 1.5–6.5)
Platelets: 247 10*3/uL (ref 145–400)
RBC: 4.24 10*6/uL (ref 3.70–5.45)
RDW: 12.4 % (ref 11.2–14.5)
WBC: 3.1 10*3/uL — ABNORMAL LOW (ref 3.9–10.3)
lymph#: 2.2 10*3/uL (ref 0.9–3.3)

## 2014-10-27 NOTE — Progress Notes (Signed)
Patient Care Team: Leandrew Koyanagi, MD as PCP - General (Family Medicine) Fanny Skates, MD as Consulting Physician (General Surgery) Nicholas Lose, MD as Consulting Physician (Hematology and Oncology) Arloa Koh, MD as Consulting Physician (Radiation Oncology) Mauro Kaufmann, RN as Registered Nurse Rockwell Germany, RN as Registered Nurse Holley Bouche, NP as Nurse Practitioner (Nurse Practitioner)  DIAGNOSIS: Breast cancer of upper-outer quadrant of left female breast   Staging form: Breast, AJCC 7th Edition     Clinical stage from 09/14/2014: Stage IIB (T3, N0, M0) - Unsigned     Pathologic stage from 09/26/2014: Stage IIB (T3, N0, cM0) - Signed by Seward Grater, MD on 10/04/2014       Staging comments: Staged on final mastectomy by Dr. Avis Epley.    SUMMARY OF ONCOLOGIC HISTORY:   Breast cancer of upper-outer quadrant of left female breast   09/06/2014 Initial Diagnosis Left breast 3:00 biopsy: Invasive ductal carcinoma with abundant extracellular mucin, ER 97%, PR 98%, Ki-67 47%, HER-2 negative ratio 1.28   09/12/2014 Breast MRI Left breast: 6.5 x 5.3 x 5.1 cm irregular enhancing mass with 2 adjacent 8 mm satellite masses, no lymphadenopathy   09/23/2014 Surgery Left Mastectomy: IDC grade 2 with extracellular mucin 6.2 cm, 4 LN Neg, ER 97%, PR 98%, Her 2 Neg ratio 1.28, Ki 67 47% T3N0 Stage 2B   10/20/2014 -  Chemotherapy Adjuvant chemotherapy with dose since Adriamycin and Cytoxan 4 followed by Abraxane weekly 12    CHIEF COMPLIANT: cycle 1 day 8 dose dense Adriamycin and Cytoxan  INTERVAL HISTORY: Amy [name] is a 64 year old lady with above-mentioned history of left-sided breast cancer treated with mastectomy and is now on adjuvant chemotherapy. She received cycle 1 of chemotherapy with Adriamycin and Cytoxan last week and is here today for toxicity check. She reports that she felt very tired after chemotherapy as well as nausea. She took Compazine which helped her  symptoms tremendously. She also had diarrhea for which she had to take Imodium. She is now starting to feel better energy level is coming back.Zofran causes her headache.  REVIEW OF SYSTEMS:   Constitutional: Denies fevers, chills or abnormal weight loss Eyes: Denies blurriness of vision Ears, nose, mouth, throat, and face: Denies mucositis or sore throat Respiratory: Denies cough, dyspnea or wheezes Cardiovascular: Denies palpitation, chest discomfort or lower extremity swelling Gastrointestinal:  Nausea and diarrhea Skin: Denies abnormal skin rashes Lymphatics: Denies new lymphadenopathy or easy bruising Neurological:Denies numbness, tingling or new weaknesses Behavioral/Psych: Mood is stable, no new changes  Breast:  denies any pain or lumps or nodules in either breasts All other systems were reviewed with the patient and are negative.  I have reviewed the past medical history, past surgical history, social history and family history with the patient and they are unchanged from previous note.  ALLERGIES:  is allergic to codeine; darvocet; gold-containing drug products; tramadol; and sulfa antibiotics.  MEDICATIONS:  Current Outpatient Prescriptions  Medication Sig Dispense Refill  . ALPRAZolam (XANAX) 0.25 MG tablet TAKE 1 TABLET TWICE A DAY AS NEEDED 60 tablet 0  . cetirizine (ZYRTEC) 10 MG tablet Take 10 mg by mouth daily.    . citalopram (CELEXA) 40 MG tablet TAKE 1 TABLET (40 MG TOTAL) BY MOUTH DAILY. 90 tablet 3  . dexamethasone (DECADRON) 4 MG tablet Take 2 tablets by mouth once a day on the day after chemotherapy and then take 2 tablets two times a day for 2 days. Take with food. 30 tablet  1  . HYDROcodone-acetaminophen (NORCO) 5-325 MG per tablet Take 1 tablet by mouth every 4 (four) hours as needed. For pain    . lidocaine-prilocaine (EMLA) cream Apply to affected area once 30 g 3  . lisinopril (PRINIVIL,ZESTRIL) 5 MG tablet Take 1 tablet (5 mg total) by mouth daily. 90  tablet 3  . LORazepam (ATIVAN) 0.5 MG tablet Take 1 tablet (0.5 mg total) by mouth every 6 (six) hours as needed (Nausea or vomiting). 30 tablet 0  . metoprolol succinate (TOPROL-XL) 100 MG 24 hr tablet Take 1 tablet (100 mg total) by mouth daily. Take with or immediately following a meal. 90 tablet 3  . ondansetron (ZOFRAN) 4 MG tablet Take 1 tablet (4 mg total) by mouth every 8 (eight) hours as needed for nausea. 20 tablet 0  . phenylephrine (SUDAFED PE) 10 MG TABS tablet Take 10 mg by mouth daily as needed (allergies).    . prochlorperazine (COMPAZINE) 10 MG tablet Take 1 tablet (10 mg total) by mouth every 6 (six) hours as needed (Nausea or vomiting). 30 tablet 1  . SUMAtriptan (IMITREX) 100 MG tablet Take 100 mg by mouth every 2 (two) hours as needed for migraine. May repeat in 2 hours if headache persists or recurs.    Marland Kitchen UNABLE TO FIND 1 each by Other route once. 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 verapamil (VERELAN PM) 180 MG 24 hr capsule TAKE 1 CAPSULE (180 MG TOTAL) BY MOUTH 2 (TWO) TIMES DAILY. 180 capsule 3  . zolpidem (AMBIEN) 10 MG tablet Take 1 tablet (10 mg total) by mouth at bedtime. 30 tablet 1  . ondansetron (ZOFRAN) 8 MG tablet Take 1 tablet (8 mg total) by mouth 2 (two) times daily as needed. Start on the third day after chemotherapy. (Patient not taking: Reported on 10/27/2014) 30 tablet 1   No current facility-administered medications for this visit.    PHYSICAL EXAMINATION: ECOG PERFORMANCE STATUS: 1 - Symptomatic but completely ambulatory  Filed Vitals:   10/27/14 0932  BP: 140/79  Pulse: 82  Temp: 97.9 F (36.6 C)  Resp: 18   Filed Weights   10/27/14 0932  Weight: 150 lb 1.6 oz (68.085 kg)    GENERAL:alert, no distress and comfortable SKIN: skin color, texture, turgor are normal, no rashes or significant lesions EYES: normal, Conjunctiva are pink and non-injected, sclera  clear OROPHARYNX:no exudate, no erythema and lips, buccal mucosa, and tongue normal  NECK: supple, thyroid normal size, non-tender, without nodularity LYMPH:  no palpable lymphadenopathy in the cervical, axillary or inguinal LUNGS: clear to auscultation and percussion with normal breathing effort HEART: regular rate & rhythm and no murmurs and no lower extremity edema ABDOMEN:abdomen soft, non-tender and normal bowel sounds Musculoskeletal:no cyanosis of digits and no clubbing  NEURO: alert & oriented x 3 with fluent speech, no focal motor/sensory deficits   LABORATORY DATA:  I have reviewed the data as listed   Chemistry      Component Value Date/Time   NA 133* 10/27/2014 0916   NA 137 09/20/2014 1553   K 3.9 10/27/2014 0916   K 3.7 09/20/2014 1553   CL 104 09/20/2014 1553   CO2 25 10/27/2014 0916   CO2 21 09/20/2014 1553   BUN 11.9 10/27/2014 0916   BUN 8 09/20/2014 1553   CREATININE 0.7 10/27/2014 0916   CREATININE 0.68 09/23/2014 1920   CREATININE 0.56 08/16/2014 1208  Component Value Date/Time   CALCIUM 8.5 10/27/2014 0916   CALCIUM 9.1 09/20/2014 1553   ALKPHOS 52 10/27/2014 0916   ALKPHOS 34* 09/20/2014 1553   AST 9 10/27/2014 0916   AST 18 09/20/2014 1553   ALT 13 10/27/2014 0916   ALT 11 09/20/2014 1553   BILITOT 0.27 10/27/2014 0916   BILITOT 0.2* 09/20/2014 1553       Lab Results  Component Value Date   WBC 3.1* 10/27/2014   HGB 12.0 10/27/2014   HCT 35.8 10/27/2014   MCV 84.4 10/27/2014   PLT 247 10/27/2014   NEUTROABS 0.2* 10/27/2014     RADIOGRAPHIC STUDIES: I have personally reviewed the radiology reports and agreed with their findings. No results found.   ASSESSMENT & PLAN:  Breast cancer of upper-outer quadrant of left female breast Left Mastectomy 09/23/14: IDC grade 2 with extracellular mucin 6.2 cm, 4 LN Neg, ER 97%, PR 98%, Her 2 Neg ratio 1.28, Ki 67 47% T3N0 Stage 2B  Treatment plan: adjuvant chemotherapy withDose dense  Adriamycin and Cytoxan 4 followed by Abraxane weekly 12 followed by radiation and antiestrogen therapy  Current treatment: Today cycle 1 day 8 dose dense Adriamycin and Cytoxan  Toxicities of chemotherapy: 1. Severe fatigue 2. Chemotherapy-induced neutropenia grade 4: Decreasing the dose of cycle 2 of chemotherapy 3. Nausea which improved with Compazine. Zofran causes migraines. For the next cycle we will give her Phenergan in addition to Compazine for when necessary nausea medicine. 4. Intermittent diarrhea: Improved with Imodium.  Return to clinic in 1 week for second cycle. She would like to not get the Neulasta on body injected. She would like to receive the subcutaneous injection the day after chemotherapy. Because the on body injector makes her not fall asleep.     No orders of the defined types were placed in this encounter.   The patient has a good understanding of the overall plan. she agrees with it. She will call with any problems that may develop before her next visit here.   Rulon Eisenmenger, MD

## 2014-10-27 NOTE — Assessment & Plan Note (Signed)
Left Mastectomy 09/23/14: IDC grade 2 with extracellular mucin 6.2 cm, 4 LN Neg, ER 97%, PR 98%, Her 2 Neg ratio 1.28, Ki 67 47% T3N0 Stage 2B  Treatment plan: adjuvant chemotherapy withDose dense Adriamycin and Cytoxan 4 followed by Abraxane weekly 12 followed by radiation and antiestrogen therapy  Current treatment: Today cycle 1 day 8 dose dense Adriamycin and Cytoxan  Toxicities of chemotherapy: 1. Severe fatigue 2. Chemotherapy-induced neutropenia grade 4: Decreasing the dose of cycle 2 of chemotherapy 3. Nausea which improved with Compazine. Zofran causes migraines. For the next cycle we will give her Phenergan in addition to Compazine for when necessary nausea medicine. 4. Intermittent diarrhea: Improved with Imodium.  Return to clinic in 1 week for second cycle. She would like to not get the Neulasta on body injected. She would like to receive the subcutaneous injection the day after chemotherapy. Because the on body injector makes her not fall asleep.

## 2014-10-28 ENCOUNTER — Telehealth: Payer: Self-pay

## 2014-10-28 NOTE — Telephone Encounter (Signed)
Patient of [name], [place]. States she has an upcoming appt in July but she is in the middle of chemotherapy and it wont be done until August. She wants to know what Chelle wants her to do regarding her appt. Cb# [phone].

## 2014-10-31 NOTE — Telephone Encounter (Signed)
Please advise 

## 2014-11-01 ENCOUNTER — Other Ambulatory Visit: Payer: Self-pay | Admitting: *Deleted

## 2014-11-01 ENCOUNTER — Telehealth: Payer: Self-pay | Admitting: *Deleted

## 2014-11-01 DIAGNOSIS — C50412 Malignant neoplasm of upper-outer quadrant of left female breast: Secondary | ICD-10-CM

## 2014-11-01 MED ORDER — PROMETHAZINE HCL 25 MG PO TABS: 25.0000 mg | ORAL_TABLET | Freq: Four times a day (QID) | ORAL | Status: DC | PRN

## 2014-11-01 NOTE — Telephone Encounter (Signed)
Received call from patient's husband yesterday stating Dr. Lindi Adie instructed to stop taking the Zofran due to headaches.  He was asking if she needed anything else in place of the zofran.  Per Dr. Lindi Adie we can call in phenergan.  This was sent to her pharmacy.  Instructed her husband they can call if any needs or concerns arise.

## 2014-11-01 NOTE — Telephone Encounter (Signed)
Left a detailed message on pt's voicemail letting her know the message from [place].

## 2014-11-01 NOTE — Telephone Encounter (Signed)
Please let this patient know that if she'd rather wait until she completes chemotherapy to follow-up with me, that's totally fine. If she'd like to keep her appointment in July, that's fine, too. Whatever works best for her!

## 2014-11-02 ENCOUNTER — Other Ambulatory Visit: Payer: Self-pay | Admitting: *Deleted

## 2014-11-02 DIAGNOSIS — C44611 Basal cell carcinoma of skin of unspecified upper limb, including shoulder: Secondary | ICD-10-CM

## 2014-11-03 ENCOUNTER — Ambulatory Visit (HOSPITAL_BASED_OUTPATIENT_CLINIC_OR_DEPARTMENT_OTHER): Payer: BLUE CROSS/BLUE SHIELD | Admitting: Hematology and Oncology

## 2014-11-03 ENCOUNTER — Telehealth: Payer: Self-pay | Admitting: Hematology and Oncology

## 2014-11-03 ENCOUNTER — Other Ambulatory Visit (HOSPITAL_BASED_OUTPATIENT_CLINIC_OR_DEPARTMENT_OTHER): Payer: BLUE CROSS/BLUE SHIELD

## 2014-11-03 ENCOUNTER — Ambulatory Visit (HOSPITAL_BASED_OUTPATIENT_CLINIC_OR_DEPARTMENT_OTHER): Payer: BLUE CROSS/BLUE SHIELD

## 2014-11-03 ENCOUNTER — Telehealth: Payer: Self-pay | Admitting: *Deleted

## 2014-11-03 ENCOUNTER — Encounter: Payer: Self-pay | Admitting: Adult Health

## 2014-11-03 VITALS — BP 141/81 | HR 78 | Temp 97.9°F | Resp 20 | Ht 66.0 in | Wt 151.4 lb

## 2014-11-03 DIAGNOSIS — R110 Nausea: Secondary | ICD-10-CM

## 2014-11-03 DIAGNOSIS — C50812 Malignant neoplasm of overlapping sites of left female breast: Secondary | ICD-10-CM

## 2014-11-03 DIAGNOSIS — Z5111 Encounter for antineoplastic chemotherapy: Secondary | ICD-10-CM

## 2014-11-03 DIAGNOSIS — R5383 Other fatigue: Secondary | ICD-10-CM

## 2014-11-03 DIAGNOSIS — C50412 Malignant neoplasm of upper-outer quadrant of left female breast: Secondary | ICD-10-CM

## 2014-11-03 DIAGNOSIS — D701 Agranulocytosis secondary to cancer chemotherapy: Secondary | ICD-10-CM

## 2014-11-03 DIAGNOSIS — R197 Diarrhea, unspecified: Secondary | ICD-10-CM | POA: Diagnosis not present

## 2014-11-03 DIAGNOSIS — C44611 Basal cell carcinoma of skin of unspecified upper limb, including shoulder: Secondary | ICD-10-CM

## 2014-11-03 LAB — CBC WITH DIFFERENTIAL/PLATELET
BASO%: 0.9 % (ref 0.0–2.0)
BASOS ABS: 0.2 10*3/uL — AB (ref 0.0–0.1)
EOS ABS: 0 10*3/uL (ref 0.0–0.5)
EOS%: 0.2 % (ref 0.0–7.0)
HCT: 35.8 % (ref 34.8–46.6)
HEMOGLOBIN: 11.7 g/dL (ref 11.6–15.9)
LYMPH%: 18.6 % (ref 14.0–49.7)
MCH: 28.3 pg (ref 25.1–34.0)
MCHC: 32.7 g/dL (ref 31.5–36.0)
MCV: 86.7 fL (ref 79.5–101.0)
MONO#: 1.2 10*3/uL — AB (ref 0.1–0.9)
MONO%: 5.7 % (ref 0.0–14.0)
NEUT%: 74.6 % (ref 38.4–76.8)
NEUTROS ABS: 15.6 10*3/uL — AB (ref 1.5–6.5)
Platelets: 260 10*3/uL (ref 145–400)
RBC: 4.13 10*6/uL (ref 3.70–5.45)
RDW: 12.9 % (ref 11.2–14.5)
WBC: 20.9 10*3/uL — AB (ref 3.9–10.3)
lymph#: 3.9 10*3/uL — ABNORMAL HIGH (ref 0.9–3.3)

## 2014-11-03 LAB — COMPREHENSIVE METABOLIC PANEL (CC13)
ALBUMIN: 3.6 g/dL (ref 3.5–5.0)
ALT: 14 U/L (ref 0–55)
AST: 12 U/L (ref 5–34)
Alkaline Phosphatase: 63 U/L (ref 40–150)
Anion Gap: 8 mEq/L (ref 3–11)
BUN: 8.1 mg/dL (ref 7.0–26.0)
CALCIUM: 8.7 mg/dL (ref 8.4–10.4)
CHLORIDE: 104 meq/L (ref 98–109)
CO2: 26 mEq/L (ref 22–29)
CREATININE: 0.7 mg/dL (ref 0.6–1.1)
EGFR: >90 mL/min/{1.73_m2} (ref >90)
Glucose: 126 mg/dl (ref 70–140)
POTASSIUM: 3.9 meq/L (ref 3.5–5.1)
Sodium: 138 mEq/L (ref 136–145)
TOTAL PROTEIN: 6.1 g/dL — AB (ref 6.4–8.3)
Total Bilirubin: <0.2 mg/dL (ref 0.20–1.20)

## 2014-11-03 MED ORDER — SODIUM CHLORIDE 0.9 % IV SOLN
Freq: Once | INTRAVENOUS | Status: AC
  Administered 2014-11-03: 10:00:00 via INTRAVENOUS
  Filled 2014-11-03: qty 5

## 2014-11-03 MED ORDER — SODIUM CHLORIDE 0.9 % IJ SOLN
10.0000 mL | INTRAMUSCULAR | Status: DC | PRN
  Administered 2014-11-03: 10 mL
  Filled 2014-11-03: qty 10

## 2014-11-03 MED ORDER — HEPARIN SOD (PORK) LOCK FLUSH 100 UNIT/ML IV SOLN
500.0000 [IU] | Freq: Once | INTRAVENOUS | Status: AC | PRN
  Administered 2014-11-03: 500 [IU]
  Filled 2014-11-03: qty 5

## 2014-11-03 MED ORDER — DOXORUBICIN HCL CHEMO IV INJECTION 2 MG/ML
50.0000 mg/m2 | Freq: Once | INTRAVENOUS | Status: AC
  Administered 2014-11-03: 92 mg via INTRAVENOUS
  Filled 2014-11-03: qty 46

## 2014-11-03 MED ORDER — PALONOSETRON HCL INJECTION 0.25 MG/5ML
INTRAVENOUS | Status: AC
  Filled 2014-11-03: qty 5

## 2014-11-03 MED ORDER — PALONOSETRON HCL INJECTION 0.25 MG/5ML
0.2500 mg | Freq: Once | INTRAVENOUS | Status: AC
  Administered 2014-11-03: 0.25 mg via INTRAVENOUS

## 2014-11-03 MED ORDER — SODIUM CHLORIDE 0.9 % IV SOLN
Freq: Once | INTRAVENOUS | Status: AC
  Administered 2014-11-03: 10:00:00 via INTRAVENOUS

## 2014-11-03 MED ORDER — SODIUM CHLORIDE 0.9 % IV SOLN
500.0000 mg/m2 | Freq: Once | INTRAVENOUS | Status: AC
  Administered 2014-11-03: 920 mg via INTRAVENOUS
  Filled 2014-11-03: qty 46

## 2014-11-03 NOTE — Patient Instructions (Signed)
[facility] Discharge Instructions for Patients Receiving Chemotherapy  Today you received the following chemotherapy agents Adriamycin/Cytoxan.  To help prevent nausea and vomiting after your treatment, we encourage you to take your nausea medication as prescribed.    If you develop nausea and vomiting that is not controlled by your nausea medication, call the clinic.   BELOW ARE SYMPTOMS THAT SHOULD BE REPORTED IMMEDIATELY:  *FEVER GREATER THAN 100.5 F  *CHILLS WITH OR WITHOUT FEVER  NAUSEA AND VOMITING THAT IS NOT CONTROLLED WITH YOUR NAUSEA MEDICATION  *UNUSUAL SHORTNESS OF BREATH  *UNUSUAL BRUISING OR BLEEDING  TENDERNESS IN MOUTH AND THROAT WITH OR WITHOUT PRESENCE OF ULCERS  *URINARY PROBLEMS  *BOWEL PROBLEMS  UNUSUAL RASH Items with * indicate a potential emergency and should be followed up as soon as possible.  Feel free to call the clinic you have any questions or concerns. The clinic phone number is [phone].  Please show the CHEMO ALERT CARD at check-in to the Emergency Department and triage nurse.   

## 2014-11-03 NOTE — Progress Notes (Signed)
Patient Care Team: Leandrew Koyanagi, MD as PCP - General (Family Medicine) Fanny Skates, MD as Consulting Physician (General Surgery) Nicholas Lose, MD as Consulting Physician (Hematology and Oncology) Arloa Koh, MD as Consulting Physician (Radiation Oncology) Mauro Kaufmann, RN as Registered Nurse Rockwell Germany, RN as Registered Nurse Holley Bouche, NP as Nurse Practitioner (Nurse Practitioner)  DIAGNOSIS: Breast cancer of upper-outer quadrant of left female breast   Staging form: Breast, AJCC 7th Edition     Clinical stage from 09/14/2014: Stage IIB (T3, N0, M0) - Unsigned     Pathologic stage from 09/26/2014: Stage IIB (T3, N0, cM0) - Signed by Seward Grater, MD on 10/04/2014       Staging comments: Staged on final mastectomy by Dr. Avis Epley.    SUMMARY OF ONCOLOGIC HISTORY:   Breast cancer of upper-outer quadrant of left female breast   09/06/2014 Initial Diagnosis Left breast 3:00 biopsy: Invasive ductal carcinoma with abundant extracellular mucin, ER 97%, PR 98%, Ki-67 47%, HER-2 negative ratio 1.28   09/12/2014 Breast MRI Left breast: 6.5 x 5.3 x 5.1 cm irregular enhancing mass with 2 adjacent 8 mm satellite masses, no lymphadenopathy   09/23/2014 Surgery Left Mastectomy: IDC grade 2 with extracellular mucin 6.2 cm, 4 LN Neg, ER 97%, PR 98%, Her 2 Neg ratio 1.28, Ki 67 47% T3N0 Stage 2B   10/20/2014 -  Chemotherapy Adjuvant chemotherapy with dose since Adriamycin and Cytoxan 4 followed by Abraxane weekly 12    CHIEF COMPLIANT: cycle 2 AC  INTERVAL HISTORY: Amy [name] is a 64 year old with above-mentioned history of left-sided breast cancer and underwent mastectomy and is now on adjuvant chemotherapy. Today's cycle 2 of treatment. She felt well over the past week. Energy levels are good. She has good taste in her mouth. She clipped her hair relatively short. Denies any further nausea vomiting issues. She felt very tired after the first week of chemotherapy but now she  is feeling better.  REVIEW OF SYSTEMS:   Constitutional: Denies fevers, chills or abnormal weight loss Eyes: Denies blurriness of vision Ears, nose, mouth, throat, and face: Denies mucositis or sore throat Respiratory: Denies cough, dyspnea or wheezes Cardiovascular: Denies palpitation, chest discomfort or lower extremity swelling Gastrointestinal:  Denies nausea, heartburn or change in bowel habits Skin: Denies abnormal skin rashes Lymphatics: Denies new lymphadenopathy or easy bruising Neurological:Denies numbness, tingling or new weaknesses Behavioral/Psych: Mood is stable, no new changes  Breast:  denies any pain or lumps or nodules in either breasts All other systems were reviewed with the patient and are negative.  I have reviewed the past medical history, past surgical history, social history and family history with the patient and they are unchanged from previous note.  ALLERGIES:  is allergic to codeine; tramadol; darvocet; gold-containing drug products; and sulfa antibiotics.  MEDICATIONS:  Current Outpatient Prescriptions  Medication Sig Dispense Refill  . ALPRAZolam (XANAX) 0.25 MG tablet TAKE 1 TABLET TWICE A DAY AS NEEDED 60 tablet 0  . cetirizine (ZYRTEC) 10 MG tablet Take 10 mg by mouth daily.    . citalopram (CELEXA) 40 MG tablet TAKE 1 TABLET (40 MG TOTAL) BY MOUTH DAILY. 90 tablet 3  . dexamethasone (DECADRON) 4 MG tablet Take 2 tablets by mouth once a day on the day after chemotherapy and then take 2 tablets two times a day for 2 days. Take with food. 30 tablet 1  . HYDROcodone-acetaminophen (NORCO) 5-325 MG per tablet Take 1 tablet by mouth every 4 (four)  hours as needed. For pain    . lidocaine-prilocaine (EMLA) cream Apply to affected area once 30 g 3  . lisinopril (PRINIVIL,ZESTRIL) 5 MG tablet Take 1 tablet (5 mg total) by mouth daily. 90 tablet 3  . LORazepam (ATIVAN) 0.5 MG tablet Take 1 tablet (0.5 mg total) by mouth every 6 (six) hours as needed (Nausea or  vomiting). 30 tablet 0  . metoprolol succinate (TOPROL-XL) 100 MG 24 hr tablet Take 1 tablet (100 mg total) by mouth daily. Take with or immediately following a meal. 90 tablet 3  . ondansetron (ZOFRAN) 4 MG tablet Take 1 tablet (4 mg total) by mouth every 8 (eight) hours as needed for nausea. 20 tablet 0  . phenylephrine (SUDAFED PE) 10 MG TABS tablet Take 10 mg by mouth daily as needed (allergies).    . prochlorperazine (COMPAZINE) 10 MG tablet Take 1 tablet (10 mg total) by mouth every 6 (six) hours as needed (Nausea or vomiting). 30 tablet 1  . promethazine (PHENERGAN) 25 MG tablet Take 1 tablet (25 mg total) by mouth every 6 (six) hours as needed for nausea or vomiting. 30 tablet 1  . SUMAtriptan (IMITREX) 100 MG tablet Take 100 mg by mouth every 2 (two) hours as needed for migraine. May repeat in 2 hours if headache persists or recurs.    Marland Kitchen UNABLE TO FIND 1 each by Other route once. 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 verapamil (VERELAN PM) 180 MG 24 hr capsule TAKE 1 CAPSULE (180 MG TOTAL) BY MOUTH 2 (TWO) TIMES DAILY. 180 capsule 3  . zolpidem (AMBIEN) 10 MG tablet Take 1 tablet (10 mg total) by mouth at bedtime. 30 tablet 1  . ondansetron (ZOFRAN) 8 MG tablet Take 1 tablet (8 mg total) by mouth 2 (two) times daily as needed. Start on the third day after chemotherapy. (Patient not taking: Reported on 11/03/2014) 30 tablet 1   No current facility-administered medications for this visit.    PHYSICAL EXAMINATION: ECOG PERFORMANCE STATUS: 1 - Symptomatic but completely ambulatory  Filed Vitals:   11/03/14 0923  BP: 141/81  Pulse: 78  Temp: 97.9 F (36.6 C)  Resp: 20   Filed Weights   11/03/14 0923  Weight: 151 lb 6.4 oz (68.675 kg)    GENERAL:alert, no distress and comfortable SKIN: skin color, texture, turgor are normal, no rashes or significant lesions EYES: normal, Conjunctiva are pink and  non-injected, sclera clear OROPHARYNX:no exudate, no erythema and lips, buccal mucosa, and tongue normal  NECK: supple, thyroid normal size, non-tender, without nodularity LYMPH:  no palpable lymphadenopathy in the cervical, axillary or inguinal LUNGS: clear to auscultation and percussion with normal breathing effort HEART: regular rate & rhythm and no murmurs and no lower extremity edema ABDOMEN:abdomen soft, non-tender and normal bowel sounds Musculoskeletal:no cyanosis of digits and no clubbing  NEURO: alert & oriented x 3 with fluent speech, no focal motor/sensory deficits  LABORATORY DATA:  I have reviewed the data as listed   Chemistry      Component Value Date/Time   NA 138 11/03/2014 0906   NA 137 09/20/2014 1553   K 3.9 11/03/2014 0906   K 3.7 09/20/2014 1553   CL 104 09/20/2014 1553   CO2 26 11/03/2014 0906   CO2 21 09/20/2014 1553   BUN 8.1 11/03/2014 0906   BUN 8 09/20/2014 1553   CREATININE 0.7 11/03/2014 0906   CREATININE 0.68 09/23/2014 1920  CREATININE 0.56 08/16/2014 1208      Component Value Date/Time   CALCIUM 8.7 11/03/2014 0906   CALCIUM 9.1 09/20/2014 1553   ALKPHOS 63 11/03/2014 0906   ALKPHOS 34* 09/20/2014 1553   AST 12 11/03/2014 0906   AST 18 09/20/2014 1553   ALT 14 11/03/2014 0906   ALT 11 09/20/2014 1553   BILITOT <0.20 11/03/2014 0906   BILITOT 0.2* 09/20/2014 1553       Lab Results  Component Value Date   WBC 20.9* 11/03/2014   HGB 11.7 11/03/2014   HCT 35.8 11/03/2014   MCV 86.7 11/03/2014   PLT 260 11/03/2014   NEUTROABS 15.6* 11/03/2014   ASSESSMENT & PLAN:  Breast cancer of upper-outer quadrant of left female breast Left Mastectomy 09/23/14: IDC grade 2 with extracellular mucin 6.2 cm, 4 LN Neg, ER 97%, PR 98%, Her 2 Neg ratio 1.28, Ki 67 47% T3N0 Stage 2B  Treatment plan: adjuvant chemotherapy withDose dense Adriamycin and Cytoxan 4 followed by Abraxane weekly 12 followed by radiation and antiestrogen therapy  Current  treatment: Today cycle 2 day 1 dose dense Adriamycin and Cytoxan  Toxicities of chemotherapy: 1. Severe fatigue 2. Chemotherapy-induced neutropenia grade 4: Decreased the dose of cycle 2 of chemotherapy 3. Nausea which improved with Compazine. Zofran caused migraines. We will give her Phenergan in addition to Compazine for when necessary nausea treatment. 4. Intermittent diarrhea: Improved with Imodium.  Return to clinic in 2 weeks for second cycle. She would like to not get the Neulasta on body injector. She would like to receive the subcutaneous injection the day after chemotherapy. Because the on body injector makes her not fall asleep.     No orders of the defined types were placed in this encounter.   The patient has a good understanding of the overall plan. she agrees with it. She will call with any problems that may develop before her next visit here.   Rulon Eisenmenger, MD

## 2014-11-03 NOTE — Assessment & Plan Note (Signed)
Left Mastectomy 09/23/14: IDC grade 2 with extracellular mucin 6.2 cm, 4 LN Neg, ER 97%, PR 98%, Her 2 Neg ratio 1.28, Ki 67 47% T3N0 Stage 2B  Treatment plan: adjuvant chemotherapy withDose dense Adriamycin and Cytoxan 4 followed by Abraxane weekly 12 followed by radiation and antiestrogen therapy  Current treatment: Today cycle 2 day 1 dose dense Adriamycin and Cytoxan  Toxicities of chemotherapy: 1. Severe fatigue 2. Chemotherapy-induced neutropenia grade 4: Decreased the dose of cycle 2 of chemotherapy 3. Nausea which improved with Compazine. Zofran caused migraines. We will give her Phenergan in addition to Compazine for when necessary nausea treatment. 4. Intermittent diarrhea: Improved with Imodium.  Return to clinic in 2 weeks for second cycle. She would like to not get the Neulasta on body injector. She would like to receive the subcutaneous injection the day after chemotherapy. Because the on body injector makes her not fall asleep.

## 2014-11-03 NOTE — Progress Notes (Signed)
I briefly spoke with Amy [name] at her chemotherapy treatment today to introduce myself and our survivorship program.    We discussed the purpose of the Survivorship Clinic, which will include monitoring for recurrence, coordinating completion of age and gender-appropriate cancer screenings, promotion of overall wellness, as well as managing potential late/long-term side effects of anti-cancer treatments.    As of today, the intent of treatment for Amy [name] is cure, therefore she will be eligible for the Survivorship Clinic upon her completion of treatment.  Her survivorship care plan (SCP) document will be drafted and updated throughout the course of her treatment trajectory. She will receive the SCP in an office visit with myself in the Survivorship Clinic once she has completed treatment.   Amy [name] was encouraged to ask questions and all questions were answered to her satisfaction.  She was given my business card and encouraged to contact me with any concerns regarding survivorship.  I look forward to participating in her care.   Mike Craze, NP [place] [phone]

## 2014-11-03 NOTE — Telephone Encounter (Signed)
per pof sch to sch pt appt-sent MW email to sch trmt-pt to getupdate sch b4 leaving

## 2014-11-03 NOTE — Telephone Encounter (Signed)
Per staff message and POF I have scheduled appts. Advised scheduler of appts. JMW  

## 2014-11-03 NOTE — Telephone Encounter (Signed)
Gave avs & calendar. Patient already schedule.

## 2014-11-04 ENCOUNTER — Ambulatory Visit (HOSPITAL_BASED_OUTPATIENT_CLINIC_OR_DEPARTMENT_OTHER): Payer: BLUE CROSS/BLUE SHIELD

## 2014-11-04 ENCOUNTER — Other Ambulatory Visit: Payer: Self-pay | Admitting: *Deleted

## 2014-11-04 DIAGNOSIS — C50412 Malignant neoplasm of upper-outer quadrant of left female breast: Secondary | ICD-10-CM

## 2014-11-04 DIAGNOSIS — Z5189 Encounter for other specified aftercare: Secondary | ICD-10-CM | POA: Diagnosis not present

## 2014-11-04 DIAGNOSIS — C50812 Malignant neoplasm of overlapping sites of left female breast: Secondary | ICD-10-CM

## 2014-11-04 MED ORDER — MAGIC MOUTHWASH W/LIDOCAINE: ORAL | Status: DC

## 2014-11-04 MED ORDER — PEGFILGRASTIM INJECTION 6 MG/0.6ML ~~LOC~~
6.0000 mg | PREFILLED_SYRINGE | Freq: Once | SUBCUTANEOUS | Status: AC
  Administered 2014-11-04: 6 mg via SUBCUTANEOUS
  Filled 2014-11-04: qty 0.6

## 2014-11-04 NOTE — Telephone Encounter (Signed)
Received call from patient stating she has had some [employer] Magic Mouthwash in the past and she had used it last time after chemo.  She is requesting more to be called in. Rx for magic mouthwash called in to her pharmacy.

## 2014-11-07 ENCOUNTER — Telehealth: Payer: Self-pay | Admitting: *Deleted

## 2014-11-07 NOTE — Telephone Encounter (Signed)
Received report from [facility]. Rx for magic mouthwash. Sent to scan.

## 2014-11-07 NOTE — Telephone Encounter (Signed)
Called patient to follow-up from call to after hours service and received rx for magic mouthwash. LVMM for patient to call with any problems.

## 2014-11-15 NOTE — Telephone Encounter (Signed)
error 

## 2014-11-16 ENCOUNTER — Other Ambulatory Visit: Payer: Self-pay

## 2014-11-16 DIAGNOSIS — C50412 Malignant neoplasm of upper-outer quadrant of left female breast: Secondary | ICD-10-CM

## 2014-11-17 ENCOUNTER — Telehealth: Payer: Self-pay | Admitting: Hematology and Oncology

## 2014-11-17 ENCOUNTER — Other Ambulatory Visit (HOSPITAL_BASED_OUTPATIENT_CLINIC_OR_DEPARTMENT_OTHER): Payer: BLUE CROSS/BLUE SHIELD

## 2014-11-17 ENCOUNTER — Ambulatory Visit (HOSPITAL_BASED_OUTPATIENT_CLINIC_OR_DEPARTMENT_OTHER): Payer: BLUE CROSS/BLUE SHIELD

## 2014-11-17 ENCOUNTER — Ambulatory Visit (HOSPITAL_BASED_OUTPATIENT_CLINIC_OR_DEPARTMENT_OTHER): Payer: BLUE CROSS/BLUE SHIELD | Admitting: Hematology and Oncology

## 2014-11-17 VITALS — BP 124/75 | HR 78 | Temp 97.5°F | Resp 18 | Ht 66.0 in | Wt 153.4 lb

## 2014-11-17 DIAGNOSIS — C50412 Malignant neoplasm of upper-outer quadrant of left female breast: Secondary | ICD-10-CM

## 2014-11-17 DIAGNOSIS — C50812 Malignant neoplasm of overlapping sites of left female breast: Secondary | ICD-10-CM | POA: Diagnosis not present

## 2014-11-17 DIAGNOSIS — R197 Diarrhea, unspecified: Secondary | ICD-10-CM

## 2014-11-17 DIAGNOSIS — R110 Nausea: Secondary | ICD-10-CM

## 2014-11-17 DIAGNOSIS — D701 Agranulocytosis secondary to cancer chemotherapy: Secondary | ICD-10-CM | POA: Diagnosis not present

## 2014-11-17 DIAGNOSIS — Z5111 Encounter for antineoplastic chemotherapy: Secondary | ICD-10-CM | POA: Diagnosis not present

## 2014-11-17 DIAGNOSIS — R5383 Other fatigue: Secondary | ICD-10-CM

## 2014-11-17 LAB — CBC WITH DIFFERENTIAL/PLATELET
BASO%: 0.8 % (ref 0.0–2.0)
BASOS ABS: 0.2 10*3/uL — AB (ref 0.0–0.1)
EOS%: 0 % (ref 0.0–7.0)
Eosinophils Absolute: 0 10*3/uL (ref 0.0–0.5)
HCT: 35.1 % (ref 34.8–46.6)
HEMOGLOBIN: 11.6 g/dL (ref 11.6–15.9)
LYMPH#: 3.2 10*3/uL (ref 0.9–3.3)
LYMPH%: 14.6 % (ref 14.0–49.7)
MCH: 28.6 pg (ref 25.1–34.0)
MCHC: 33 g/dL (ref 31.5–36.0)
MCV: 86.7 fL (ref 79.5–101.0)
MONO#: 1.4 10*3/uL — ABNORMAL HIGH (ref 0.1–0.9)
MONO%: 6.5 % (ref 0.0–14.0)
NEUT#: 17.2 10*3/uL — ABNORMAL HIGH (ref 1.5–6.5)
NEUT%: 78.1 % — ABNORMAL HIGH (ref 38.4–76.8)
Platelets: 236 10*3/uL (ref 145–400)
RBC: 4.05 10*6/uL (ref 3.70–5.45)
RDW: 14.3 % (ref 11.2–14.5)
WBC: 22.1 10*3/uL — ABNORMAL HIGH (ref 3.9–10.3)

## 2014-11-17 LAB — COMPREHENSIVE METABOLIC PANEL (CC13)
ALK PHOS: 72 U/L (ref 40–150)
ALT: 14 U/L (ref 0–55)
AST: 13 U/L (ref 5–34)
Albumin: 3.7 g/dL (ref 3.5–5.0)
Anion Gap: 10 mEq/L (ref 3–11)
BUN: 6.4 mg/dL — AB (ref 7.0–26.0)
CALCIUM: 8.8 mg/dL (ref 8.4–10.4)
CHLORIDE: 105 meq/L (ref 98–109)
CO2: 24 mEq/L (ref 22–29)
CREATININE: 0.6 mg/dL (ref 0.6–1.1)
Glucose: 121 mg/dl (ref 70–140)
POTASSIUM: 3.8 meq/L (ref 3.5–5.1)
Sodium: 139 mEq/L (ref 136–145)
Total Bilirubin: 0.22 mg/dL (ref 0.20–1.20)
Total Protein: 6 g/dL — ABNORMAL LOW (ref 6.4–8.3)

## 2014-11-17 MED ORDER — SODIUM CHLORIDE 0.9 % IV SOLN
500.0000 mg/m2 | Freq: Once | INTRAVENOUS | Status: AC
  Administered 2014-11-17: 920 mg via INTRAVENOUS
  Filled 2014-11-17: qty 46

## 2014-11-17 MED ORDER — PALONOSETRON HCL INJECTION 0.25 MG/5ML
0.2500 mg | Freq: Once | INTRAVENOUS | Status: AC
  Administered 2014-11-17: 0.25 mg via INTRAVENOUS

## 2014-11-17 MED ORDER — SODIUM CHLORIDE 0.9 % IV SOLN
Freq: Once | INTRAVENOUS | Status: AC
  Administered 2014-11-17: 11:00:00 via INTRAVENOUS
  Filled 2014-11-17: qty 5

## 2014-11-17 MED ORDER — DOXORUBICIN HCL CHEMO IV INJECTION 2 MG/ML
50.0000 mg/m2 | Freq: Once | INTRAVENOUS | Status: AC
  Administered 2014-11-17: 92 mg via INTRAVENOUS
  Filled 2014-11-17: qty 46

## 2014-11-17 MED ORDER — SODIUM CHLORIDE 0.9 % IJ SOLN
10.0000 mL | INTRAMUSCULAR | Status: DC | PRN
  Administered 2014-11-17: 10 mL
  Filled 2014-11-17: qty 10

## 2014-11-17 MED ORDER — SODIUM CHLORIDE 0.9 % IV SOLN
Freq: Once | INTRAVENOUS | Status: AC
  Administered 2014-11-17: 10:00:00 via INTRAVENOUS

## 2014-11-17 MED ORDER — HEPARIN SOD (PORK) LOCK FLUSH 100 UNIT/ML IV SOLN
500.0000 [IU] | Freq: Once | INTRAVENOUS | Status: AC | PRN
  Administered 2014-11-17: 500 [IU]
  Filled 2014-11-17: qty 5

## 2014-11-17 MED ORDER — PALONOSETRON HCL INJECTION 0.25 MG/5ML
INTRAVENOUS | Status: AC
  Filled 2014-11-17: qty 5

## 2014-11-17 NOTE — Progress Notes (Signed)
Patient Care Team: Leandrew Koyanagi, MD as PCP - General (Family Medicine) Fanny Skates, MD as Consulting Physician (General Surgery) Nicholas Lose, MD as Consulting Physician (Hematology and Oncology) Arloa Koh, MD as Consulting Physician (Radiation Oncology) Mauro Kaufmann, RN as Registered Nurse Rockwell Germany, RN as Registered Nurse Holley Bouche, NP as Nurse Practitioner (Nurse Practitioner)  DIAGNOSIS: Breast cancer of upper-outer quadrant of left female breast   Staging form: Breast, AJCC 7th Edition     Clinical stage from 09/14/2014: Stage IIB (T3, N0, M0) - Unsigned     Pathologic stage from 09/26/2014: Stage IIB (T3, N0, cM0) - Signed by Seward Grater, MD on 10/04/2014       Staging comments: Staged on final mastectomy by Dr. Avis Epley.    SUMMARY OF ONCOLOGIC HISTORY:   Breast cancer of upper-outer quadrant of left female breast   09/06/2014 Initial Diagnosis Left breast 3:00 biopsy: Invasive ductal carcinoma with abundant extracellular mucin, ER 97%, PR 98%, Ki-67 47%, HER-2 negative ratio 1.28   09/12/2014 Breast MRI Left breast: 6.5 x 5.3 x 5.1 cm irregular enhancing mass with 2 adjacent 8 mm satellite masses, no lymphadenopathy   09/23/2014 Surgery Left Mastectomy: IDC grade 2 with extracellular mucin 6.2 cm, 4 LN Neg, ER 97%, PR 98%, Her 2 Neg ratio 1.28, Ki 67 47% T3N0 Stage 2B   10/20/2014 -  Chemotherapy Adjuvant chemotherapy with dose since Adriamycin and Cytoxan 4 followed by Abraxane weekly 12    CHIEF COMPLIANT: cycle 3 of Adriamycin and Cytoxan  INTERVAL HISTORY: Amy [name] is a 64 year old with above-mentioned history of left breast cancer currently on adjuvant chemotherapy. Today is cycle 3 of Adriamycin and Cytoxan. She had nausea which is well-controlled with Compazine and Ativan along with Phenergan. Denies any other new complaints. She's been drinking a lot of water and hence has to go to the bathroom with time. Complains of fatigue the week of  chemotherapy and then it gets better the week after.  REVIEW OF SYSTEMS:   Constitutional: Denies fevers, chills or abnormal weight loss Eyes: Denies blurriness of vision Ears, nose, mouth, throat, and face: Denies mucositis or sore throat Respiratory: Denies cough, dyspnea or wheezes Cardiovascular: Denies palpitation, chest discomfort or lower extremity swelling Gastrointestinal:  Denies nausea, heartburn or change in bowel habits Skin: Denies abnormal skin rashes Lymphatics: Denies new lymphadenopathy or easy bruising Neurological:Denies numbness, tingling or new weaknesses Behavioral/Psych: Mood is stable, no new changes  Breast:  denies any pain or lumps or nodules in either breasts All other systems were reviewed with the patient and are negative.  I have reviewed the past medical history, past surgical history, social history and family history with the patient and they are unchanged from previous note.  ALLERGIES:  is allergic to codeine; tramadol; darvocet; gold-containing drug products; and sulfa antibiotics.  MEDICATIONS:  Current Outpatient Prescriptions  Medication Sig Dispense Refill  . ALPRAZolam (XANAX) 0.25 MG tablet TAKE 1 TABLET TWICE A DAY AS NEEDED 60 tablet 0  . Alum & Mag Hydroxide-Simeth (MAGIC MOUTHWASH W/LIDOCAINE) SOLN 5-17m four times a day as needed for mouth sores. Swish, gargle, and spit. 240 mL 1  . cetirizine (ZYRTEC) 10 MG tablet Take 10 mg by mouth daily.    . citalopram (CELEXA) 40 MG tablet TAKE 1 TABLET (40 MG TOTAL) BY MOUTH DAILY. 90 tablet 3  . dexamethasone (DECADRON) 4 MG tablet Take 2 tablets by mouth once a day on the day after chemotherapy and then  take 2 tablets two times a day for 2 days. Take with food. 30 tablet 1  . HYDROcodone-acetaminophen (NORCO) 5-325 MG per tablet Take 1 tablet by mouth every 4 (four) hours as needed. For pain    . lidocaine-prilocaine (EMLA) cream Apply to affected area once 30 g 3  . lisinopril  (PRINIVIL,ZESTRIL) 5 MG tablet Take 1 tablet (5 mg total) by mouth daily. 90 tablet 3  . LORazepam (ATIVAN) 0.5 MG tablet Take 1 tablet (0.5 mg total) by mouth every 6 (six) hours as needed (Nausea or vomiting). 30 tablet 0  . metoprolol succinate (TOPROL-XL) 100 MG 24 hr tablet Take 1 tablet (100 mg total) by mouth daily. Take with or immediately following a meal. 90 tablet 3  . ondansetron (ZOFRAN) 4 MG tablet Take 1 tablet (4 mg total) by mouth every 8 (eight) hours as needed for nausea. 20 tablet 0  . ondansetron (ZOFRAN) 8 MG tablet Take 1 tablet (8 mg total) by mouth 2 (two) times daily as needed. Start on the third day after chemotherapy. 30 tablet 1  . phenylephrine (SUDAFED PE) 10 MG TABS tablet Take 10 mg by mouth daily as needed (allergies).    . prochlorperazine (COMPAZINE) 10 MG tablet Take 1 tablet (10 mg total) by mouth every 6 (six) hours as needed (Nausea or vomiting). 30 tablet 1  . promethazine (PHENERGAN) 25 MG tablet Take 1 tablet (25 mg total) by mouth every 6 (six) hours as needed for nausea or vomiting. 30 tablet 1  . SUMAtriptan (IMITREX) 100 MG tablet Take 100 mg by mouth every 2 (two) hours as needed for migraine. May repeat in 2 hours if headache persists or recurs.    Marland Kitchen UNABLE TO FIND 1 each by Other route once. 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 verapamil (VERELAN PM) 180 MG 24 hr capsule TAKE 1 CAPSULE (180 MG TOTAL) BY MOUTH 2 (TWO) TIMES DAILY. 180 capsule 3  . zolpidem (AMBIEN) 10 MG tablet Take 1 tablet (10 mg total) by mouth at bedtime. 30 tablet 1   No current facility-administered medications for this visit.    PHYSICAL EXAMINATION: ECOG PERFORMANCE STATUS: 1 - Symptomatic but completely ambulatory  Filed Vitals:   11/17/14 0837  BP: 124/75  Pulse: 78  Temp: 97.5 F (36.4 C)  Resp: 18   Filed Weights   11/17/14 0837  Weight: 153 lb 6.4 oz (69.582 kg)    GENERAL:alert,  no distress and comfortable SKIN: skin color, texture, turgor are normal, no rashes or significant lesions EYES: normal, Conjunctiva are pink and non-injected, sclera clear OROPHARYNX:no exudate, no erythema and lips, buccal mucosa, and tongue normal  NECK: supple, thyroid normal size, non-tender, without nodularity LYMPH:  no palpable lymphadenopathy in the cervical, axillary or inguinal LUNGS: clear to auscultation and percussion with normal breathing effort HEART: regular rate & rhythm and no murmurs and no lower extremity edema ABDOMEN:abdomen soft, non-tender and normal bowel sounds Musculoskeletal:no cyanosis of digits and no clubbing  NEURO: alert & oriented x 3 with fluent speech, no focal motor/sensory deficits  LABORATORY DATA:  I have reviewed the data as listed   Chemistry      Component Value Date/Time   NA 139 11/17/2014 0809   NA 137 09/20/2014 1553   K 3.8 11/17/2014 0809   K 3.7 09/20/2014 1553   CL 104 09/20/2014 1553   CO2 24 11/17/2014 0809   CO2 21  09/20/2014 1553   BUN 6.4* 11/17/2014 0809   BUN 8 09/20/2014 1553   CREATININE 0.6 11/17/2014 0809   CREATININE 0.68 09/23/2014 1920   CREATININE 0.56 08/16/2014 1208      Component Value Date/Time   CALCIUM 8.8 11/17/2014 0809   CALCIUM 9.1 09/20/2014 1553   ALKPHOS 72 11/17/2014 0809   ALKPHOS 34* 09/20/2014 1553   AST 13 11/17/2014 0809   AST 18 09/20/2014 1553   ALT 14 11/17/2014 0809   ALT 11 09/20/2014 1553   BILITOT 0.22 11/17/2014 0809   BILITOT 0.2* 09/20/2014 1553       Lab Results  Component Value Date   WBC 22.1* 11/17/2014   HGB 11.6 11/17/2014   HCT 35.1 11/17/2014   MCV 86.7 11/17/2014   PLT 236 11/17/2014   NEUTROABS 17.2* 11/17/2014    ASSESSMENT & PLAN:  Breast cancer of upper-outer quadrant of left female breast Left Mastectomy 09/23/14: IDC grade 2 with extracellular mucin 6.2 cm, 4 LN Neg, ER 97%, PR 98%, Her 2 Neg ratio 1.28, Ki 67 47% T3N0 Stage 2B  Treatment plan:  adjuvant chemotherapy withDose dense Adriamycin and Cytoxan 4 followed by Abraxane weekly 12 followed by radiation and antiestrogen therapy  Current treatment: Today cycle 3 day 1 dose dense Adriamycin and Cytoxan  Toxicities of chemotherapy: 1. Severe fatigue 2. Chemotherapy-induced neutropenia grade 4: Decreased the dose of cycle 2 of chemotherapy 3. Nausea which improved with Compazine. Zofran caused migraines. We gave her Phenergan in addition to Compazine for when necessary nausea treatment. 4. Intermittent diarrhea: Improved with Imodium.  Return to clinic in 2 weeks for cycle 3. She would like to not get the Neulasta on body injector. She would like to receive the subcutaneous injection the day after chemotherapy. Because the on body injector makes it difficult to sleep.     Orders Placed This Encounter  Procedures  . CBC with Differential    Standing Status: Future     Number of Occurrences:      Standing Expiration Date: 11/17/2015  . Comprehensive metabolic panel (Cmet) - [facility]    Standing Status: Future     Number of Occurrences:      Standing Expiration Date: 11/17/2015   The patient has a good understanding of the overall plan. she agrees with it. She will call with any problems that may develop before her next visit here.   Rulon Eisenmenger, MD

## 2014-11-17 NOTE — Patient Instructions (Signed)
[facility] Discharge Instructions for Patients Receiving Chemotherapy  Today you received the following chemotherapy agents Adriamycin, Cytoxan.  To help prevent nausea and vomiting after your treatment, we encourage you to take your nausea medication ativan 0.5 mg, zofran 8 mg, compazine 10 mg, phenergan 25 mg as ordered by physician. If you develop nausea and vomiting that is not controlled by your nausea medication, call the clinic.   BELOW ARE SYMPTOMS THAT SHOULD BE REPORTED IMMEDIATELY:  *FEVER GREATER THAN 100.5 F  *CHILLS WITH OR WITHOUT FEVER  NAUSEA AND VOMITING THAT IS NOT CONTROLLED WITH YOUR NAUSEA MEDICATION  *UNUSUAL SHORTNESS OF BREATH  *UNUSUAL BRUISING OR BLEEDING  TENDERNESS IN MOUTH AND THROAT WITH OR WITHOUT PRESENCE OF ULCERS  *URINARY PROBLEMS  *BOWEL PROBLEMS  UNUSUAL RASH Items with * indicate a potential emergency and should be followed up as soon as possible.  Feel free to call the clinic you have any questions or concerns. The clinic phone number is [phone].  Please show the [place] at check-in to the Emergency Department and triage nurse.

## 2014-11-17 NOTE — Telephone Encounter (Signed)
Appointments made and avs printed for patient,double book per dr Loleta Dicker

## 2014-11-17 NOTE — Assessment & Plan Note (Signed)
Left Mastectomy 09/23/14: IDC grade 2 with extracellular mucin 6.2 cm, 4 LN Neg, ER 97%, PR 98%, Her 2 Neg ratio 1.28, Ki 67 47% T3N0 Stage 2B  Treatment plan: adjuvant chemotherapy withDose dense Adriamycin and Cytoxan 4 followed by Abraxane weekly 12 followed by radiation and antiestrogen therapy  Current treatment: Today cycle 3 day 1 dose dense Adriamycin and Cytoxan  Toxicities of chemotherapy: 1. Severe fatigue 2. Chemotherapy-induced neutropenia grade 4: Decreased the dose of cycle 2 of chemotherapy 3. Nausea which improved with Compazine. Zofran caused migraines. We gave her Phenergan in addition to Compazine for when necessary nausea treatment. 4. Intermittent diarrhea: Improved with Imodium.  Return to clinic in 2 weeks for cycle 3. She would like to not get the Neulasta on body injector. She would like to receive the subcutaneous injection the day after chemotherapy. Because the on body injector makes it difficult to sleep.

## 2014-11-18 ENCOUNTER — Ambulatory Visit (HOSPITAL_BASED_OUTPATIENT_CLINIC_OR_DEPARTMENT_OTHER): Payer: BLUE CROSS/BLUE SHIELD

## 2014-11-18 DIAGNOSIS — Z5189 Encounter for other specified aftercare: Secondary | ICD-10-CM

## 2014-11-18 DIAGNOSIS — C50412 Malignant neoplasm of upper-outer quadrant of left female breast: Secondary | ICD-10-CM

## 2014-11-18 DIAGNOSIS — C50812 Malignant neoplasm of overlapping sites of left female breast: Secondary | ICD-10-CM | POA: Diagnosis not present

## 2014-11-18 MED ORDER — PEGFILGRASTIM INJECTION 6 MG/0.6ML ~~LOC~~
6.0000 mg | PREFILLED_SYRINGE | Freq: Once | SUBCUTANEOUS | Status: AC
  Administered 2014-11-18: 6 mg via SUBCUTANEOUS
  Filled 2014-11-18: qty 0.6

## 2014-11-22 ENCOUNTER — Other Ambulatory Visit: Payer: Self-pay | Admitting: Internal Medicine

## 2014-11-22 ENCOUNTER — Other Ambulatory Visit: Payer: Self-pay | Admitting: Physician Assistant

## 2014-11-23 NOTE — Telephone Encounter (Signed)
Meds ordered this encounter  Medications  . zolpidem (AMBIEN) 10 MG tablet    Sig: TAKE 1 TABLET AT BEDTIME    Dispense:  30 tablet    Refill:  0    Not to exceed 4 additional fills before 03/26/2015

## 2014-11-23 NOTE — Telephone Encounter (Signed)
Meds ordered this encounter  Medications  . ALPRAZolam (XANAX) 0.25 MG tablet    Sig: TAKE 1 TABLET TWICE A DAY AS NEEDED    Dispense:  60 tablet    Refill:  0    Not to exceed 5 additional fills before 04/15/2015

## 2014-11-24 NOTE — Telephone Encounter (Signed)
Both rx's faxed.

## 2014-11-30 NOTE — Assessment & Plan Note (Signed)
Left Mastectomy 09/23/14: IDC grade 2 with extracellular mucin 6.2 cm, 4 LN Neg, ER 97%, PR 98%, Her 2 Neg ratio 1.28, Ki 67 47% T3N0 Stage 2B  Treatment plan: adjuvant chemotherapy withDose dense Adriamycin and Cytoxan 4 followed by Abraxane weekly 12 followed by radiation and antiestrogen therapy  Current treatment: Today cycle 4 day 1 dose dense Adriamycin and Cytoxan  Toxicities of chemotherapy: 1. Severe fatigue 2. Chemotherapy-induced neutropenia grade 4: Decreased the dose of cycle 2 of chemotherapy 3. Nausea which improved with Compazine. Zofran caused migraines. We gave her Phenergan in addition to Compazine for when necessary nausea treatment. 4. Intermittent diarrhea: Improved with Imodium.  Return to clinic in 2 weeks for cycle 1/12 Abraxane.

## 2014-12-01 ENCOUNTER — Other Ambulatory Visit (HOSPITAL_BASED_OUTPATIENT_CLINIC_OR_DEPARTMENT_OTHER): Payer: BLUE CROSS/BLUE SHIELD

## 2014-12-01 ENCOUNTER — Telehealth: Payer: Self-pay | Admitting: Hematology and Oncology

## 2014-12-01 ENCOUNTER — Ambulatory Visit (HOSPITAL_BASED_OUTPATIENT_CLINIC_OR_DEPARTMENT_OTHER): Payer: BLUE CROSS/BLUE SHIELD

## 2014-12-01 ENCOUNTER — Ambulatory Visit (HOSPITAL_BASED_OUTPATIENT_CLINIC_OR_DEPARTMENT_OTHER): Payer: BLUE CROSS/BLUE SHIELD | Admitting: Hematology and Oncology

## 2014-12-01 VITALS — BP 131/78 | HR 83 | Temp 98.1°F | Resp 18 | Ht 66.0 in | Wt 153.9 lb

## 2014-12-01 DIAGNOSIS — R5383 Other fatigue: Secondary | ICD-10-CM

## 2014-12-01 DIAGNOSIS — D701 Agranulocytosis secondary to cancer chemotherapy: Secondary | ICD-10-CM | POA: Diagnosis not present

## 2014-12-01 DIAGNOSIS — C50812 Malignant neoplasm of overlapping sites of left female breast: Secondary | ICD-10-CM

## 2014-12-01 DIAGNOSIS — R197 Diarrhea, unspecified: Secondary | ICD-10-CM | POA: Diagnosis not present

## 2014-12-01 DIAGNOSIS — Z5111 Encounter for antineoplastic chemotherapy: Secondary | ICD-10-CM | POA: Diagnosis not present

## 2014-12-01 DIAGNOSIS — C50412 Malignant neoplasm of upper-outer quadrant of left female breast: Secondary | ICD-10-CM

## 2014-12-01 DIAGNOSIS — D649 Anemia, unspecified: Secondary | ICD-10-CM | POA: Diagnosis not present

## 2014-12-01 DIAGNOSIS — R110 Nausea: Secondary | ICD-10-CM

## 2014-12-01 LAB — COMPREHENSIVE METABOLIC PANEL (CC13)
ALT: 13 U/L (ref 0–55)
ANION GAP: 10 meq/L (ref 3–11)
AST: 14 U/L (ref 5–34)
Albumin: 3.8 g/dL (ref 3.5–5.0)
Alkaline Phosphatase: 76 U/L (ref 40–150)
BUN: 6.1 mg/dL — ABNORMAL LOW (ref 7.0–26.0)
CALCIUM: 9 mg/dL (ref 8.4–10.4)
CO2: 22 mEq/L (ref 22–29)
Chloride: 107 mEq/L (ref 98–109)
Creatinine: 0.6 mg/dL (ref 0.6–1.1)
EGFR: >90 mL/min/{1.73_m2} (ref >90)
GLUCOSE: 122 mg/dL (ref 70–140)
POTASSIUM: 3.9 meq/L (ref 3.5–5.1)
Sodium: 139 mEq/L (ref 136–145)
TOTAL PROTEIN: 6.2 g/dL — AB (ref 6.4–8.3)
Total Bilirubin: 0.22 mg/dL (ref 0.20–1.20)

## 2014-12-01 LAB — CBC WITH DIFFERENTIAL/PLATELET
BASO%: 0.6 % (ref 0.0–2.0)
BASOS ABS: 0.1 10*3/uL (ref 0.0–0.1)
EOS ABS: 0 10*3/uL (ref 0.0–0.5)
EOS%: 0.1 % (ref 0.0–7.0)
HCT: 34 % — ABNORMAL LOW (ref 34.8–46.6)
HEMOGLOBIN: 11.1 g/dL — AB (ref 11.6–15.9)
LYMPH%: 11.9 % — AB (ref 14.0–49.7)
MCH: 28.8 pg (ref 25.1–34.0)
MCHC: 32.6 g/dL (ref 31.5–36.0)
MCV: 88.3 fL (ref 79.5–101.0)
MONO#: 1.3 10*3/uL — ABNORMAL HIGH (ref 0.1–0.9)
MONO%: 5.8 % (ref 0.0–14.0)
NEUT#: 18.8 10*3/uL — ABNORMAL HIGH (ref 1.5–6.5)
NEUT%: 81.6 % — ABNORMAL HIGH (ref 38.4–76.8)
PLATELETS: 249 10*3/uL (ref 145–400)
RBC: 3.85 10*6/uL (ref 3.70–5.45)
RDW: 16.4 % — ABNORMAL HIGH (ref 11.2–14.5)
WBC: 23.1 10*3/uL — ABNORMAL HIGH (ref 3.9–10.3)
lymph#: 2.8 10*3/uL (ref 0.9–3.3)

## 2014-12-01 MED ORDER — DOXORUBICIN HCL CHEMO IV INJECTION 2 MG/ML
50.0000 mg/m2 | Freq: Once | INTRAVENOUS | Status: AC
  Administered 2014-12-01: 92 mg via INTRAVENOUS
  Filled 2014-12-01: qty 46

## 2014-12-01 MED ORDER — HEPARIN SOD (PORK) LOCK FLUSH 100 UNIT/ML IV SOLN
500.0000 [IU] | Freq: Once | INTRAVENOUS | Status: AC | PRN
  Administered 2014-12-01: 500 [IU]
  Filled 2014-12-01: qty 5

## 2014-12-01 MED ORDER — PALONOSETRON HCL INJECTION 0.25 MG/5ML
INTRAVENOUS | Status: AC
  Filled 2014-12-01: qty 5

## 2014-12-01 MED ORDER — SODIUM CHLORIDE 0.9 % IV SOLN
500.0000 mg/m2 | Freq: Once | INTRAVENOUS | Status: AC
  Administered 2014-12-01: 920 mg via INTRAVENOUS
  Filled 2014-12-01: qty 46

## 2014-12-01 MED ORDER — SODIUM CHLORIDE 0.9 % IJ SOLN
10.0000 mL | INTRAMUSCULAR | Status: DC | PRN
  Administered 2014-12-01: 10 mL
  Filled 2014-12-01: qty 10

## 2014-12-01 MED ORDER — PALONOSETRON HCL INJECTION 0.25 MG/5ML
0.2500 mg | Freq: Once | INTRAVENOUS | Status: AC
  Administered 2014-12-01: 0.25 mg via INTRAVENOUS

## 2014-12-01 MED ORDER — SODIUM CHLORIDE 0.9 % IV SOLN
Freq: Once | INTRAVENOUS | Status: AC
  Administered 2014-12-01: 13:00:00 via INTRAVENOUS

## 2014-12-01 MED ORDER — FOSAPREPITANT DIMEGLUMINE INJECTION 150 MG
Freq: Once | INTRAVENOUS | Status: AC
  Administered 2014-12-01: 13:00:00 via INTRAVENOUS
  Filled 2014-12-01: qty 5

## 2014-12-01 NOTE — Telephone Encounter (Signed)
per pof ot [facility] pt appt-gave pt copy of sch

## 2014-12-01 NOTE — Progress Notes (Signed)
Patient Care Team: Leandrew Koyanagi, MD as PCP - General (Family Medicine) Fanny Skates, MD as Consulting Physician (General Surgery) Nicholas Lose, MD as Consulting Physician (Hematology and Oncology) Arloa Koh, MD as Consulting Physician (Radiation Oncology) Mauro Kaufmann, RN as Registered Nurse Rockwell Germany, RN as Registered Nurse Holley Bouche, NP as Nurse Practitioner (Nurse Practitioner)  DIAGNOSIS: Breast cancer of upper-outer quadrant of left female breast   Staging form: Breast, AJCC 7th Edition     Clinical stage from 09/14/2014: Stage IIB (T3, N0, M0) - Unsigned     Pathologic stage from 09/26/2014: Stage IIB (T3, N0, cM0) - Signed by Seward Grater, MD on 10/04/2014       Staging comments: Staged on final mastectomy by Dr. Avis Epley.    SUMMARY OF ONCOLOGIC HISTORY:   Breast cancer of upper-outer quadrant of left female breast   09/06/2014 Initial Diagnosis Left breast 3:00 biopsy: Invasive ductal carcinoma with abundant extracellular mucin, ER 97%, PR 98%, Ki-67 47%, HER-2 negative ratio 1.28   09/12/2014 Breast MRI Left breast: 6.5 x 5.3 x 5.1 cm irregular enhancing mass with 2 adjacent 8 mm satellite masses, no lymphadenopathy   09/23/2014 Surgery Left Mastectomy: IDC grade 2 with extracellular mucin 6.2 cm, 4 LN Neg, ER 97%, PR 98%, Her 2 Neg ratio 1.28, Ki 67 47% T3N0 Stage 2B   10/20/2014 -  Chemotherapy Adjuvant chemotherapy with dose since Adriamycin and Cytoxan 4 followed by Abraxane weekly 12    CHIEF COMPLIANT: cycle 4 of Adriamycin and Cytoxan  INTERVAL HISTORY: Amy [name] is a 64 year old with above-mentioned history of left-sided breast cancer currently on adjuvant chemotherapy. Today's cycle 4 of Adriamycin and Cytoxan and she appears to be tolerating chemotherapy fairly well. She has fatigue and nausea for 1-2 days after chemotherapy and all of these symptoms recover and she is back to her baseline. She has been doing all activities outside has  recent appetite and drinking lots of liquids.  REVIEW OF SYSTEMS:   Constitutional: Denies fevers, chills or abnormal weight loss Eyes: Denies blurriness of vision Ears, nose, mouth, throat, and face: Denies mucositis or sore throat Respiratory: Denies cough, dyspnea or wheezes Cardiovascular: Denies palpitation, chest discomfort or lower extremity swelling Gastrointestinal:  Denies nausea, heartburn or change in bowel habits Skin: Denies abnormal skin rashes Lymphatics: Denies new lymphadenopathy or easy bruising Neurological:Denies numbness, tingling or new weaknesses Behavioral/Psych: Mood is stable, no new changes  Breast:  denies any pain or lumps or nodules in either breasts All other systems were reviewed with the patient and are negative.  I have reviewed the past medical history, past surgical history, social history and family history with the patient and they are unchanged from previous note.  ALLERGIES:  is allergic to codeine; tramadol; darvocet; gold-containing drug products; and sulfa antibiotics.  MEDICATIONS:  Current Outpatient Prescriptions  Medication Sig Dispense Refill  . ALPRAZolam (XANAX) 0.25 MG tablet TAKE 1 TABLET TWICE A DAY AS NEEDED 60 tablet 0  . Alum & Mag Hydroxide-Simeth (MAGIC MOUTHWASH W/LIDOCAINE) SOLN 5-29m four times a day as needed for mouth sores. Swish, gargle, and spit. 240 mL 1  . cetirizine (ZYRTEC) 10 MG tablet Take 10 mg by mouth daily.    . citalopram (CELEXA) 40 MG tablet TAKE 1 TABLET (40 MG TOTAL) BY MOUTH DAILY. 90 tablet 3  . dexamethasone (DECADRON) 4 MG tablet Take 2 tablets by mouth once a day on the day after chemotherapy and then take 2 tablets two  times a day for 2 days. Take with food. 30 tablet 1  . HYDROcodone-acetaminophen (NORCO) 5-325 MG per tablet Take 1 tablet by mouth every 4 (four) hours as needed. For pain    . lidocaine-prilocaine (EMLA) cream Apply to affected area once 30 g 3  . lisinopril (PRINIVIL,ZESTRIL) 5 MG  tablet Take 1 tablet (5 mg total) by mouth daily. 90 tablet 3  . LORazepam (ATIVAN) 0.5 MG tablet Take 1 tablet (0.5 mg total) by mouth every 6 (six) hours as needed (Nausea or vomiting). 30 tablet 0  . metoprolol succinate (TOPROL-XL) 100 MG 24 hr tablet Take 1 tablet (100 mg total) by mouth daily. Take with or immediately following a meal. 90 tablet 3  . ondansetron (ZOFRAN) 4 MG tablet Take 1 tablet (4 mg total) by mouth every 8 (eight) hours as needed for nausea. 20 tablet 0  . ondansetron (ZOFRAN) 8 MG tablet Take 1 tablet (8 mg total) by mouth 2 (two) times daily as needed. Start on the third day after chemotherapy. 30 tablet 1  . phenylephrine (SUDAFED PE) 10 MG TABS tablet Take 10 mg by mouth daily as needed (allergies).    . prochlorperazine (COMPAZINE) 10 MG tablet Take 1 tablet (10 mg total) by mouth every 6 (six) hours as needed (Nausea or vomiting). 30 tablet 1  . promethazine (PHENERGAN) 25 MG tablet Take 1 tablet (25 mg total) by mouth every 6 (six) hours as needed for nausea or vomiting. 30 tablet 1  . SUMAtriptan (IMITREX) 100 MG tablet Take 100 mg by mouth every 2 (two) hours as needed for migraine. May repeat in 2 hours if headache persists or recurs.    Marland Kitchen UNABLE TO FIND 1 each by Other route once. 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 verapamil (VERELAN PM) 180 MG 24 hr capsule TAKE 1 CAPSULE (180 MG TOTAL) BY MOUTH 2 (TWO) TIMES DAILY. 180 capsule 3  . zolpidem (AMBIEN) 10 MG tablet TAKE 1 TABLET AT BEDTIME 30 tablet 0   No current facility-administered medications for this visit.    PHYSICAL EXAMINATION: ECOG PERFORMANCE STATUS: 0 - Asymptomatic  Filed Vitals:   12/01/14 1136  BP: 131/78  Pulse: 83  Temp: 98.1 F (36.7 C)  Resp: 18   Filed Weights   12/01/14 1136  Weight: 153 lb 14.4 oz (69.809 kg)    GENERAL:alert, no distress and comfortable SKIN: skin color, texture, turgor are  normal, no rashes or significant lesions EYES: normal, Conjunctiva are pink and non-injected, sclera clear OROPHARYNX:no exudate, no erythema and lips, buccal mucosa, and tongue normal  NECK: supple, thyroid normal size, non-tender, without nodularity LYMPH:  no palpable lymphadenopathy in the cervical, axillary or inguinal LUNGS: clear to auscultation and percussion with normal breathing effort HEART: regular rate & rhythm and no murmurs and no lower extremity edema ABDOMEN:abdomen soft, non-tender and normal bowel sounds Musculoskeletal:no cyanosis of digits and no clubbing  NEURO: alert & oriented x 3 with fluent speech, no focal motor/sensory deficits  LABORATORY DATA:  I have reviewed the data as listed   Chemistry      Component Value Date/Time   NA 139 12/01/2014 1123   NA 137 09/20/2014 1553   K 3.9 12/01/2014 1123   K 3.7 09/20/2014 1553   CL 104 09/20/2014 1553   CO2 22 12/01/2014 1123   CO2 21 09/20/2014 1553   BUN 6.1* 12/01/2014 1123   BUN 8  09/20/2014 1553   CREATININE 0.6 12/01/2014 1123   CREATININE 0.68 09/23/2014 1920   CREATININE 0.56 08/16/2014 1208      Component Value Date/Time   CALCIUM 9.0 12/01/2014 1123   CALCIUM 9.1 09/20/2014 1553   ALKPHOS 76 12/01/2014 1123   ALKPHOS 34* 09/20/2014 1553   AST 14 12/01/2014 1123   AST 18 09/20/2014 1553   ALT 13 12/01/2014 1123   ALT 11 09/20/2014 1553   BILITOT 0.22 12/01/2014 1123   BILITOT 0.2* 09/20/2014 1553       Lab Results  Component Value Date   WBC 23.1* 12/01/2014   HGB 11.1* 12/01/2014   HCT 34.0* 12/01/2014   MCV 88.3 12/01/2014   PLT 249 12/01/2014   NEUTROABS 18.8* 12/01/2014   ASSESSMENT & PLAN:  Breast cancer of upper-outer quadrant of left female breast Left Mastectomy 09/23/14: IDC grade 2 with extracellular mucin 6.2 cm, 4 LN Neg, ER 97%, PR 98%, Her 2 Neg ratio 1.28, Ki 67 47% T3N0 Stage 2B  Treatment plan: adjuvant chemotherapy withDose dense Adriamycin and Cytoxan 4 followed  by Abraxane weekly 12 followed by radiation and antiestrogen therapy  Current treatment: Today cycle 4 day 1 dose dense Adriamycin and Cytoxan  Toxicities of chemotherapy: 1. Severe fatigue 2. Chemotherapy-induced neutropenia grade 4: Decreased the dose of cycle 2 of chemotherapy 3. Nausea which improved with Compazine. Zofran caused migraines. We gave her Phenergan in addition to Compazine for when necessary nausea treatment. 4. Intermittent diarrhea: Improved with Imodium. 5. Anemia grade 1: Can be observed  Return to clinic in 2 weeks for cycle 1/12 Abraxane.   No orders of the defined types were placed in this encounter.   The patient has a good understanding of the overall plan. she agrees with it. She will call with any problems that may develop before her next visit here.   Rulon Eisenmenger, MD

## 2014-12-01 NOTE — Telephone Encounter (Signed)
Appointments made and avs will be printed and inj appointment 4/22

## 2014-12-01 NOTE — Patient Instructions (Signed)
[facility] Discharge Instructions for Patients Receiving Chemotherapy  Today you received the following chemotherapy agents Adriamycin, Cytoxan.  To help prevent nausea and vomiting after your treatment, we encourage you to take your nausea medication ativan 0.5 mg, zofran 8 mg, compazine 10 mg, phenergan 25 mg as ordered by physician. If you develop nausea and vomiting that is not controlled by your nausea medication, call the clinic.   BELOW ARE SYMPTOMS THAT SHOULD BE REPORTED IMMEDIATELY:  *FEVER GREATER THAN 100.5 F  *CHILLS WITH OR WITHOUT FEVER  NAUSEA AND VOMITING THAT IS NOT CONTROLLED WITH YOUR NAUSEA MEDICATION  *UNUSUAL SHORTNESS OF BREATH  *UNUSUAL BRUISING OR BLEEDING  TENDERNESS IN MOUTH AND THROAT WITH OR WITHOUT PRESENCE OF ULCERS  *URINARY PROBLEMS  *BOWEL PROBLEMS  UNUSUAL RASH Items with * indicate a potential emergency and should be followed up as soon as possible.  Feel free to call the clinic you have any questions or concerns. The clinic phone number is [phone].  Please show the [place] at check-in to the Emergency Department and triage nurse.

## 2014-12-02 ENCOUNTER — Ambulatory Visit (HOSPITAL_BASED_OUTPATIENT_CLINIC_OR_DEPARTMENT_OTHER): Payer: BLUE CROSS/BLUE SHIELD

## 2014-12-02 VITALS — BP 144/93 | HR 101 | Temp 98.3°F

## 2014-12-02 DIAGNOSIS — C50412 Malignant neoplasm of upper-outer quadrant of left female breast: Secondary | ICD-10-CM

## 2014-12-02 DIAGNOSIS — C50812 Malignant neoplasm of overlapping sites of left female breast: Secondary | ICD-10-CM

## 2014-12-02 DIAGNOSIS — Z5189 Encounter for other specified aftercare: Secondary | ICD-10-CM | POA: Diagnosis not present

## 2014-12-02 MED ORDER — PEGFILGRASTIM INJECTION 6 MG/0.6ML ~~LOC~~
6.0000 mg | PREFILLED_SYRINGE | Freq: Once | SUBCUTANEOUS | Status: AC
  Administered 2014-12-02: 6 mg via SUBCUTANEOUS
  Filled 2014-12-02: qty 0.6

## 2014-12-13 ENCOUNTER — Other Ambulatory Visit: Payer: Self-pay | Admitting: Hematology and Oncology

## 2014-12-14 NOTE — Assessment & Plan Note (Signed)
Left Mastectomy 09/23/14: IDC grade 2 with extracellular mucin 6.2 cm, 4 LN Neg, ER 97%, PR 98%, Her 2 Neg ratio 1.28, Ki 67 47% T3N0 Stage 2B  Treatment plan: adjuvant chemotherapy withDose dense Adriamycin and Cytoxan 4 followed by Abraxane weekly 12 followed by radiation and antiestrogen therapy  Current treatment: Today cycle 1/12 Abraxane. Toxicities of chemotherapy: 1. Severe fatigue 2. Chemotherapy-induced neutropenia grade 4: Decreased the dose of cycle 2 of chemotherapy 3. Nausea which improved with Compazine. Zofran caused migraines. We gave her Phenergan in addition to Compazine for when necessary nausea treatment. 4. Intermittent diarrhea: Improved with Imodium.  Return to clinic in 2 weeks for cycle 3/12 Abraxane.

## 2014-12-15 ENCOUNTER — Telehealth: Payer: Self-pay | Admitting: Hematology and Oncology

## 2014-12-15 ENCOUNTER — Ambulatory Visit (HOSPITAL_BASED_OUTPATIENT_CLINIC_OR_DEPARTMENT_OTHER): Payer: BLUE CROSS/BLUE SHIELD

## 2014-12-15 ENCOUNTER — Other Ambulatory Visit (HOSPITAL_BASED_OUTPATIENT_CLINIC_OR_DEPARTMENT_OTHER): Payer: BLUE CROSS/BLUE SHIELD

## 2014-12-15 ENCOUNTER — Other Ambulatory Visit: Payer: Self-pay | Admitting: *Deleted

## 2014-12-15 ENCOUNTER — Ambulatory Visit (HOSPITAL_BASED_OUTPATIENT_CLINIC_OR_DEPARTMENT_OTHER): Payer: BLUE CROSS/BLUE SHIELD | Admitting: Hematology and Oncology

## 2014-12-15 VITALS — BP 137/76 | HR 80 | Temp 97.9°F | Resp 18 | Ht 66.0 in | Wt 155.6 lb

## 2014-12-15 DIAGNOSIS — C50812 Malignant neoplasm of overlapping sites of left female breast: Secondary | ICD-10-CM

## 2014-12-15 DIAGNOSIS — Z5111 Encounter for antineoplastic chemotherapy: Secondary | ICD-10-CM | POA: Diagnosis not present

## 2014-12-15 DIAGNOSIS — R110 Nausea: Secondary | ICD-10-CM | POA: Diagnosis not present

## 2014-12-15 DIAGNOSIS — D72829 Elevated white blood cell count, unspecified: Secondary | ICD-10-CM | POA: Diagnosis not present

## 2014-12-15 DIAGNOSIS — C50412 Malignant neoplasm of upper-outer quadrant of left female breast: Secondary | ICD-10-CM

## 2014-12-15 DIAGNOSIS — D701 Agranulocytosis secondary to cancer chemotherapy: Secondary | ICD-10-CM | POA: Diagnosis not present

## 2014-12-15 DIAGNOSIS — R197 Diarrhea, unspecified: Secondary | ICD-10-CM

## 2014-12-15 LAB — COMPREHENSIVE METABOLIC PANEL (CC13)
ALT: 16 U/L (ref 0–55)
AST: 12 U/L (ref 5–34)
Albumin: 3.8 g/dL (ref 3.5–5.0)
Alkaline Phosphatase: 62 U/L (ref 40–150)
Anion Gap: 11 mEq/L (ref 3–11)
BUN: 6.9 mg/dL — ABNORMAL LOW (ref 7.0–26.0)
CHLORIDE: 106 meq/L (ref 98–109)
CO2: 25 meq/L (ref 22–29)
CREATININE: 0.7 mg/dL (ref 0.6–1.1)
Calcium: 8.7 mg/dL (ref 8.4–10.4)
EGFR: >90 mL/min/{1.73_m2} (ref >90)
Glucose: 123 mg/dl (ref 70–140)
Potassium: 4 mEq/L (ref 3.5–5.1)
Sodium: 141 mEq/L (ref 136–145)
Total Bilirubin: 0.25 mg/dL (ref 0.20–1.20)
Total Protein: 6.1 g/dL — ABNORMAL LOW (ref 6.4–8.3)

## 2014-12-15 LAB — CBC WITH DIFFERENTIAL/PLATELET
BASO%: 0.6 % (ref 0.0–2.0)
Basophils Absolute: 0.1 10*3/uL (ref 0.0–0.1)
EOS%: 0.2 % (ref 0.0–7.0)
Eosinophils Absolute: 0 10*3/uL (ref 0.0–0.5)
HCT: 33.1 % — ABNORMAL LOW (ref 34.8–46.6)
HGB: 10.6 g/dL — ABNORMAL LOW (ref 11.6–15.9)
LYMPH#: 2.7 10*3/uL (ref 0.9–3.3)
LYMPH%: 11.4 % — ABNORMAL LOW (ref 14.0–49.7)
MCH: 28.8 pg (ref 25.1–34.0)
MCHC: 32 g/dL (ref 31.5–36.0)
MCV: 89.9 fL (ref 79.5–101.0)
MONO#: 1.5 10*3/uL — ABNORMAL HIGH (ref 0.1–0.9)
MONO%: 6.5 % (ref 0.0–14.0)
NEUT%: 81.3 % — AB (ref 38.4–76.8)
NEUTROS ABS: 19.1 10*3/uL — AB (ref 1.5–6.5)
Platelets: 223 10*3/uL (ref 145–400)
RBC: 3.68 10*6/uL — ABNORMAL LOW (ref 3.70–5.45)
RDW: 18.1 % — ABNORMAL HIGH (ref 11.2–14.5)
WBC: 23.5 10*3/uL — ABNORMAL HIGH (ref 3.9–10.3)

## 2014-12-15 MED ORDER — HEPARIN SOD (PORK) LOCK FLUSH 100 UNIT/ML IV SOLN
500.0000 [IU] | Freq: Once | INTRAVENOUS | Status: AC | PRN
  Administered 2014-12-15: 500 [IU]
  Filled 2014-12-15: qty 5

## 2014-12-15 MED ORDER — PACLITAXEL PROTEIN-BOUND CHEMO INJECTION 100 MG
80.0000 mg/m2 | Freq: Once | INTRAVENOUS | Status: AC
  Administered 2014-12-15: 150 mg via INTRAVENOUS
  Filled 2014-12-15: qty 30

## 2014-12-15 MED ORDER — PALONOSETRON HCL INJECTION 0.25 MG/5ML
0.2500 mg | Freq: Once | INTRAVENOUS | Status: AC
  Administered 2014-12-15: 0.25 mg via INTRAVENOUS

## 2014-12-15 MED ORDER — SODIUM CHLORIDE 0.9 % IJ SOLN
10.0000 mL | INTRAMUSCULAR | Status: DC | PRN
  Administered 2014-12-15: 10 mL
  Filled 2014-12-15: qty 10

## 2014-12-15 MED ORDER — LORAZEPAM 0.5 MG PO TABS: 0.5000 mg | ORAL_TABLET | Freq: Four times a day (QID) | ORAL | Status: DC | PRN

## 2014-12-15 MED ORDER — SODIUM CHLORIDE 0.9 % IV SOLN
Freq: Once | INTRAVENOUS | Status: AC
  Administered 2014-12-15: 10:00:00 via INTRAVENOUS

## 2014-12-15 MED ORDER — PALONOSETRON HCL INJECTION 0.25 MG/5ML
INTRAVENOUS | Status: AC
  Filled 2014-12-15: qty 5

## 2014-12-15 NOTE — Telephone Encounter (Signed)
Labs added per pof and patient will get a new schedule/avs today from chemo

## 2014-12-15 NOTE — Patient Instructions (Signed)

## 2014-12-15 NOTE — Progress Notes (Signed)
Patient Care Team: Leandrew Koyanagi, MD as PCP - General (Family Medicine) Fanny Skates, MD as Consulting Physician (General Surgery) Nicholas Lose, MD as Consulting Physician (Hematology and Oncology) Arloa Koh, MD as Consulting Physician (Radiation Oncology) Mauro Kaufmann, RN as Registered Nurse Rockwell Germany, RN as Registered Nurse Holley Bouche, NP as Nurse Practitioner (Nurse Practitioner)  DIAGNOSIS: Breast cancer of upper-outer quadrant of left female breast   Staging form: Breast, AJCC 7th Edition     Clinical stage from 09/14/2014: Stage IIB (T3, N0, M0) - Unsigned     Pathologic stage from 09/26/2014: Stage IIB (T3, N0, cM0) - Signed by Seward Grater, MD on 10/04/2014       Staging comments: Staged on final mastectomy by Dr. Avis Epley.    SUMMARY OF ONCOLOGIC HISTORY:   Breast cancer of upper-outer quadrant of left female breast   09/06/2014 Initial Diagnosis Left breast 3:00 biopsy: Invasive ductal carcinoma with abundant extracellular mucin, ER 97%, PR 98%, Ki-67 47%, HER-2 negative ratio 1.28   09/12/2014 Breast MRI Left breast: 6.5 x 5.3 x 5.1 cm irregular enhancing mass with 2 adjacent 8 mm satellite masses, no lymphadenopathy   09/23/2014 Surgery Left Mastectomy: IDC grade 2 with extracellular mucin 6.2 cm, 4 LN Neg, ER 97%, PR 98%, Her 2 Neg ratio 1.28, Ki 67 47% T3N0 Stage 2B   10/20/2014 -  Chemotherapy Adjuvant chemotherapy with dose since Adriamycin and Cytoxan 4 followed by Abraxane weekly 12    CHIEF COMPLIANT: Week 1/12 Abraxane  INTERVAL HISTORY: Amy [name] is a 64 year old lady with above-mentioned history of left breast cancer currently on adjuvant chemotherapy. She will start her cycle of Abraxane today. She had tolerated Adriamycin and Cytoxan fairly well. Denies any nausea vomiting. Denies any fevers or chills.  REVIEW OF SYSTEMS:   Constitutional: Denies fevers, chills or abnormal weight loss Eyes: Denies blurriness of vision Ears, nose,  mouth, throat, and face: Denies mucositis or sore throat Respiratory: Denies cough, dyspnea or wheezes Cardiovascular: Denies palpitation, chest discomfort or lower extremity swelling Gastrointestinal:  Denies nausea, heartburn or change in bowel habits Skin: Denies abnormal skin rashes Lymphatics: Denies new lymphadenopathy or easy bruising Neurological:Denies numbness, tingling or new weaknesses Behavioral/Psych: Mood is stable, no new changes  Breast:  denies any pain or lumps or nodules in either breasts All other systems were reviewed with the patient and are negative.  I have reviewed the past medical history, past surgical history, social history and family history with the patient and they are unchanged from previous note.  ALLERGIES:  is allergic to codeine; tramadol; darvocet; gold-containing drug products; and sulfa antibiotics.  MEDICATIONS:  Current Outpatient Prescriptions  Medication Sig Dispense Refill  . ALPRAZolam (XANAX) 0.25 MG tablet TAKE 1 TABLET TWICE A DAY AS NEEDED 60 tablet 0  . Alum & Mag Hydroxide-Simeth (MAGIC MOUTHWASH W/LIDOCAINE) SOLN 5-37m four times a day as needed for mouth sores. Swish, gargle, and spit. 240 mL 1  . cetirizine (ZYRTEC) 10 MG tablet Take 10 mg by mouth daily.    . citalopram (CELEXA) 40 MG tablet TAKE 1 TABLET (40 MG TOTAL) BY MOUTH DAILY. 90 tablet 3  . dexamethasone (DECADRON) 4 MG tablet Take 2 tablets by mouth once a day on the day after chemotherapy and then take 2 tablets two times a day for 2 days. Take with food. 30 tablet 1  . HYDROcodone-acetaminophen (NORCO) 5-325 MG per tablet Take 1 tablet by mouth every 4 (four) hours as needed.  For pain    . lidocaine-prilocaine (EMLA) cream Apply to affected area once 30 g 3  . lisinopril (PRINIVIL,ZESTRIL) 5 MG tablet Take 1 tablet (5 mg total) by mouth daily. 90 tablet 3  . LORazepam (ATIVAN) 0.5 MG tablet Take 1 tablet (0.5 mg total) by mouth every 6 (six) hours as needed (Nausea or  vomiting). 30 tablet 0  . metoprolol succinate (TOPROL-XL) 100 MG 24 hr tablet Take 1 tablet (100 mg total) by mouth daily. Take with or immediately following a meal. 90 tablet 3  . ondansetron (ZOFRAN) 4 MG tablet Take 1 tablet (4 mg total) by mouth every 8 (eight) hours as needed for nausea. 20 tablet 0  . ondansetron (ZOFRAN) 8 MG tablet Take 1 tablet (8 mg total) by mouth 2 (two) times daily as needed. Start on the third day after chemotherapy. 30 tablet 1  . phenylephrine (SUDAFED PE) 10 MG TABS tablet Take 10 mg by mouth daily as needed (allergies).    . prochlorperazine (COMPAZINE) 10 MG tablet Take 1 tablet (10 mg total) by mouth every 6 (six) hours as needed (Nausea or vomiting). 30 tablet 1  . promethazine (PHENERGAN) 25 MG tablet Take 1 tablet (25 mg total) by mouth every 6 (six) hours as needed for nausea or vomiting. 30 tablet 1  . SUMAtriptan (IMITREX) 100 MG tablet Take 100 mg by mouth every 2 (two) hours as needed for migraine. May repeat in 2 hours if headache persists or recurs.    Marland Kitchen UNABLE TO FIND 1 each by Other route once. 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 verapamil (VERELAN PM) 180 MG 24 hr capsule TAKE 1 CAPSULE (180 MG TOTAL) BY MOUTH 2 (TWO) TIMES DAILY. 180 capsule 3  . zolpidem (AMBIEN) 10 MG tablet TAKE 1 TABLET AT BEDTIME 30 tablet 0   No current facility-administered medications for this visit.    PHYSICAL EXAMINATION: ECOG PERFORMANCE STATUS: 1 - Symptomatic but completely ambulatory  There were no vitals filed for this visit. There were no vitals filed for this visit.  GENERAL:alert, no distress and comfortable SKIN: skin color, texture, turgor are normal, no rashes or significant lesions EYES: normal, Conjunctiva are pink and non-injected, sclera clear OROPHARYNX:no exudate, no erythema and lips, buccal mucosa, and tongue normal  NECK: supple, thyroid normal size, non-tender,  without nodularity LYMPH:  no palpable lymphadenopathy in the cervical, axillary or inguinal LUNGS: clear to auscultation and percussion with normal breathing effort HEART: regular rate & rhythm and no murmurs and no lower extremity edema ABDOMEN:abdomen soft, non-tender and normal bowel sounds Musculoskeletal:no cyanosis of digits and no clubbing  NEURO: alert & oriented x 3 with fluent speech, no focal motor/sensory deficits  LABORATORY DATA:  I have reviewed the data as listed   Chemistry      Component Value Date/Time   NA 139 12/01/2014 1123   NA 137 09/20/2014 1553   K 3.9 12/01/2014 1123   K 3.7 09/20/2014 1553   CL 104 09/20/2014 1553   CO2 22 12/01/2014 1123   CO2 21 09/20/2014 1553   BUN 6.1* 12/01/2014 1123   BUN 8 09/20/2014 1553   CREATININE 0.6 12/01/2014 1123   CREATININE 0.68 09/23/2014 1920   CREATININE 0.56 08/16/2014 1208      Component Value Date/Time   CALCIUM 9.0 12/01/2014 1123   CALCIUM 9.1 09/20/2014 1553   ALKPHOS 76 12/01/2014 1123   ALKPHOS 34* 09/20/2014  1553   AST 14 12/01/2014 1123   AST 18 09/20/2014 1553   ALT 13 12/01/2014 1123   ALT 11 09/20/2014 1553   BILITOT 0.22 12/01/2014 1123   BILITOT 0.2* 09/20/2014 1553       Lab Results  Component Value Date   WBC 23.5* 12/15/2014   HGB 10.6* 12/15/2014   HCT 33.1* 12/15/2014   MCV 89.9 12/15/2014   PLT 223 12/15/2014   NEUTROABS 19.1* 12/15/2014   ASSESSMENT & PLAN:  Breast cancer of upper-outer quadrant of left female breast Left Mastectomy 09/23/14: IDC grade 2 with extracellular mucin 6.2 cm, 4 LN Neg, ER 97%, PR 98%, Her 2 Neg ratio 1.28, Ki 67 47% T3N0 Stage 2B  Treatment plan: adjuvant chemotherapy withDose dense Adriamycin and Cytoxan 4 followed by Abraxane weekly 12 followed by radiation and antiestrogen therapy, Adriamycin and Cytoxan started 10/20/2014 completed 12/01/2014  Current treatment: Today cycle 1/12 Abraxane. Toxicities of chemotherapy: 1. Severe fatigue 2.  Chemotherapy-induced neutropenia grade 4: Decreased the dose of cycle 2 of chemotherapy 3. Nausea which improved with Compazine. Zofran caused migraines. We gave her Phenergan in addition to Compazine for when necessary nausea treatment. 4. Intermittent diarrhea: Improved with Imodium. 5. Leukocytosis as a result of Neulasta injection.  Return to clinic in 2 weeks for cycle 3/12 Abraxane.   No orders of the defined types were placed in this encounter.   The patient has a good understanding of the overall plan. she agrees with it. she will call with any problems that may develop before the next visit here.   Rulon Eisenmenger, MD

## 2014-12-16 ENCOUNTER — Telehealth: Payer: Self-pay

## 2014-12-16 NOTE — Telephone Encounter (Signed)
-----   Message from Randolm Idol, [place] sent at 12/15/2014 10:30 AM EDT ----- Amy [name]

## 2014-12-16 NOTE — Telephone Encounter (Signed)
Patient states "she is doing fine" after her first dose of abraxane yesterday.  Reports mild nausea and that she has plenty of nausea med.  Let pt know to call with any questions or concerns.  Pt voiced understanding.

## 2014-12-22 ENCOUNTER — Ambulatory Visit (HOSPITAL_BASED_OUTPATIENT_CLINIC_OR_DEPARTMENT_OTHER): Payer: BLUE CROSS/BLUE SHIELD

## 2014-12-22 ENCOUNTER — Other Ambulatory Visit (HOSPITAL_BASED_OUTPATIENT_CLINIC_OR_DEPARTMENT_OTHER): Payer: BLUE CROSS/BLUE SHIELD

## 2014-12-22 ENCOUNTER — Other Ambulatory Visit: Payer: Self-pay | Admitting: *Deleted

## 2014-12-22 VITALS — BP 138/80 | HR 80 | Temp 98.1°F | Resp 18

## 2014-12-22 DIAGNOSIS — C50812 Malignant neoplasm of overlapping sites of left female breast: Secondary | ICD-10-CM

## 2014-12-22 DIAGNOSIS — Z5111 Encounter for antineoplastic chemotherapy: Secondary | ICD-10-CM | POA: Diagnosis not present

## 2014-12-22 DIAGNOSIS — C50412 Malignant neoplasm of upper-outer quadrant of left female breast: Secondary | ICD-10-CM

## 2014-12-22 LAB — CBC WITH DIFFERENTIAL/PLATELET
BASO%: 1.7 % (ref 0.0–2.0)
BASOS ABS: 0.1 10*3/uL (ref 0.0–0.1)
EOS ABS: 0 10*3/uL (ref 0.0–0.5)
EOS%: 1 % (ref 0.0–7.0)
HEMATOCRIT: 31.4 % — AB (ref 34.8–46.6)
HEMOGLOBIN: 10.3 g/dL — AB (ref 11.6–15.9)
LYMPH%: 35.5 % (ref 14.0–49.7)
MCH: 29.2 pg (ref 25.1–34.0)
MCHC: 32.8 g/dL (ref 31.5–36.0)
MCV: 89 fL (ref 79.5–101.0)
MONO#: 0.5 10*3/uL (ref 0.1–0.9)
MONO%: 12 % (ref 0.0–14.0)
NEUT#: 2 10*3/uL (ref 1.5–6.5)
NEUT%: 49.8 % (ref 38.4–76.8)
Platelets: 381 10*3/uL (ref 145–400)
RBC: 3.53 10*6/uL — ABNORMAL LOW (ref 3.70–5.45)
RDW: 18.1 % — ABNORMAL HIGH (ref 11.2–14.5)
WBC: 4.1 10*3/uL (ref 3.9–10.3)
lymph#: 1.5 10*3/uL (ref 0.9–3.3)

## 2014-12-22 LAB — COMPREHENSIVE METABOLIC PANEL (CC13)
ALBUMIN: 3.8 g/dL (ref 3.5–5.0)
ALK PHOS: 45 U/L (ref 40–150)
ALT: 20 U/L (ref 0–55)
AST: 18 U/L (ref 5–34)
Anion Gap: 9 mEq/L (ref 3–11)
BILIRUBIN TOTAL: 0.32 mg/dL (ref 0.20–1.20)
BUN: 6.1 mg/dL — AB (ref 7.0–26.0)
CO2: 27 mEq/L (ref 22–29)
Calcium: 8.8 mg/dL (ref 8.4–10.4)
Chloride: 106 mEq/L (ref 98–109)
Creatinine: 0.6 mg/dL (ref 0.6–1.1)
EGFR: >90 mL/min/{1.73_m2} (ref >90)
Glucose: 112 mg/dl (ref 70–140)
Potassium: 3.7 mEq/L (ref 3.5–5.1)
Sodium: 141 mEq/L (ref 136–145)
Total Protein: 6 g/dL — ABNORMAL LOW (ref 6.4–8.3)

## 2014-12-22 MED ORDER — PALONOSETRON HCL INJECTION 0.25 MG/5ML
0.2500 mg | Freq: Once | INTRAVENOUS | Status: AC
  Administered 2014-12-22: 0.25 mg via INTRAVENOUS

## 2014-12-22 MED ORDER — PALONOSETRON HCL INJECTION 0.25 MG/5ML
INTRAVENOUS | Status: AC
  Filled 2014-12-22: qty 5

## 2014-12-22 MED ORDER — PACLITAXEL PROTEIN-BOUND CHEMO INJECTION 100 MG
80.0000 mg/m2 | Freq: Once | INTRAVENOUS | Status: AC
  Administered 2014-12-22: 150 mg via INTRAVENOUS
  Filled 2014-12-22: qty 30

## 2014-12-22 MED ORDER — SODIUM CHLORIDE 0.9 % IV SOLN
Freq: Once | INTRAVENOUS | Status: AC
  Administered 2014-12-22: 10:00:00 via INTRAVENOUS

## 2014-12-22 MED ORDER — SODIUM CHLORIDE 0.9 % IJ SOLN
10.0000 mL | INTRAMUSCULAR | Status: DC | PRN
  Administered 2014-12-22: 10 mL
  Filled 2014-12-22: qty 10

## 2014-12-22 MED ORDER — HEPARIN SOD (PORK) LOCK FLUSH 100 UNIT/ML IV SOLN
500.0000 [IU] | Freq: Once | INTRAVENOUS | Status: AC | PRN
  Administered 2014-12-22: 500 [IU]
  Filled 2014-12-22: qty 5

## 2014-12-22 MED ORDER — MAGIC MOUTHWASH W/LIDOCAINE: ORAL | Status: DC

## 2014-12-22 NOTE — Patient Instructions (Signed)
[facility] Discharge Instructions for Patients Receiving Chemotherapy  Today you received the following chemotherapy agents Abraxane  To help prevent nausea and vomiting after your treatment, we encourage you to take your nausea medication    If you develop nausea and vomiting that is not controlled by your nausea medication, call the clinic.   BELOW ARE SYMPTOMS THAT SHOULD BE REPORTED IMMEDIATELY:  *FEVER GREATER THAN 100.5 F  *CHILLS WITH OR WITHOUT FEVER  NAUSEA AND VOMITING THAT IS NOT CONTROLLED WITH YOUR NAUSEA MEDICATION  *UNUSUAL SHORTNESS OF BREATH  *UNUSUAL BRUISING OR BLEEDING  TENDERNESS IN MOUTH AND THROAT WITH OR WITHOUT PRESENCE OF ULCERS  *URINARY PROBLEMS  *BOWEL PROBLEMS  UNUSUAL RASH Items with * indicate a potential emergency and should be followed up as soon as possible.  Feel free to call the clinic you have any questions or concerns. The clinic phone number is [phone].  Please show the CHEMO ALERT CARD at check-in to the Emergency Department and triage nurse.   

## 2014-12-26 ENCOUNTER — Other Ambulatory Visit: Payer: Self-pay | Admitting: *Deleted

## 2014-12-26 ENCOUNTER — Telehealth: Payer: Self-pay | Admitting: Hematology and Oncology

## 2014-12-26 NOTE — Telephone Encounter (Signed)
Appointments made per pof and patient will get a new schedule at 5/19 appointment

## 2014-12-28 ENCOUNTER — Telehealth: Payer: Self-pay | Admitting: Radiology

## 2014-12-28 ENCOUNTER — Other Ambulatory Visit: Payer: Self-pay | Admitting: Physician Assistant

## 2014-12-28 NOTE — Telephone Encounter (Signed)
rx called in and pt notified via voicemail.

## 2014-12-29 ENCOUNTER — Other Ambulatory Visit: Payer: Self-pay | Admitting: *Deleted

## 2014-12-29 ENCOUNTER — Ambulatory Visit (HOSPITAL_BASED_OUTPATIENT_CLINIC_OR_DEPARTMENT_OTHER): Payer: BLUE CROSS/BLUE SHIELD

## 2014-12-29 ENCOUNTER — Ambulatory Visit (HOSPITAL_BASED_OUTPATIENT_CLINIC_OR_DEPARTMENT_OTHER): Payer: BLUE CROSS/BLUE SHIELD | Admitting: Hematology and Oncology

## 2014-12-29 ENCOUNTER — Telehealth: Payer: Self-pay | Admitting: Hematology and Oncology

## 2014-12-29 ENCOUNTER — Other Ambulatory Visit (HOSPITAL_BASED_OUTPATIENT_CLINIC_OR_DEPARTMENT_OTHER): Payer: BLUE CROSS/BLUE SHIELD

## 2014-12-29 VITALS — BP 134/73 | HR 80 | Temp 97.1°F | Resp 18 | Ht 66.0 in | Wt 158.1 lb

## 2014-12-29 DIAGNOSIS — Z5111 Encounter for antineoplastic chemotherapy: Secondary | ICD-10-CM

## 2014-12-29 DIAGNOSIS — R110 Nausea: Secondary | ICD-10-CM | POA: Diagnosis not present

## 2014-12-29 DIAGNOSIS — C50412 Malignant neoplasm of upper-outer quadrant of left female breast: Secondary | ICD-10-CM

## 2014-12-29 DIAGNOSIS — C50812 Malignant neoplasm of overlapping sites of left female breast: Secondary | ICD-10-CM

## 2014-12-29 DIAGNOSIS — D72829 Elevated white blood cell count, unspecified: Secondary | ICD-10-CM

## 2014-12-29 DIAGNOSIS — D701 Agranulocytosis secondary to cancer chemotherapy: Secondary | ICD-10-CM

## 2014-12-29 DIAGNOSIS — R197 Diarrhea, unspecified: Secondary | ICD-10-CM

## 2014-12-29 LAB — CBC WITH DIFFERENTIAL/PLATELET
BASO%: 2.8 % — ABNORMAL HIGH (ref 0.0–2.0)
BASOS ABS: 0.1 10*3/uL (ref 0.0–0.1)
EOS ABS: 0.2 10*3/uL (ref 0.0–0.5)
EOS%: 3.9 % (ref 0.0–7.0)
HEMATOCRIT: 30.7 % — AB (ref 34.8–46.6)
HEMOGLOBIN: 10.4 g/dL — AB (ref 11.6–15.9)
LYMPH#: 1.4 10*3/uL (ref 0.9–3.3)
LYMPH%: 30.7 % (ref 14.0–49.7)
MCH: 29.6 pg (ref 25.1–34.0)
MCHC: 33.9 g/dL (ref 31.5–36.0)
MCV: 87.5 fL (ref 79.5–101.0)
MONO#: 0.6 10*3/uL (ref 0.1–0.9)
MONO%: 14.2 % — ABNORMAL HIGH (ref 0.0–14.0)
NEUT%: 48.4 % (ref 38.4–76.8)
NEUTROS ABS: 2.2 10*3/uL (ref 1.5–6.5)
Platelets: 410 10*3/uL — ABNORMAL HIGH (ref 145–400)
RBC: 3.5 10*6/uL — ABNORMAL LOW (ref 3.70–5.45)
RDW: 18.8 % — AB (ref 11.2–14.5)
WBC: 4.6 10*3/uL (ref 3.9–10.3)

## 2014-12-29 LAB — COMPREHENSIVE METABOLIC PANEL (CC13)
ALT: 22 U/L (ref 0–55)
ANION GAP: 11 meq/L (ref 3–11)
AST: 21 U/L (ref 5–34)
Albumin: 3.8 g/dL (ref 3.5–5.0)
Alkaline Phosphatase: 50 U/L (ref 40–150)
BUN: 5.6 mg/dL — ABNORMAL LOW (ref 7.0–26.0)
CALCIUM: 8.8 mg/dL (ref 8.4–10.4)
CHLORIDE: 104 meq/L (ref 98–109)
CO2: 25 mEq/L (ref 22–29)
Creatinine: 0.7 mg/dL (ref 0.6–1.1)
EGFR: >90 mL/min/{1.73_m2} (ref >90)
Glucose: 111 mg/dl (ref 70–140)
Potassium: 3.6 mEq/L (ref 3.5–5.1)
Sodium: 141 mEq/L (ref 136–145)
TOTAL PROTEIN: 6 g/dL — AB (ref 6.4–8.3)
Total Bilirubin: 0.39 mg/dL (ref 0.20–1.20)

## 2014-12-29 MED ORDER — HEPARIN SOD (PORK) LOCK FLUSH 100 UNIT/ML IV SOLN
500.0000 [IU] | Freq: Once | INTRAVENOUS | Status: AC | PRN
  Administered 2014-12-29: 500 [IU]
  Filled 2014-12-29: qty 5

## 2014-12-29 MED ORDER — PALONOSETRON HCL INJECTION 0.25 MG/5ML
INTRAVENOUS | Status: AC
  Filled 2014-12-29: qty 5

## 2014-12-29 MED ORDER — SODIUM CHLORIDE 0.9 % IJ SOLN
10.0000 mL | INTRAMUSCULAR | Status: DC | PRN
  Administered 2014-12-29: 10 mL
  Filled 2014-12-29: qty 10

## 2014-12-29 MED ORDER — SODIUM CHLORIDE 0.9 % IV SOLN
Freq: Once | INTRAVENOUS | Status: AC
  Administered 2014-12-29: 11:00:00 via INTRAVENOUS

## 2014-12-29 MED ORDER — PACLITAXEL PROTEIN-BOUND CHEMO INJECTION 100 MG
80.0000 mg/m2 | Freq: Once | INTRAVENOUS | Status: AC
  Administered 2014-12-29: 150 mg via INTRAVENOUS
  Filled 2014-12-29: qty 30

## 2014-12-29 MED ORDER — PALONOSETRON HCL INJECTION 0.25 MG/5ML
0.2500 mg | Freq: Once | INTRAVENOUS | Status: AC
  Administered 2014-12-29: 0.25 mg via INTRAVENOUS

## 2014-12-29 MED ORDER — PROCHLORPERAZINE MALEATE 10 MG PO TABS: 10.0000 mg | ORAL_TABLET | Freq: Four times a day (QID) | ORAL | Status: DC | PRN

## 2014-12-29 NOTE — Assessment & Plan Note (Signed)
Left Mastectomy 09/23/14: IDC grade 2 with extracellular mucin 6.2 cm, 4 LN Neg, ER 97%, PR 98%, Her 2 Neg ratio 1.28, Ki 67 47% T3N0 Stage 2B  Treatment plan: adjuvant chemotherapy withDose dense Adriamycin and Cytoxan 4 followed by Abraxane weekly 12 followed by radiation and antiestrogen therapy, Adriamycin and Cytoxan started 10/20/2014 completed 12/01/2014  Current treatment: Today cycle 3/12 Abraxane. Toxicities of chemotherapy: 1. Severe fatigue 2. Chemotherapy-induced neutropenia grade 4: Decreased the dose of cycle 2 of chemotherapy 3. Nausea which improved with Compazine. Zofran caused migraines. We gave her Phenergan in addition to Compazine for when necessary nausea treatment. 4. Intermittent diarrhea: Improved with Imodium. 5. Leukocytosis as a result of Neulasta injection.  Return to clinic in 2 weeks for cycle 5/12 Abraxane.

## 2014-12-29 NOTE — Patient Instructions (Signed)
[facility] Discharge Instructions for Patients Receiving Chemotherapy  Today you received the following chemotherapy agents Abraxane  To help prevent nausea and vomiting after your treatment, we encourage you to take your nausea medication Phenergan 25 mg every 6 hours as needed.  If you develop nausea and vomiting that is not controlled by your nausea medication, call the clinic.   BELOW ARE SYMPTOMS THAT SHOULD BE REPORTED IMMEDIATELY:  *FEVER GREATER THAN 100.5 F  *CHILLS WITH OR WITHOUT FEVER  NAUSEA AND VOMITING THAT IS NOT CONTROLLED WITH YOUR NAUSEA MEDICATION  *UNUSUAL SHORTNESS OF BREATH  *UNUSUAL BRUISING OR BLEEDING  TENDERNESS IN MOUTH AND THROAT WITH OR WITHOUT PRESENCE OF ULCERS  *URINARY PROBLEMS  *BOWEL PROBLEMS  UNUSUAL RASH Items with * indicate a potential emergency and should be followed up as soon as possible.  Feel free to call the clinic you have any questions or concerns. The clinic phone number is [phone].  Please show the [place] at check-in to the Emergency Department and triage nurse.

## 2014-12-29 NOTE — Telephone Encounter (Signed)
Gave calendar for may thru July. Patient didn't want AVS, avs shredded.

## 2014-12-29 NOTE — Progress Notes (Signed)
Patient Care Team: Robert P Doolittle, MD as PCP - General (Family Medicine) Haywood Ingram, MD as Consulting Physician (General Surgery) Vinay Gudena, MD as Consulting Physician (Hematology and Oncology) Robert Murray, MD as Consulting Physician (Radiation Oncology) Dawn C Stuart, RN as Registered Nurse Keisha N Martini, RN as Registered Nurse Gretchen W Dawson, NP as Nurse Practitioner (Nurse Practitioner)  DIAGNOSIS: Breast cancer of upper-outer quadrant of left female breast   Staging form: Breast, AJCC 7th Edition     Clinical stage from 09/14/2014: Stage IIB (T3, N0, M0) - Unsigned     Pathologic stage from 09/26/2014: Stage IIB (T3, N0, cM0) - Signed by Joshua B Kish, MD on 10/04/2014       Staging comments: Staged on final mastectomy by Dr. Hillard.    SUMMARY OF ONCOLOGIC HISTORY:   Breast cancer of upper-outer quadrant of left female breast   09/06/2014 Initial Diagnosis Left breast 3:00 biopsy: Invasive ductal carcinoma with abundant extracellular mucin, ER 97%, PR 98%, Ki-67 47%, HER-2 negative ratio 1.28   09/12/2014 Breast MRI Left breast: 6.5 x 5.3 x 5.1 cm irregular enhancing mass with 2 adjacent 8 mm satellite masses, no lymphadenopathy   09/23/2014 Surgery Left Mastectomy: IDC grade 2 with extracellular mucin 6.2 cm, 4 LN Neg, ER 97%, PR 98%, Her 2 Neg ratio 1.28, Ki 67 47% T3N0 Stage 2B   10/20/2014 -  Chemotherapy Adjuvant chemotherapy with dose since Adriamycin and Cytoxan 4 followed by Abraxane weekly 12    CHIEF COMPLIANT: Week 5/12 Abraxane  INTERVAL HISTORY: Amy [name] is a 64-year-old with above-mentioned history of left-sided breast cancer currently on adjuvant chemotherapy. She is currently in Abraxane. She is tolerating it fairly well with very mild nausea and maybe mild fatigue. He denies any neuropathy.  REVIEW OF SYSTEMS:   Constitutional: Denies fevers, chills or abnormal weight loss Eyes: Denies blurriness of vision Ears, nose, mouth, throat, and  face: Denies mucositis or sore throat Respiratory: Denies cough, dyspnea or wheezes Cardiovascular: Denies palpitation, chest discomfort or lower extremity swelling Gastrointestinal:  Denies nausea, heartburn or change in bowel habits Skin: Denies abnormal skin rashes Lymphatics: Denies new lymphadenopathy or easy bruising Neurological:Denies numbness, tingling or new weaknesses Behavioral/Psych: Mood is stable, no new changes  Breast:  denies any pain or lumps or nodules in either breasts All other systems were reviewed with the patient and are negative.  I have reviewed the past medical history, past surgical history, social history and family history with the patient and they are unchanged from previous note.  ALLERGIES:  is allergic to codeine; tramadol; darvocet; gold-containing drug products; and sulfa antibiotics.  MEDICATIONS:  Current Outpatient Prescriptions  Medication Sig Dispense Refill  . ALPRAZolam (XANAX) 0.25 MG tablet TAKE 1 TABLET TWICE A DAY AS NEEDED 60 tablet 0  . Alum & Mag Hydroxide-Simeth (MAGIC MOUTHWASH W/LIDOCAINE) SOLN 5-10ml four times a day as needed for mouth sores. Swish, gargle, and spit. 240 mL 2  . cetirizine (ZYRTEC) 10 MG tablet Take 10 mg by mouth daily.    . citalopram (CELEXA) 40 MG tablet TAKE 1 TABLET (40 MG TOTAL) BY MOUTH DAILY. 90 tablet 3  . HYDROcodone-acetaminophen (NORCO) 5-325 MG per tablet Take 1 tablet by mouth every 4 (four) hours as needed. For pain    . lisinopril (PRINIVIL,ZESTRIL) 5 MG tablet Take 1 tablet (5 mg total) by mouth daily. 90 tablet 3  . LORazepam (ATIVAN) 0.5 MG tablet Take 1 tablet (0.5 mg total) by mouth every 6 (six)   hours as needed (Nausea or vomiting). 30 tablet 5  . metoprolol succinate (TOPROL-XL) 100 MG 24 hr tablet Take 1 tablet (100 mg total) by mouth daily. Take with or immediately following a meal. 90 tablet 3  . ondansetron (ZOFRAN) 4 MG tablet Take 1 tablet (4 mg total) by mouth every 8 (eight) hours as  needed for nausea. 20 tablet 0  . phenylephrine (SUDAFED PE) 10 MG TABS tablet Take 10 mg by mouth daily as needed (allergies).    . promethazine (PHENERGAN) 25 MG tablet Take 1 tablet (25 mg total) by mouth every 6 (six) hours as needed for nausea or vomiting. 30 tablet 1  . SUMAtriptan (IMITREX) 100 MG tablet Take 100 mg by mouth every 2 (two) hours as needed for migraine. May repeat in 2 hours if headache persists or recurs.    Marland Kitchen UNABLE TO FIND 1 each by Other route once. 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 verapamil (VERELAN PM) 180 MG 24 hr capsule TAKE 1 CAPSULE (180 MG TOTAL) BY MOUTH 2 (TWO) TIMES DAILY. 180 capsule 3  . zolpidem (AMBIEN) 10 MG tablet TAKE 1 TABLET AT BEDTIME 30 tablet 0   No current facility-administered medications for this visit.    PHYSICAL EXAMINATION: ECOG PERFORMANCE STATUS: 1 - Symptomatic but completely ambulatory  Filed Vitals:   12/29/14 0949  BP: 134/73  Pulse: 80  Temp: 97.1 F (36.2 C)  Resp: 18   Filed Weights   12/29/14 0949  Weight: 158 lb 1.6 oz (71.714 kg)    GENERAL:alert, no distress and comfortable SKIN: skin color, texture, turgor are normal, no rashes or significant lesions EYES: normal, Conjunctiva are pink and non-injected, sclera clear OROPHARYNX:no exudate, no erythema and lips, buccal mucosa, and tongue normal  NECK: supple, thyroid normal size, non-tender, without nodularity LYMPH:  no palpable lymphadenopathy in the cervical, axillary or inguinal LUNGS: clear to auscultation and percussion with normal breathing effort HEART: regular rate & rhythm and no murmurs and no lower extremity edema ABDOMEN:abdomen soft, non-tender and normal bowel sounds Musculoskeletal:no cyanosis of digits and no clubbing  NEURO: alert & oriented x 3 with fluent speech, no focal motor/sensory deficits  LABORATORY DATA:  I have reviewed the data as listed   Chemistry       Component Value Date/Time   NA 141 12/29/2014 0937   NA 137 09/20/2014 1553   K 3.6 12/29/2014 0937   K 3.7 09/20/2014 1553   CL 104 09/20/2014 1553   CO2 25 12/29/2014 0937   CO2 21 09/20/2014 1553   BUN 5.6* 12/29/2014 0937   BUN 8 09/20/2014 1553   CREATININE 0.7 12/29/2014 0937   CREATININE 0.68 09/23/2014 1920   CREATININE 0.56 08/16/2014 1208      Component Value Date/Time   CALCIUM 8.8 12/29/2014 0937   CALCIUM 9.1 09/20/2014 1553   ALKPHOS 50 12/29/2014 0937   ALKPHOS 34* 09/20/2014 1553   AST 21 12/29/2014 0937   AST 18 09/20/2014 1553   ALT 22 12/29/2014 0937   ALT 11 09/20/2014 1553   BILITOT 0.39 12/29/2014 0937   BILITOT 0.2* 09/20/2014 1553       Lab Results  Component Value Date   WBC 4.6 12/29/2014   HGB 10.4* 12/29/2014   HCT 30.7* 12/29/2014   MCV 87.5 12/29/2014   PLT 410* 12/29/2014   NEUTROABS 2.2 12/29/2014    ASSESSMENT & PLAN:  Breast cancer of  upper-outer quadrant of left female breast Left Mastectomy 09/23/14: IDC grade 2 with extracellular mucin 6.2 cm, 4 LN Neg, ER 97%, PR 98%, Her 2 Neg ratio 1.28, Ki 67 47% T3N0 Stage 2B  Treatment plan: adjuvant chemotherapy withDose dense Adriamycin and Cytoxan 4 followed by Abraxane weekly 12 followed by radiation and antiestrogen therapy, Adriamycin and Cytoxan started 10/20/2014 completed 12/01/2014  Current treatment: Today cycle 3/12 Abraxane. Toxicities of chemotherapy: 1. Severe fatigue 2. Chemotherapy-induced neutropenia grade 4: Decreased the dose of cycle 2 of chemotherapy 3. Nausea which improved with Compazine. Zofran caused migraines. We gave her Phenergan in addition to Compazine for when necessary nausea treatment. 4. Intermittent diarrhea: Improved with Imodium. 5. Leukocytosis as a result of Neulasta injection.  Return to clinic in 2 weeks for cycle 5/12 Abraxane.     No orders of the defined types were placed in this encounter.   The patient has a good understanding of  the overall plan. she agrees with it. she will call with any problems that may develop before the next visit here.   Gudena, Vinay K, MD    

## 2015-01-03 ENCOUNTER — Other Ambulatory Visit: Payer: Self-pay | Admitting: Physician Assistant

## 2015-01-04 ENCOUNTER — Telehealth: Payer: Self-pay | Admitting: Radiology

## 2015-01-04 NOTE — Telephone Encounter (Signed)
lmom that i am faxing rx.

## 2015-01-05 ENCOUNTER — Ambulatory Visit (HOSPITAL_BASED_OUTPATIENT_CLINIC_OR_DEPARTMENT_OTHER): Payer: BLUE CROSS/BLUE SHIELD

## 2015-01-05 ENCOUNTER — Other Ambulatory Visit (HOSPITAL_BASED_OUTPATIENT_CLINIC_OR_DEPARTMENT_OTHER): Payer: BLUE CROSS/BLUE SHIELD

## 2015-01-05 VITALS — BP 134/79 | HR 77 | Temp 97.4°F | Resp 18

## 2015-01-05 DIAGNOSIS — C50812 Malignant neoplasm of overlapping sites of left female breast: Secondary | ICD-10-CM

## 2015-01-05 DIAGNOSIS — Z5111 Encounter for antineoplastic chemotherapy: Secondary | ICD-10-CM | POA: Diagnosis not present

## 2015-01-05 DIAGNOSIS — C50412 Malignant neoplasm of upper-outer quadrant of left female breast: Secondary | ICD-10-CM

## 2015-01-05 LAB — CBC WITH DIFFERENTIAL/PLATELET
BASO%: 3.5 % — AB (ref 0.0–2.0)
BASOS ABS: 0.2 10*3/uL — AB (ref 0.0–0.1)
EOS%: 4.6 % (ref 0.0–7.0)
Eosinophils Absolute: 0.2 10*3/uL (ref 0.0–0.5)
HEMATOCRIT: 32.5 % — AB (ref 34.8–46.6)
HEMOGLOBIN: 11 g/dL — AB (ref 11.6–15.9)
LYMPH%: 41.3 % (ref 14.0–49.7)
MCH: 29.9 pg (ref 25.1–34.0)
MCHC: 33.8 g/dL (ref 31.5–36.0)
MCV: 88.3 fL (ref 79.5–101.0)
MONO#: 0.5 10*3/uL (ref 0.1–0.9)
MONO%: 12.5 % (ref 0.0–14.0)
NEUT#: 1.7 10*3/uL (ref 1.5–6.5)
NEUT%: 38.1 % — ABNORMAL LOW (ref 38.4–76.8)
PLATELETS: 397 10*3/uL (ref 145–400)
RBC: 3.68 10*6/uL — AB (ref 3.70–5.45)
RDW: 16.6 % — ABNORMAL HIGH (ref 11.2–14.5)
WBC: 4.3 10*3/uL (ref 3.9–10.3)
lymph#: 1.8 10*3/uL (ref 0.9–3.3)
nRBC: 0 % (ref 0–0)

## 2015-01-05 LAB — COMPREHENSIVE METABOLIC PANEL (CC13)
ALT: 18 U/L (ref 0–55)
ANION GAP: 10 meq/L (ref 3–11)
AST: 21 U/L (ref 5–34)
Albumin: 3.8 g/dL (ref 3.5–5.0)
Alkaline Phosphatase: 52 U/L (ref 40–150)
BUN: 4.6 mg/dL — ABNORMAL LOW (ref 7.0–26.0)
CALCIUM: 8.7 mg/dL (ref 8.4–10.4)
CO2: 24 mEq/L (ref 22–29)
Chloride: 107 mEq/L (ref 98–109)
Creatinine: 0.6 mg/dL (ref 0.6–1.1)
GLUCOSE: 105 mg/dL (ref 70–140)
Potassium: 4 mEq/L (ref 3.5–5.1)
Sodium: 141 mEq/L (ref 136–145)
TOTAL PROTEIN: 5.9 g/dL — AB (ref 6.4–8.3)
Total Bilirubin: 0.35 mg/dL (ref 0.20–1.20)

## 2015-01-05 MED ORDER — HEPARIN SOD (PORK) LOCK FLUSH 100 UNIT/ML IV SOLN
500.0000 [IU] | Freq: Once | INTRAVENOUS | Status: AC | PRN
  Administered 2015-01-05: 500 [IU]
  Filled 2015-01-05: qty 5

## 2015-01-05 MED ORDER — SODIUM CHLORIDE 0.9 % IV SOLN
Freq: Once | INTRAVENOUS | Status: AC
  Administered 2015-01-05: 11:00:00 via INTRAVENOUS

## 2015-01-05 MED ORDER — PALONOSETRON HCL INJECTION 0.25 MG/5ML
0.2500 mg | Freq: Once | INTRAVENOUS | Status: AC
  Administered 2015-01-05: 0.25 mg via INTRAVENOUS

## 2015-01-05 MED ORDER — PACLITAXEL PROTEIN-BOUND CHEMO INJECTION 100 MG
80.0000 mg/m2 | Freq: Once | INTRAVENOUS | Status: AC
  Administered 2015-01-05: 150 mg via INTRAVENOUS
  Filled 2015-01-05: qty 30

## 2015-01-05 MED ORDER — PALONOSETRON HCL INJECTION 0.25 MG/5ML
INTRAVENOUS | Status: AC
  Filled 2015-01-05: qty 5

## 2015-01-05 MED ORDER — SODIUM CHLORIDE 0.9 % IJ SOLN
10.0000 mL | INTRAMUSCULAR | Status: DC | PRN
  Administered 2015-01-05: 10 mL
  Filled 2015-01-05: qty 10

## 2015-01-05 NOTE — Patient Instructions (Signed)
[facility] Discharge Instructions for Patients Receiving Chemotherapy  Today you received the following chemotherapy agents Abraxane To help prevent nausea and vomiting after your treatment, we encourage you to take your nausea medication as prescribed.   If you develop nausea and vomiting that is not controlled by your nausea medication, call the clinic.   BELOW ARE SYMPTOMS THAT SHOULD BE REPORTED IMMEDIATELY:  *FEVER GREATER THAN 100.5 F  *CHILLS WITH OR WITHOUT FEVER  NAUSEA AND VOMITING THAT IS NOT CONTROLLED WITH YOUR NAUSEA MEDICATION  *UNUSUAL SHORTNESS OF BREATH  *UNUSUAL BRUISING OR BLEEDING  TENDERNESS IN MOUTH AND THROAT WITH OR WITHOUT PRESENCE OF ULCERS  *URINARY PROBLEMS  *BOWEL PROBLEMS  UNUSUAL RASH Items with * indicate a potential emergency and should be followed up as soon as possible.  Feel free to call the clinic you have any questions or concerns. The clinic phone number is [phone].  Please show the CHEMO ALERT CARD at check-in to the Emergency Department and triage nurse.   

## 2015-01-12 ENCOUNTER — Ambulatory Visit (HOSPITAL_BASED_OUTPATIENT_CLINIC_OR_DEPARTMENT_OTHER): Payer: BLUE CROSS/BLUE SHIELD

## 2015-01-12 ENCOUNTER — Other Ambulatory Visit (HOSPITAL_BASED_OUTPATIENT_CLINIC_OR_DEPARTMENT_OTHER): Payer: BLUE CROSS/BLUE SHIELD

## 2015-01-12 ENCOUNTER — Encounter: Payer: Self-pay | Admitting: Nurse Practitioner

## 2015-01-12 ENCOUNTER — Ambulatory Visit (HOSPITAL_BASED_OUTPATIENT_CLINIC_OR_DEPARTMENT_OTHER): Payer: BLUE CROSS/BLUE SHIELD | Admitting: Nurse Practitioner

## 2015-01-12 VITALS — BP 132/82 | HR 115 | Temp 98.2°F | Resp 18 | Ht 66.0 in | Wt 158.9 lb

## 2015-01-12 DIAGNOSIS — D701 Agranulocytosis secondary to cancer chemotherapy: Secondary | ICD-10-CM

## 2015-01-12 DIAGNOSIS — C50412 Malignant neoplasm of upper-outer quadrant of left female breast: Secondary | ICD-10-CM

## 2015-01-12 DIAGNOSIS — R5383 Other fatigue: Secondary | ICD-10-CM

## 2015-01-12 DIAGNOSIS — R110 Nausea: Secondary | ICD-10-CM

## 2015-01-12 DIAGNOSIS — D72829 Elevated white blood cell count, unspecified: Secondary | ICD-10-CM | POA: Diagnosis not present

## 2015-01-12 DIAGNOSIS — R197 Diarrhea, unspecified: Secondary | ICD-10-CM

## 2015-01-12 DIAGNOSIS — C50812 Malignant neoplasm of overlapping sites of left female breast: Secondary | ICD-10-CM

## 2015-01-12 DIAGNOSIS — Z5111 Encounter for antineoplastic chemotherapy: Secondary | ICD-10-CM

## 2015-01-12 LAB — CBC WITH DIFFERENTIAL/PLATELET
BASO%: 1.7 % (ref 0.0–2.0)
BASOS ABS: 0.1 10*3/uL (ref 0.0–0.1)
EOS ABS: 0.2 10*3/uL (ref 0.0–0.5)
EOS%: 6.7 % (ref 0.0–7.0)
HEMATOCRIT: 33.2 % — AB (ref 34.8–46.6)
HGB: 11.2 g/dL — ABNORMAL LOW (ref 11.6–15.9)
LYMPH#: 1.4 10*3/uL (ref 0.9–3.3)
LYMPH%: 39.6 % (ref 14.0–49.7)
MCH: 30.1 pg (ref 25.1–34.0)
MCHC: 33.9 g/dL (ref 31.5–36.0)
MCV: 88.6 fL (ref 79.5–101.0)
MONO#: 0.3 10*3/uL (ref 0.1–0.9)
MONO%: 9.5 % (ref 0.0–14.0)
NEUT%: 42.5 % (ref 38.4–76.8)
NEUTROS ABS: 1.5 10*3/uL (ref 1.5–6.5)
Platelets: 367 10*3/uL (ref 145–400)
RBC: 3.74 10*6/uL (ref 3.70–5.45)
RDW: 17.5 % — AB (ref 11.2–14.5)
WBC: 3.6 10*3/uL — ABNORMAL LOW (ref 3.9–10.3)

## 2015-01-12 LAB — COMPREHENSIVE METABOLIC PANEL (CC13)
ALT: 20 U/L (ref 0–55)
AST: 19 U/L (ref 5–34)
Albumin: 4 g/dL (ref 3.5–5.0)
Alkaline Phosphatase: 52 U/L (ref 40–150)
Anion Gap: 9 mEq/L (ref 3–11)
BUN: 6.6 mg/dL — ABNORMAL LOW (ref 7.0–26.0)
CALCIUM: 9.1 mg/dL (ref 8.4–10.4)
CO2: 25 meq/L (ref 22–29)
Chloride: 107 mEq/L (ref 98–109)
Creatinine: 0.7 mg/dL (ref 0.6–1.1)
EGFR: >90 mL/min/{1.73_m2} (ref >90)
Glucose: 108 mg/dl (ref 70–140)
Potassium: 3.7 mEq/L (ref 3.5–5.1)
SODIUM: 142 meq/L (ref 136–145)
Total Bilirubin: 0.36 mg/dL (ref 0.20–1.20)
Total Protein: 6.1 g/dL — ABNORMAL LOW (ref 6.4–8.3)

## 2015-01-12 MED ORDER — PALONOSETRON HCL INJECTION 0.25 MG/5ML
INTRAVENOUS | Status: AC
  Filled 2015-01-12: qty 5

## 2015-01-12 MED ORDER — SODIUM CHLORIDE 0.9 % IJ SOLN
10.0000 mL | INTRAMUSCULAR | Status: DC | PRN
  Administered 2015-01-12: 10 mL
  Filled 2015-01-12: qty 10

## 2015-01-12 MED ORDER — PALONOSETRON HCL INJECTION 0.25 MG/5ML
0.2500 mg | Freq: Once | INTRAVENOUS | Status: AC
  Administered 2015-01-12: 0.25 mg via INTRAVENOUS

## 2015-01-12 MED ORDER — SODIUM CHLORIDE 0.9 % IV SOLN
Freq: Once | INTRAVENOUS | Status: AC
  Administered 2015-01-12: 11:00:00 via INTRAVENOUS

## 2015-01-12 MED ORDER — HEPARIN SOD (PORK) LOCK FLUSH 100 UNIT/ML IV SOLN
500.0000 [IU] | Freq: Once | INTRAVENOUS | Status: AC | PRN
  Administered 2015-01-12: 500 [IU]
  Filled 2015-01-12: qty 5

## 2015-01-12 MED ORDER — PACLITAXEL PROTEIN-BOUND CHEMO INJECTION 100 MG
80.0000 mg/m2 | Freq: Once | INTRAVENOUS | Status: AC
  Administered 2015-01-12: 150 mg via INTRAVENOUS
  Filled 2015-01-12: qty 30

## 2015-01-12 NOTE — Assessment & Plan Note (Signed)
Left Mastectomy 09/23/14: IDC grade 2 with extracellular mucin 6.2 cm, 4 LN Neg, ER 97%, PR 98%, Her 2 Neg ratio 1.28, Ki 67 47% T3N0 Stage 2B  Treatment plan: adjuvant chemotherapy withDose dense Adriamycin and Cytoxan 4 followed by Abraxane weekly 12 followed by radiation and antiestrogen therapy, Adriamycin and Cytoxan started 10/20/2014 completed 12/01/2014  Current treatment: Today cycle 5/12 Abraxane. Toxicities of chemotherapy: 1. Severe fatigue 2. Chemotherapy-induced neutropenia grade 4: Decreased the dose of cycle 2 of chemotherapy 3. Nausea which improved with Compazine. Zofran caused migraines. We gave her Phenergan in addition to Compazine for when necessary nausea treatment. 4. Intermittent diarrhea: Improved with Imodium. 5. Leukocytosis as a result of Neulasta injection.  Return to clinic in 2 weeks for cycle 7/12 Abraxane.

## 2015-01-12 NOTE — Patient Instructions (Signed)
[facility] Center Discharge Instructions for Patients Receiving Chemotherapy  Today you received the following chemotherapy agents Abraxane To help prevent nausea and vomiting after your treatment, we encourage you to take your nausea medication as prescribed.   If you develop nausea and vomiting that is not controlled by your nausea medication, call the clinic.   BELOW ARE SYMPTOMS THAT SHOULD BE REPORTED IMMEDIATELY:  *FEVER GREATER THAN 100.5 F  *CHILLS WITH OR WITHOUT FEVER  NAUSEA AND VOMITING THAT IS NOT CONTROLLED WITH YOUR NAUSEA MEDICATION  *UNUSUAL SHORTNESS OF BREATH  *UNUSUAL BRUISING OR BLEEDING  TENDERNESS IN MOUTH AND THROAT WITH OR WITHOUT PRESENCE OF ULCERS  *URINARY PROBLEMS  *BOWEL PROBLEMS  UNUSUAL RASH Items with * indicate a potential emergency and should be followed up as soon as possible.  Feel free to call the clinic you have any questions or concerns. The clinic phone number is [phone].  Please show the CHEMO ALERT CARD at check-in to the Emergency Department and triage nurse.   

## 2015-01-12 NOTE — Progress Notes (Signed)
Patient Care Team: Leandrew Koyanagi, MD as PCP - General (Family Medicine) Fanny Skates, MD as Consulting Physician (General Surgery) Nicholas Lose, MD as Consulting Physician (Hematology and Oncology) Arloa Koh, MD as Consulting Physician (Radiation Oncology) Mauro Kaufmann, RN as Registered Nurse Rockwell Germany, RN as Registered Nurse Holley Bouche, NP as Nurse Practitioner (Nurse Practitioner)  DIAGNOSIS: Breast cancer of upper-outer quadrant of left female breast   Staging form: Breast, AJCC 7th Edition     Clinical stage from 09/14/2014: Stage IIB (T3, N0, M0) - Unsigned     Pathologic stage from 09/26/2014: Stage IIB (T3, N0, cM0) - Signed by Seward Grater, MD on 10/04/2014       Staging comments: Staged on final mastectomy by Dr. Avis Epley.    SUMMARY OF ONCOLOGIC HISTORY:   Breast cancer of upper-outer quadrant of left female breast   09/06/2014 Initial Diagnosis Left breast 3:00 biopsy: Invasive ductal carcinoma with abundant extracellular mucin, ER 97%, PR 98%, Ki-67 47%, HER-2 negative ratio 1.28   09/12/2014 Breast MRI Left breast: 6.5 x 5.3 x 5.1 cm irregular enhancing mass with 2 adjacent 8 mm satellite masses, no lymphadenopathy   09/23/2014 Surgery Left Mastectomy: IDC grade 2 with extracellular mucin 6.2 cm, 4 LN Neg, ER 97%, PR 98%, Her 2 Neg ratio 1.28, Ki 67 47% T3N0 Stage 2B   10/20/2014 -  Chemotherapy Adjuvant chemotherapy with dose since Adriamycin and Cytoxan 4 followed by Abraxane weekly 12    CHIEF COMPLIANT: Week 5/12 Abraxane  INTERVAL HISTORY: Amy [name] is a 64 year old with above-mentioned history of left-sided breast cancer currently on adjuvant chemotherapy. She is currently on week 5 of abraxane. She tolerates the treatment generally well. She has nausea for 2 days afterwards but it resolved. She is moving her bowels well. Her appetite is healthy. She sleeps well with ambien or ativan. She has mouth sores on occasion, but these resolve with  magic mouth wash. She had neuropathy and foot pain to the bottom of her bilateral feet before the start of chemotherapy.   REVIEW OF SYSTEMS:   Constitutional: Denies fevers, chills or abnormal weight loss Eyes: Denies blurriness of vision Ears, nose, mouth, throat, and face: denies sore throat, occasional mucositis  Respiratory: Denies cough, dyspnea or wheezes Cardiovascular: Denies palpitation, chest discomfort or lower extremity swelling Gastrointestinal: heartburn or change in bowel habits, occasional nausea Skin: Denies abnormal skin rashes Lymphatics: Denies new lymphadenopathy or easy bruising Neurological:Denies numbness, tingling or new weaknesses Behavioral/Psych: Mood is stable, no new changes  Breast:  denies any pain or lumps or nodules in either breasts All other systems were reviewed with the patient and are negative.  I have reviewed the past medical history, past surgical history, social history and family history with the patient and they are unchanged from previous note.  ALLERGIES:  is allergic to codeine; tramadol; darvocet; gold-containing drug products; and sulfa antibiotics.  MEDICATIONS:  Current Outpatient Prescriptions  Medication Sig Dispense Refill  . Alum & Mag Hydroxide-Simeth (MAGIC MOUTHWASH W/LIDOCAINE) SOLN 5-6m four times a day as needed for mouth sores. Swish, gargle, and spit. 240 mL 2  . cetirizine (ZYRTEC) 10 MG tablet Take 10 mg by mouth daily.    . citalopram (CELEXA) 40 MG tablet TAKE 1 TABLET (40 MG TOTAL) BY MOUTH DAILY. 90 tablet 3  . dexamethasone (DECADRON) 4 MG tablet   1  . lidocaine-prilocaine (EMLA) cream   2  . lisinopril (PRINIVIL,ZESTRIL) 5 MG tablet Take 1 tablet (  5 mg total) by mouth daily. 90 tablet 3  . metoprolol succinate (TOPROL-XL) 100 MG 24 hr tablet Take 1 tablet (100 mg total) by mouth daily. Take with or immediately following a meal. 90 tablet 3  . prochlorperazine (COMPAZINE) 10 MG tablet Take 1 tablet (10 mg total)  by mouth every 6 (six) hours as needed for nausea or vomiting. 30 tablet 1  . UNABLE TO FIND 1 each by Other route once. 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 verapamil (VERELAN PM) 180 MG 24 hr capsule TAKE 1 CAPSULE (180 MG TOTAL) BY MOUTH 2 (TWO) TIMES DAILY. 180 capsule 3  . ALPRAZolam (XANAX) 0.25 MG tablet TAKE 1 TABLET TWICE A DAY AS NEEDED (Patient not taking: Reported on 01/12/2015) 60 tablet 0  . HYDROcodone-acetaminophen (NORCO) 5-325 MG per tablet Take 1 tablet by mouth every 4 (four) hours as needed. For pain    . LORazepam (ATIVAN) 0.5 MG tablet Take 1 tablet (0.5 mg total) by mouth every 6 (six) hours as needed (Nausea or vomiting). (Patient not taking: Reported on 01/12/2015) 30 tablet 5  . nystatin (MYCOSTATIN) 100000 UNIT/ML suspension   1  . phenylephrine (SUDAFED PE) 10 MG TABS tablet Take 10 mg by mouth daily as needed (allergies).    . promethazine (PHENERGAN) 25 MG tablet Take 1 tablet (25 mg total) by mouth every 6 (six) hours as needed for nausea or vomiting. (Patient not taking: Reported on 01/12/2015) 30 tablet 1  . SUMAtriptan (IMITREX) 100 MG tablet Take 100 mg by mouth every 2 (two) hours as needed for migraine. May repeat in 2 hours if headache persists or recurs.    Marland Kitchen zolpidem (AMBIEN) 10 MG tablet TAKE 1 TABLET AT BEDTIME (Patient not taking: Reported on 01/12/2015) 30 tablet 0   No current facility-administered medications for this visit.   Facility-Administered Medications Ordered in Other Visits  Medication Dose Route Frequency Provider Last Rate Last Dose  . heparin lock flush 100 unit/mL  500 Units Intracatheter Once PRN Nicholas Lose, MD      . PACLitaxel-protein bound (ABRAXANE) chemo infusion 150 mg  80 mg/m2 (Treatment Plan Actual) Intravenous Once Nicholas Lose, MD      . sodium chloride 0.9 % injection 10 mL  10 mL Intracatheter PRN Nicholas Lose, MD        PHYSICAL EXAMINATION: ECOG  PERFORMANCE STATUS: 1 - Symptomatic but completely ambulatory  Filed Vitals:   01/12/15 0957  BP: 132/82  Pulse:   Temp:   Resp:    Filed Weights   01/12/15 0929  Weight: 158 lb 14.4 oz (72.077 kg)    GENERAL:alert, no distress and comfortable SKIN: skin color, texture, turgor are normal, no rashes or significant lesions EYES: normal, Conjunctiva are pink and non-injected, sclera clear OROPHARYNX:no exudate, no erythema and lips, buccal mucosa, and tongue normal  NECK: supple, thyroid normal size, non-tender, without nodularity LYMPH:  no palpable lymphadenopathy in the cervical, axillary or inguinal LUNGS: clear to auscultation and percussion with normal breathing effort HEART: regular rate & rhythm and no murmurs and no lower extremity edema ABDOMEN:abdomen soft, non-tender and normal bowel sounds Musculoskeletal:no cyanosis of digits and no clubbing  NEURO: alert & oriented x 3 with fluent speech, no focal motor/sensory deficits  LABORATORY DATA:  I have reviewed the data as listed   Chemistry      Component Value Date/Time   NA 142 01/12/2015 0917  NA 137 09/20/2014 1553   K 3.7 01/12/2015 0917   K 3.7 09/20/2014 1553   CL 104 09/20/2014 1553   CO2 25 01/12/2015 0917   CO2 21 09/20/2014 1553   BUN 6.6* 01/12/2015 0917   BUN 8 09/20/2014 1553   CREATININE 0.7 01/12/2015 0917   CREATININE 0.68 09/23/2014 1920   CREATININE 0.56 08/16/2014 1208      Component Value Date/Time   CALCIUM 9.1 01/12/2015 0917   CALCIUM 9.1 09/20/2014 1553   ALKPHOS 52 01/12/2015 0917   ALKPHOS 34* 09/20/2014 1553   AST 19 01/12/2015 0917   AST 18 09/20/2014 1553   ALT 20 01/12/2015 0917   ALT 11 09/20/2014 1553   BILITOT 0.36 01/12/2015 0917   BILITOT 0.2* 09/20/2014 1553       Lab Results  Component Value Date   WBC 3.6* 01/12/2015   HGB 11.2* 01/12/2015   HCT 33.2* 01/12/2015   MCV 88.6 01/12/2015   PLT 367 01/12/2015   NEUTROABS 1.5 01/12/2015    ASSESSMENT & PLAN:   Breast cancer of upper-outer quadrant of left female breast Left Mastectomy 09/23/14: IDC grade 2 with extracellular mucin 6.2 cm, 4 LN Neg, ER 97%, PR 98%, Her 2 Neg ratio 1.28, Ki 67 47% T3N0 Stage 2B  Treatment plan: adjuvant chemotherapy withDose dense Adriamycin and Cytoxan 4 followed by Abraxane weekly 12 followed by radiation and antiestrogen therapy, Adriamycin and Cytoxan started 10/20/2014 completed 12/01/2014  Current treatment: Today cycle 5/12 Abraxane. Toxicities of chemotherapy: 1. Severe fatigue 2. Chemotherapy-induced neutropenia grade 4: Decreased the dose of cycle 2 of chemotherapy 3. Nausea which improved with Compazine. Zofran caused migraines. We gave her Phenergan in addition to Compazine for when necessary nausea treatment. 4. Intermittent diarrhea: Improved with Imodium. 5. Leukocytosis as a result of Neulasta injection.  Return to clinic in 2 weeks for cycle 7/12 Abraxane.      No orders of the defined types were placed in this encounter.   The patient has a good understanding of the overall plan. she agrees with it. she will call with any problems that may develop before the next visit here.   Laurie Panda, NP

## 2015-01-19 ENCOUNTER — Other Ambulatory Visit (HOSPITAL_BASED_OUTPATIENT_CLINIC_OR_DEPARTMENT_OTHER): Payer: BLUE CROSS/BLUE SHIELD

## 2015-01-19 ENCOUNTER — Ambulatory Visit (HOSPITAL_BASED_OUTPATIENT_CLINIC_OR_DEPARTMENT_OTHER): Payer: BLUE CROSS/BLUE SHIELD

## 2015-01-19 VITALS — BP 130/79 | HR 73 | Temp 98.1°F | Resp 18

## 2015-01-19 DIAGNOSIS — C50812 Malignant neoplasm of overlapping sites of left female breast: Secondary | ICD-10-CM

## 2015-01-19 DIAGNOSIS — Z5111 Encounter for antineoplastic chemotherapy: Secondary | ICD-10-CM | POA: Diagnosis not present

## 2015-01-19 DIAGNOSIS — C50412 Malignant neoplasm of upper-outer quadrant of left female breast: Secondary | ICD-10-CM

## 2015-01-19 LAB — COMPREHENSIVE METABOLIC PANEL (CC13)
ALT: 17 U/L (ref 0–55)
ANION GAP: 6 meq/L (ref 3–11)
AST: 16 U/L (ref 5–34)
Albumin: 3.8 g/dL (ref 3.5–5.0)
Alkaline Phosphatase: 47 U/L (ref 40–150)
BUN: 8.5 mg/dL (ref 7.0–26.0)
CO2: 27 mEq/L (ref 22–29)
CREATININE: 0.7 mg/dL (ref 0.6–1.1)
Calcium: 9.1 mg/dL (ref 8.4–10.4)
Chloride: 104 mEq/L (ref 98–109)
EGFR: >90 mL/min/{1.73_m2} (ref >90)
Glucose: 117 mg/dl (ref 70–140)
POTASSIUM: 4 meq/L (ref 3.5–5.1)
Sodium: 137 mEq/L (ref 136–145)
Total Bilirubin: 0.41 mg/dL (ref 0.20–1.20)
Total Protein: 5.8 g/dL — ABNORMAL LOW (ref 6.4–8.3)

## 2015-01-19 LAB — CBC WITH DIFFERENTIAL/PLATELET
BASO%: 1.2 % (ref 0.0–2.0)
BASOS ABS: 0.1 10*3/uL (ref 0.0–0.1)
EOS%: 7.2 % — ABNORMAL HIGH (ref 0.0–7.0)
Eosinophils Absolute: 0.3 10*3/uL (ref 0.0–0.5)
HEMATOCRIT: 30.6 % — AB (ref 34.8–46.6)
HGB: 10.4 g/dL — ABNORMAL LOW (ref 11.6–15.9)
LYMPH%: 40.2 % (ref 14.0–49.7)
MCH: 30.3 pg (ref 25.1–34.0)
MCHC: 33.9 g/dL (ref 31.5–36.0)
MCV: 89.4 fL (ref 79.5–101.0)
MONO#: 0.5 10*3/uL (ref 0.1–0.9)
MONO%: 11.3 % (ref 0.0–14.0)
NEUT#: 1.6 10*3/uL (ref 1.5–6.5)
NEUT%: 40.1 % (ref 38.4–76.8)
Platelets: 346 10*3/uL (ref 145–400)
RBC: 3.42 10*6/uL — AB (ref 3.70–5.45)
RDW: 16.2 % — ABNORMAL HIGH (ref 11.2–14.5)
WBC: 4.1 10*3/uL (ref 3.9–10.3)
lymph#: 1.6 10*3/uL (ref 0.9–3.3)

## 2015-01-19 MED ORDER — SODIUM CHLORIDE 0.9 % IJ SOLN
10.0000 mL | INTRAMUSCULAR | Status: DC | PRN
  Administered 2015-01-19: 10 mL
  Filled 2015-01-19: qty 10

## 2015-01-19 MED ORDER — HEPARIN SOD (PORK) LOCK FLUSH 100 UNIT/ML IV SOLN
500.0000 [IU] | Freq: Once | INTRAVENOUS | Status: AC | PRN
  Administered 2015-01-19: 500 [IU]
  Filled 2015-01-19: qty 5

## 2015-01-19 MED ORDER — PALONOSETRON HCL INJECTION 0.25 MG/5ML
0.2500 mg | Freq: Once | INTRAVENOUS | Status: AC
  Administered 2015-01-19: 0.25 mg via INTRAVENOUS

## 2015-01-19 MED ORDER — PALONOSETRON HCL INJECTION 0.25 MG/5ML
INTRAVENOUS | Status: AC
  Filled 2015-01-19: qty 5

## 2015-01-19 MED ORDER — PACLITAXEL PROTEIN-BOUND CHEMO INJECTION 100 MG
80.0000 mg/m2 | Freq: Once | INTRAVENOUS | Status: AC
  Administered 2015-01-19: 150 mg via INTRAVENOUS
  Filled 2015-01-19: qty 30

## 2015-01-19 MED ORDER — SODIUM CHLORIDE 0.9 % IV SOLN
Freq: Once | INTRAVENOUS | Status: AC
  Administered 2015-01-19: 10:00:00 via INTRAVENOUS

## 2015-01-19 NOTE — Patient Instructions (Signed)
[facility] Discharge Instructions for Patients Receiving Chemotherapy  Today you received the following chemotherapy agents Abraxane To help prevent nausea and vomiting after your treatment, we encourage you to take your nausea medication as prescribed.   If you develop nausea and vomiting that is not controlled by your nausea medication, call the clinic.   BELOW ARE SYMPTOMS THAT SHOULD BE REPORTED IMMEDIATELY:  *FEVER GREATER THAN 100.5 F  *CHILLS WITH OR WITHOUT FEVER  NAUSEA AND VOMITING THAT IS NOT CONTROLLED WITH YOUR NAUSEA MEDICATION  *UNUSUAL SHORTNESS OF BREATH  *UNUSUAL BRUISING OR BLEEDING  TENDERNESS IN MOUTH AND THROAT WITH OR WITHOUT PRESENCE OF ULCERS  *URINARY PROBLEMS  *BOWEL PROBLEMS  UNUSUAL RASH Items with * indicate a potential emergency and should be followed up as soon as possible.  Feel free to call the clinic you have any questions or concerns. The clinic phone number is [phone].  Please show the CHEMO ALERT CARD at check-in to the Emergency Department and triage nurse.   

## 2015-01-25 ENCOUNTER — Other Ambulatory Visit: Payer: Self-pay | Admitting: Physician Assistant

## 2015-01-26 ENCOUNTER — Ambulatory Visit (HOSPITAL_BASED_OUTPATIENT_CLINIC_OR_DEPARTMENT_OTHER): Payer: BLUE CROSS/BLUE SHIELD | Admitting: Nurse Practitioner

## 2015-01-26 ENCOUNTER — Ambulatory Visit (HOSPITAL_BASED_OUTPATIENT_CLINIC_OR_DEPARTMENT_OTHER): Payer: BLUE CROSS/BLUE SHIELD

## 2015-01-26 ENCOUNTER — Telehealth: Payer: Self-pay | Admitting: Nurse Practitioner

## 2015-01-26 ENCOUNTER — Other Ambulatory Visit (HOSPITAL_BASED_OUTPATIENT_CLINIC_OR_DEPARTMENT_OTHER): Payer: BLUE CROSS/BLUE SHIELD

## 2015-01-26 ENCOUNTER — Encounter: Payer: Self-pay | Admitting: Nurse Practitioner

## 2015-01-26 VITALS — BP 130/76 | HR 80 | Temp 98.8°F | Resp 18 | Ht 66.0 in | Wt 158.7 lb

## 2015-01-26 DIAGNOSIS — C50412 Malignant neoplasm of upper-outer quadrant of left female breast: Secondary | ICD-10-CM

## 2015-01-26 DIAGNOSIS — Z5111 Encounter for antineoplastic chemotherapy: Secondary | ICD-10-CM

## 2015-01-26 DIAGNOSIS — C50812 Malignant neoplasm of overlapping sites of left female breast: Secondary | ICD-10-CM | POA: Diagnosis not present

## 2015-01-26 DIAGNOSIS — R110 Nausea: Secondary | ICD-10-CM | POA: Diagnosis not present

## 2015-01-26 DIAGNOSIS — R5383 Other fatigue: Secondary | ICD-10-CM

## 2015-01-26 DIAGNOSIS — D701 Agranulocytosis secondary to cancer chemotherapy: Secondary | ICD-10-CM | POA: Diagnosis not present

## 2015-01-26 DIAGNOSIS — D72829 Elevated white blood cell count, unspecified: Secondary | ICD-10-CM | POA: Diagnosis not present

## 2015-01-26 DIAGNOSIS — R197 Diarrhea, unspecified: Secondary | ICD-10-CM

## 2015-01-26 LAB — COMPREHENSIVE METABOLIC PANEL (CC13)
ALBUMIN: 3.8 g/dL (ref 3.5–5.0)
ALT: 19 U/L (ref 0–55)
AST: 18 U/L (ref 5–34)
Alkaline Phosphatase: 50 U/L (ref 40–150)
Anion Gap: 6 mEq/L (ref 3–11)
BUN: 4.8 mg/dL — ABNORMAL LOW (ref 7.0–26.0)
CALCIUM: 9 mg/dL (ref 8.4–10.4)
CO2: 28 mEq/L (ref 22–29)
CREATININE: 0.6 mg/dL (ref 0.6–1.1)
Chloride: 107 mEq/L (ref 98–109)
EGFR: >90 mL/min/{1.73_m2} (ref >90)
GLUCOSE: 108 mg/dL (ref 70–140)
Potassium: 4 mEq/L (ref 3.5–5.1)
Sodium: 141 mEq/L (ref 136–145)
Total Bilirubin: 0.37 mg/dL (ref 0.20–1.20)
Total Protein: 5.9 g/dL — ABNORMAL LOW (ref 6.4–8.3)

## 2015-01-26 LAB — CBC WITH DIFFERENTIAL/PLATELET
BASO%: 0.9 % (ref 0.0–2.0)
BASOS ABS: 0 10*3/uL (ref 0.0–0.1)
EOS%: 4 % (ref 0.0–7.0)
Eosinophils Absolute: 0.2 10*3/uL (ref 0.0–0.5)
HEMATOCRIT: 32.7 % — AB (ref 34.8–46.6)
HEMOGLOBIN: 11 g/dL — AB (ref 11.6–15.9)
LYMPH%: 35.5 % (ref 14.0–49.7)
MCH: 29.9 pg (ref 25.1–34.0)
MCHC: 33.6 g/dL (ref 31.5–36.0)
MCV: 89.1 fL (ref 79.5–101.0)
MONO#: 0.5 10*3/uL (ref 0.1–0.9)
MONO%: 9.4 % (ref 0.0–14.0)
NEUT#: 2.7 10*3/uL (ref 1.5–6.5)
NEUT%: 50.2 % (ref 38.4–76.8)
PLATELETS: 453 10*3/uL — AB (ref 145–400)
RBC: 3.67 10*6/uL — AB (ref 3.70–5.45)
RDW: 15.1 % — ABNORMAL HIGH (ref 11.2–14.5)
WBC: 5.3 10*3/uL (ref 3.9–10.3)
lymph#: 1.9 10*3/uL (ref 0.9–3.3)

## 2015-01-26 MED ORDER — PACLITAXEL PROTEIN-BOUND CHEMO INJECTION 100 MG
80.0000 mg/m2 | Freq: Once | INTRAVENOUS | Status: AC
  Administered 2015-01-26: 150 mg via INTRAVENOUS
  Filled 2015-01-26: qty 30

## 2015-01-26 MED ORDER — SODIUM CHLORIDE 0.9 % IJ SOLN
10.0000 mL | INTRAMUSCULAR | Status: DC | PRN
  Administered 2015-01-26: 10 mL
  Filled 2015-01-26: qty 10

## 2015-01-26 MED ORDER — SODIUM CHLORIDE 0.9 % IV SOLN
Freq: Once | INTRAVENOUS | Status: AC
  Administered 2015-01-26: 12:00:00 via INTRAVENOUS

## 2015-01-26 MED ORDER — PALONOSETRON HCL INJECTION 0.25 MG/5ML
0.2500 mg | Freq: Once | INTRAVENOUS | Status: AC
  Administered 2015-01-26: 0.25 mg via INTRAVENOUS

## 2015-01-26 MED ORDER — PALONOSETRON HCL INJECTION 0.25 MG/5ML
INTRAVENOUS | Status: AC
Start: 2015-01-26 — End: 2015-01-26
  Filled 2015-01-26: qty 5

## 2015-01-26 MED ORDER — HEPARIN SOD (PORK) LOCK FLUSH 100 UNIT/ML IV SOLN
500.0000 [IU] | Freq: Once | INTRAVENOUS | Status: AC | PRN
  Administered 2015-01-26: 500 [IU]
  Filled 2015-01-26: qty 5

## 2015-01-26 NOTE — Progress Notes (Signed)
Patient Care Team: Leandrew Koyanagi, MD as PCP - General (Family Medicine) Fanny Skates, MD as Consulting Physician (General Surgery) Nicholas Lose, MD as Consulting Physician (Hematology and Oncology) Arloa Koh, MD as Consulting Physician (Radiation Oncology) Mauro Kaufmann, RN as Registered Nurse Rockwell Germany, RN as Registered Nurse Holley Bouche, NP as Nurse Practitioner (Nurse Practitioner)  DIAGNOSIS: Breast cancer of upper-outer quadrant of left female breast   Staging form: Breast, AJCC 7th Edition     Clinical stage from 09/14/2014: Stage IIB (T3, N0, M0) - Unsigned     Pathologic stage from 09/26/2014: Stage IIB (T3, N0, cM0) - Signed by Seward Grater, MD on 10/04/2014       Staging comments: Staged on final mastectomy by Dr. Avis Epley.    SUMMARY OF ONCOLOGIC HISTORY:   Breast cancer of upper-outer quadrant of left female breast   09/06/2014 Initial Diagnosis Left breast 3:00 biopsy: Invasive ductal carcinoma with abundant extracellular mucin, ER 97%, PR 98%, Ki-67 47%, HER-2 negative ratio 1.28   09/12/2014 Breast MRI Left breast: 6.5 x 5.3 x 5.1 cm irregular enhancing mass with 2 adjacent 8 mm satellite masses, no lymphadenopathy   09/23/2014 Surgery Left Mastectomy: IDC grade 2 with extracellular mucin 6.2 cm, 4 LN Neg, ER 97%, PR 98%, Her 2 Neg ratio 1.28, Ki 67 47% T3N0 Stage 2B   10/20/2014 -  Chemotherapy Adjuvant chemotherapy with dose since Adriamycin and Cytoxan 4 followed by Abraxane weekly 12    CHIEF COMPLIANT: Week 7/12 Abraxane  INTERVAL HISTORY: Amy [name] is a 64 year old with above-mentioned history of left-sided breast cancer currently on adjuvant chemotherapy. She is currently on week 7 of abraxane. She continues to tolerate treatment well. She denies fevers or chills. She has the "usual" nausea, well managed with PRN antiemetics. She had diarrhea just one time in the past week. She is eating and drinking well. Her appetite is fair. Her mouth  tenderness is relieved with Magic Mouthwash use. She recently received injections to her feet, but because of her chemo treatments her orthopedic specialist used a different drug to avoid cortisone. She has not yet felt the effects of the injections but she is hopeful.  REVIEW OF SYSTEMS:   Constitutional: Denies fevers, chills or abnormal weight loss Eyes: Denies blurriness of vision Ears, nose, mouth, throat, and face: denies sore throat, occasional mucositis  Respiratory: Denies cough, dyspnea or wheezes Cardiovascular: Denies palpitation, chest discomfort or lower extremity swelling Gastrointestinal: heartburn or change in bowel habits, occasional nausea Skin: Denies abnormal skin rashes Lymphatics: Denies new lymphadenopathy or easy bruising Neurological:Denies numbness, tingling or new weaknesses Behavioral/Psych: Mood is stable, no new changes  Breast:  denies any pain or lumps or nodules in either breasts All other systems were reviewed with the patient and are negative.  I have reviewed the past medical history, past surgical history, social history and family history with the patient and they are unchanged from previous note.  ALLERGIES:  is allergic to codeine; tramadol; darvocet; gold-containing drug products; and sulfa antibiotics.  MEDICATIONS:  Current Outpatient Prescriptions  Medication Sig Dispense Refill  . ALPRAZolam (XANAX) 0.25 MG tablet TAKE 1 TABLET TWICE A DAY AS NEEDED 60 tablet 0  . cetirizine (ZYRTEC) 10 MG tablet Take 10 mg by mouth daily.    . citalopram (CELEXA) 40 MG tablet TAKE 1 TABLET (40 MG TOTAL) BY MOUTH DAILY. 90 tablet 3  . dexamethasone (DECADRON) 4 MG tablet   1  . HYDROcodone-acetaminophen (NORCO) 5-325  MG per tablet Take 1 tablet by mouth every 4 (four) hours as needed. For pain    . lidocaine-prilocaine (EMLA) cream   2  . lisinopril (PRINIVIL,ZESTRIL) 5 MG tablet Take 1 tablet (5 mg total) by mouth daily. 90 tablet 3  . metoprolol succinate  (TOPROL-XL) 100 MG 24 hr tablet Take 1 tablet (100 mg total) by mouth daily. Take with or immediately following a meal. 90 tablet 3  . nystatin (MYCOSTATIN) 100000 UNIT/ML suspension   1  . prochlorperazine (COMPAZINE) 10 MG tablet Take 1 tablet (10 mg total) by mouth every 6 (six) hours as needed for nausea or vomiting. 30 tablet 1  . promethazine (PHENERGAN) 25 MG tablet Take 1 tablet (25 mg total) by mouth every 6 (six) hours as needed for nausea or vomiting. 30 tablet 1  . UNABLE TO FIND 1 each by Other route once. 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 verapamil (VERELAN PM) 180 MG 24 hr capsule TAKE 1 CAPSULE (180 MG TOTAL) BY MOUTH 2 (TWO) TIMES DAILY. 180 capsule 3  . zolpidem (AMBIEN) 10 MG tablet TAKE 1 TABLET AT BEDTIME 30 tablet 0  . Alum & Mag Hydroxide-Simeth (MAGIC MOUTHWASH W/LIDOCAINE) SOLN 5-22m four times a day as needed for mouth sores. Swish, gargle, and spit. (Patient not taking: Reported on 01/26/2015) 240 mL 2  . LORazepam (ATIVAN) 0.5 MG tablet Take 1 tablet (0.5 mg total) by mouth every 6 (six) hours as needed (Nausea or vomiting). (Patient not taking: Reported on 01/12/2015) 30 tablet 5  . phenylephrine (SUDAFED PE) 10 MG TABS tablet Take 10 mg by mouth daily as needed (allergies).    . SUMAtriptan (IMITREX) 100 MG tablet Take 100 mg by mouth every 2 (two) hours as needed for migraine. May repeat in 2 hours if headache persists or recurs.     No current facility-administered medications for this visit.    PHYSICAL EXAMINATION: ECOG PERFORMANCE STATUS: 1 - Symptomatic but completely ambulatory  Filed Vitals:   01/26/15 1110  BP: 130/76  Pulse: 80  Temp: 98.8 F (37.1 C)  Resp: 18   Filed Weights   01/26/15 1110  Weight: 158 lb 11.2 oz (71.986 kg)    GENERAL:alert, no distress and comfortable SKIN: skin color, texture, turgor are normal, no rashes or significant lesions EYES: normal,  Conjunctiva are pink and non-injected, sclera clear OROPHARYNX:no exudate, no erythema and lips, buccal mucosa, and tongue normal  NECK: supple, thyroid normal size, non-tender, without nodularity LYMPH:  no palpable lymphadenopathy in the cervical, axillary or inguinal LUNGS: clear to auscultation and percussion with normal breathing effort HEART: regular rate & rhythm and no murmurs and no lower extremity edema ABDOMEN:abdomen soft, non-tender and normal bowel sounds Musculoskeletal:no cyanosis of digits and no clubbing  NEURO: alert & oriented x 3 with fluent speech, no focal motor/sensory deficits  LABORATORY DATA:  I have reviewed the data as listed   Chemistry      Component Value Date/Time   NA 141 01/26/2015 1033   NA 137 09/20/2014 1553   K 4.0 01/26/2015 1033   K 3.7 09/20/2014 1553   CL 104 09/20/2014 1553   CO2 28 01/26/2015 1033   CO2 21 09/20/2014 1553   BUN 4.8* 01/26/2015 1033   BUN 8 09/20/2014 1553   CREATININE 0.6 01/26/2015 1033   CREATININE 0.68 09/23/2014 1920   CREATININE 0.56 08/16/2014 1208      Component  Value Date/Time   CALCIUM 9.0 01/26/2015 1033   CALCIUM 9.1 09/20/2014 1553   ALKPHOS 50 01/26/2015 1033   ALKPHOS 34* 09/20/2014 1553   AST 18 01/26/2015 1033   AST 18 09/20/2014 1553   ALT 19 01/26/2015 1033   ALT 11 09/20/2014 1553   BILITOT 0.37 01/26/2015 1033   BILITOT 0.2* 09/20/2014 1553       Lab Results  Component Value Date   WBC 5.3 01/26/2015   HGB 11.0* 01/26/2015   HCT 32.7* 01/26/2015   MCV 89.1 01/26/2015   PLT 453* 01/26/2015   NEUTROABS 2.7 01/26/2015    ASSESSMENT & PLAN:  No problem-specific assessment & plan notes found for this encounter.   No orders of the defined types were placed in this encounter.   The patient has a good understanding of the overall plan. she agrees with it. she will call with any problems that may develop before the next visit here.   Laurie Panda, NP

## 2015-01-26 NOTE — Assessment & Plan Note (Signed)
Left Mastectomy 09/23/14: IDC grade 2 with extracellular mucin 6.2 cm, 4 LN Neg, ER 97%, PR 98%, Her 2 Neg ratio 1.28, Ki 67 47% T3N0 Stage 2B  Treatment plan: adjuvant chemotherapy withDose dense Adriamycin and Cytoxan 4 followed by Abraxane weekly 12 followed by radiation and antiestrogen therapy, Adriamycin and Cytoxan started 10/20/2014 completed 12/01/2014  Current treatment: Today cycle 7/12 Abraxane. Toxicities of chemotherapy: 1. Severe fatigue 2. Chemotherapy-induced neutropenia grade 4: Decreased the dose of cycle 2 of chemotherapy 3. Nausea which improved with Compazine. Zofran caused migraines. We gave her Phenergan in addition to Compazine for when necessary nausea treatment. 4. Intermittent diarrhea: Improved with Imodium. 5. Leukocytosis as a result of Neulasta injection.  Return to clinic in 2 weeks for cycle 9/12 Abraxane.

## 2015-01-26 NOTE — Telephone Encounter (Signed)
Appointments made per pof and patient will get a new avs in chemo today

## 2015-01-27 ENCOUNTER — Encounter: Payer: Self-pay | Admitting: *Deleted

## 2015-01-31 NOTE — Telephone Encounter (Signed)
Faxed

## 2015-02-01 ENCOUNTER — Other Ambulatory Visit: Payer: Self-pay | Admitting: Hematology and Oncology

## 2015-02-01 DIAGNOSIS — C50412 Malignant neoplasm of upper-outer quadrant of left female breast: Secondary | ICD-10-CM

## 2015-02-02 ENCOUNTER — Other Ambulatory Visit (HOSPITAL_BASED_OUTPATIENT_CLINIC_OR_DEPARTMENT_OTHER): Payer: BLUE CROSS/BLUE SHIELD

## 2015-02-02 ENCOUNTER — Ambulatory Visit (HOSPITAL_BASED_OUTPATIENT_CLINIC_OR_DEPARTMENT_OTHER): Payer: BLUE CROSS/BLUE SHIELD

## 2015-02-02 VITALS — BP 132/69 | HR 72 | Temp 98.3°F | Resp 20

## 2015-02-02 DIAGNOSIS — C50412 Malignant neoplasm of upper-outer quadrant of left female breast: Secondary | ICD-10-CM

## 2015-02-02 DIAGNOSIS — C50812 Malignant neoplasm of overlapping sites of left female breast: Secondary | ICD-10-CM

## 2015-02-02 DIAGNOSIS — Z5111 Encounter for antineoplastic chemotherapy: Secondary | ICD-10-CM | POA: Diagnosis not present

## 2015-02-02 LAB — COMPREHENSIVE METABOLIC PANEL (CC13)
ALK PHOS: 52 U/L (ref 40–150)
ALT: 13 U/L (ref 0–55)
AST: 15 U/L (ref 5–34)
Albumin: 3.9 g/dL (ref 3.5–5.0)
Anion Gap: 6 mEq/L (ref 3–11)
BUN: 4.6 mg/dL — AB (ref 7.0–26.0)
CALCIUM: 8.8 mg/dL (ref 8.4–10.4)
CO2: 26 mEq/L (ref 22–29)
CREATININE: 0.7 mg/dL (ref 0.6–1.1)
Chloride: 105 mEq/L (ref 98–109)
Glucose: 108 mg/dl (ref 70–140)
Potassium: 3.9 mEq/L (ref 3.5–5.1)
Sodium: 138 mEq/L (ref 136–145)
Total Bilirubin: 0.3 mg/dL (ref 0.20–1.20)
Total Protein: 6 g/dL — ABNORMAL LOW (ref 6.4–8.3)

## 2015-02-02 LAB — CBC WITH DIFFERENTIAL/PLATELET
BASO%: 1.9 % (ref 0.0–2.0)
BASOS ABS: 0.1 10*3/uL (ref 0.0–0.1)
EOS%: 4.4 % (ref 0.0–7.0)
Eosinophils Absolute: 0.2 10*3/uL (ref 0.0–0.5)
HCT: 33.2 % — ABNORMAL LOW (ref 34.8–46.6)
HGB: 11.1 g/dL — ABNORMAL LOW (ref 11.6–15.9)
LYMPH#: 1.9 10*3/uL (ref 0.9–3.3)
LYMPH%: 44.8 % (ref 14.0–49.7)
MCH: 30.2 pg (ref 25.1–34.0)
MCHC: 33.4 g/dL (ref 31.5–36.0)
MCV: 90.5 fL (ref 79.5–101.0)
MONO#: 0.5 10*3/uL (ref 0.1–0.9)
MONO%: 10.9 % (ref 0.0–14.0)
NEUT#: 1.6 10*3/uL (ref 1.5–6.5)
NEUT%: 38 % — ABNORMAL LOW (ref 38.4–76.8)
Platelets: 432 10*3/uL — ABNORMAL HIGH (ref 145–400)
RBC: 3.67 10*6/uL — AB (ref 3.70–5.45)
RDW: 13.8 % (ref 11.2–14.5)
WBC: 4.1 10*3/uL (ref 3.9–10.3)

## 2015-02-02 MED ORDER — PALONOSETRON HCL INJECTION 0.25 MG/5ML
0.2500 mg | Freq: Once | INTRAVENOUS | Status: AC
  Administered 2015-02-02: 0.25 mg via INTRAVENOUS

## 2015-02-02 MED ORDER — PACLITAXEL PROTEIN-BOUND CHEMO INJECTION 100 MG
80.0000 mg/m2 | Freq: Once | INTRAVENOUS | Status: AC
  Administered 2015-02-02: 150 mg via INTRAVENOUS
  Filled 2015-02-02: qty 30

## 2015-02-02 MED ORDER — SODIUM CHLORIDE 0.9 % IV SOLN
Freq: Once | INTRAVENOUS | Status: AC
  Administered 2015-02-02: 10:00:00 via INTRAVENOUS

## 2015-02-02 MED ORDER — SODIUM CHLORIDE 0.9 % IJ SOLN
10.0000 mL | INTRAMUSCULAR | Status: AC | PRN
  Administered 2015-02-02: 10 mL
  Filled 2015-02-02: qty 10

## 2015-02-02 MED ORDER — PALONOSETRON HCL INJECTION 0.25 MG/5ML
INTRAVENOUS | Status: AC
  Filled 2015-02-02: qty 5

## 2015-02-02 MED ORDER — HEPARIN SOD (PORK) LOCK FLUSH 100 UNIT/ML IV SOLN
500.0000 [IU] | INTRAVENOUS | Status: AC | PRN
  Administered 2015-02-02: 500 [IU]
  Filled 2015-02-02: qty 5

## 2015-02-02 NOTE — Patient Instructions (Signed)
[facility] Discharge Instructions for Patients Receiving Chemotherapy  Today you received the following chemotherapy agents:  Abraxane  To help prevent nausea and vomiting after your treatment, we encourage you to take your nausea medication as ordered per MD.   If you develop nausea and vomiting that is not controlled by your nausea medication, call the clinic.   BELOW ARE SYMPTOMS THAT SHOULD BE REPORTED IMMEDIATELY:  *FEVER GREATER THAN 100.5 F  *CHILLS WITH OR WITHOUT FEVER  NAUSEA AND VOMITING THAT IS NOT CONTROLLED WITH YOUR NAUSEA MEDICATION  *UNUSUAL SHORTNESS OF BREATH  *UNUSUAL BRUISING OR BLEEDING  TENDERNESS IN MOUTH AND THROAT WITH OR WITHOUT PRESENCE OF ULCERS  *URINARY PROBLEMS  *BOWEL PROBLEMS  UNUSUAL RASH Items with * indicate a potential emergency and should be followed up as soon as possible.  Feel free to call the clinic you have any questions or concerns. The clinic phone number is [phone].  Please show the CHEMO ALERT CARD at check-in to the Emergency Department and triage nurse.   

## 2015-02-07 ENCOUNTER — Other Ambulatory Visit: Payer: Self-pay | Admitting: Physician Assistant

## 2015-02-08 NOTE — Assessment & Plan Note (Signed)
Left Mastectomy 09/23/14: IDC grade 2 with extracellular mucin 6.2 cm, 4 LN Neg, ER 97%, PR 98%, Her 2 Neg ratio 1.28, Ki 67 47% T3N0 Stage 2B  Treatment plan: adjuvant chemotherapy withDose dense Adriamycin and Cytoxan 4 followed by Abraxane weekly 12 followed by radiation and antiestrogen therapy, Adriamycin and Cytoxan started 10/20/2014 completed 12/01/2014  Current treatment: Today cycle 9/12 Abraxane. Toxicities of chemotherapy: 1. Severe fatigue 2. Chemotherapy-induced neutropenia grade 4: Decreased the dose of cycle 2 of chemotherapy 3. Nausea which improved with Compazine. Zofran caused migraines. We gave her Phenergan in addition to Compazine for when necessary nausea treatment. 4. Intermittent diarrhea: Improved with Imodium. 5. Leukocytosis as a result of Neulasta injection.  Return to clinic in 2 weeks for cycle 11/12 Abraxane

## 2015-02-09 ENCOUNTER — Telehealth: Payer: Self-pay | Admitting: Hematology and Oncology

## 2015-02-09 ENCOUNTER — Other Ambulatory Visit (HOSPITAL_BASED_OUTPATIENT_CLINIC_OR_DEPARTMENT_OTHER): Payer: BLUE CROSS/BLUE SHIELD

## 2015-02-09 ENCOUNTER — Ambulatory Visit (HOSPITAL_BASED_OUTPATIENT_CLINIC_OR_DEPARTMENT_OTHER): Payer: BLUE CROSS/BLUE SHIELD

## 2015-02-09 ENCOUNTER — Encounter: Payer: Self-pay | Admitting: Hematology and Oncology

## 2015-02-09 ENCOUNTER — Ambulatory Visit (HOSPITAL_BASED_OUTPATIENT_CLINIC_OR_DEPARTMENT_OTHER): Payer: Medicare Other | Admitting: Hematology and Oncology

## 2015-02-09 VITALS — BP 137/86 | HR 77 | Temp 98.1°F | Resp 18 | Ht 66.0 in | Wt 159.6 lb

## 2015-02-09 DIAGNOSIS — R530 Neoplastic (malignant) related fatigue: Secondary | ICD-10-CM | POA: Diagnosis not present

## 2015-02-09 DIAGNOSIS — C50812 Malignant neoplasm of overlapping sites of left female breast: Secondary | ICD-10-CM

## 2015-02-09 DIAGNOSIS — D72829 Elevated white blood cell count, unspecified: Secondary | ICD-10-CM | POA: Diagnosis not present

## 2015-02-09 DIAGNOSIS — C50412 Malignant neoplasm of upper-outer quadrant of left female breast: Secondary | ICD-10-CM

## 2015-02-09 DIAGNOSIS — D701 Agranulocytosis secondary to cancer chemotherapy: Secondary | ICD-10-CM | POA: Diagnosis not present

## 2015-02-09 DIAGNOSIS — Z5111 Encounter for antineoplastic chemotherapy: Secondary | ICD-10-CM

## 2015-02-09 LAB — CBC WITH DIFFERENTIAL/PLATELET
BASO%: 1 % (ref 0.0–2.0)
BASOS ABS: 0.1 10*3/uL (ref 0.0–0.1)
EOS%: 5.8 % (ref 0.0–7.0)
Eosinophils Absolute: 0.3 10*3/uL (ref 0.0–0.5)
HEMATOCRIT: 34.1 % — AB (ref 34.8–46.6)
HEMOGLOBIN: 11.3 g/dL — AB (ref 11.6–15.9)
LYMPH%: 40 % (ref 14.0–49.7)
MCH: 30.1 pg (ref 25.1–34.0)
MCHC: 33.1 g/dL (ref 31.5–36.0)
MCV: 90.9 fL (ref 79.5–101.0)
MONO#: 0.5 10*3/uL (ref 0.1–0.9)
MONO%: 9.3 % (ref 0.0–14.0)
NEUT%: 43.9 % (ref 38.4–76.8)
NEUTROS ABS: 2.3 10*3/uL (ref 1.5–6.5)
PLATELETS: 438 10*3/uL — AB (ref 145–400)
RBC: 3.75 10*6/uL (ref 3.70–5.45)
RDW: 13.6 % (ref 11.2–14.5)
WBC: 5.2 10*3/uL (ref 3.9–10.3)
lymph#: 2.1 10*3/uL (ref 0.9–3.3)

## 2015-02-09 LAB — COMPREHENSIVE METABOLIC PANEL (CC13)
ALT: 14 U/L (ref 0–55)
AST: 16 U/L (ref 5–34)
Albumin: 3.9 g/dL (ref 3.5–5.0)
Alkaline Phosphatase: 51 U/L (ref 40–150)
Anion Gap: 7 mEq/L (ref 3–11)
BILIRUBIN TOTAL: 0.34 mg/dL (ref 0.20–1.20)
BUN: 9.5 mg/dL (ref 7.0–26.0)
CALCIUM: 8.9 mg/dL (ref 8.4–10.4)
CO2: 25 mEq/L (ref 22–29)
Chloride: 106 mEq/L (ref 98–109)
Creatinine: 0.7 mg/dL (ref 0.6–1.1)
EGFR: >90 mL/min/{1.73_m2} (ref >90)
Glucose: 121 mg/dl (ref 70–140)
Potassium: 4.5 mEq/L (ref 3.5–5.1)
Sodium: 138 mEq/L (ref 136–145)
Total Protein: 6.1 g/dL — ABNORMAL LOW (ref 6.4–8.3)

## 2015-02-09 MED ORDER — HEPARIN SOD (PORK) LOCK FLUSH 100 UNIT/ML IV SOLN
500.0000 [IU] | Freq: Once | INTRAVENOUS | Status: AC | PRN
  Administered 2015-02-09: 500 [IU]
  Filled 2015-02-09: qty 5

## 2015-02-09 MED ORDER — SODIUM CHLORIDE 0.9 % IV SOLN
Freq: Once | INTRAVENOUS | Status: AC
  Administered 2015-02-09: 11:00:00 via INTRAVENOUS

## 2015-02-09 MED ORDER — PALONOSETRON HCL INJECTION 0.25 MG/5ML
0.2500 mg | Freq: Once | INTRAVENOUS | Status: AC
Start: 2015-02-09 — End: 2015-02-09
  Administered 2015-02-09: 0.25 mg via INTRAVENOUS

## 2015-02-09 MED ORDER — PALONOSETRON HCL INJECTION 0.25 MG/5ML
INTRAVENOUS | Status: AC
  Filled 2015-02-09: qty 5

## 2015-02-09 MED ORDER — SODIUM CHLORIDE 0.9 % IJ SOLN
10.0000 mL | INTRAMUSCULAR | Status: DC | PRN
  Administered 2015-02-09: 10 mL
  Filled 2015-02-09: qty 10

## 2015-02-09 MED ORDER — PACLITAXEL PROTEIN-BOUND CHEMO INJECTION 100 MG
80.0000 mg/m2 | Freq: Once | INTRAVENOUS | Status: AC
  Administered 2015-02-09: 150 mg via INTRAVENOUS
  Filled 2015-02-09: qty 30

## 2015-02-09 NOTE — Telephone Encounter (Signed)
md visit added to 7/21 and she will get a new avs in chemo   Amy [name]

## 2015-02-09 NOTE — Patient Instructions (Addendum)
[facility] Discharge Instructions for Patients Receiving Chemotherapy  Today you received the following chemotherapy agents:  Abraxane  To help prevent nausea and vomiting after your treatment, we encourage you to take your nausea medication as ordered per MD.   If you develop nausea and vomiting that is not controlled by your nausea medication, call the clinic.   BELOW ARE SYMPTOMS THAT SHOULD BE REPORTED IMMEDIATELY:  *FEVER GREATER THAN 100.5 F  *CHILLS WITH OR WITHOUT FEVER  NAUSEA AND VOMITING THAT IS NOT CONTROLLED WITH YOUR NAUSEA MEDICATION  *UNUSUAL SHORTNESS OF BREATH  *UNUSUAL BRUISING OR BLEEDING  TENDERNESS IN MOUTH AND THROAT WITH OR WITHOUT PRESENCE OF ULCERS  *URINARY PROBLEMS  *BOWEL PROBLEMS  UNUSUAL RASH Items with * indicate a potential emergency and should be followed up as soon as possible.  Feel free to call the clinic you have any questions or concerns. The clinic phone number is [phone].  Please show the CHEMO ALERT CARD at check-in to the Emergency Department and triage nurse.   

## 2015-02-09 NOTE — Telephone Encounter (Signed)
Faxed

## 2015-02-09 NOTE — Progress Notes (Signed)
Patient Care Team: Leandrew Koyanagi, MD as PCP - General (Family Medicine) Fanny Skates, MD as Consulting Physician (General Surgery) Nicholas Lose, MD as Consulting Physician (Hematology and Oncology) Arloa Koh, MD as Consulting Physician (Radiation Oncology) Mauro Kaufmann, RN as Registered Nurse Rockwell Germany, RN as Registered Nurse Holley Bouche, NP as Nurse Practitioner (Nurse Practitioner)  DIAGNOSIS: Breast cancer of upper-outer quadrant of left female breast   Staging form: Breast, AJCC 7th Edition     Clinical stage from 09/14/2014: Stage IIB (T3, N0, M0) - Unsigned     Pathologic stage from 09/26/2014: Stage IIB (T3, N0, cM0) - Signed by Seward Grater, MD on 10/04/2014       Staging comments: Staged on final mastectomy by Dr. Avis Epley.    SUMMARY OF ONCOLOGIC HISTORY:   Breast cancer of upper-outer quadrant of left female breast   09/06/2014 Initial Diagnosis Left breast 3:00 biopsy: Invasive ductal carcinoma with abundant extracellular mucin, ER 97%, PR 98%, Ki-67 47%, HER-2 negative ratio 1.28   09/12/2014 Breast MRI Left breast: 6.5 x 5.3 x 5.1 cm irregular enhancing mass with 2 adjacent 8 mm satellite masses, no lymphadenopathy   09/23/2014 Surgery Left Mastectomy: IDC grade 2 with extracellular mucin 6.2 cm, 4 LN Neg, ER 97%, PR 98%, Her 2 Neg ratio 1.28, Ki 67 47% T3N0 Stage 2B   10/20/2014 -  Chemotherapy Adjuvant chemotherapy with dose since Adriamycin and Cytoxan 4 followed by Abraxane weekly 12    CHIEF COMPLIANT: Abraxane 9/12  INTERVAL HISTORY: Amy [name] is a 64 year old with above-mentioned history of left breast cancer status post mastectomy currently on adjuvant chemotherapy. Today is week 9 of Abraxane. She is tolerating it extremely well without any major problems or concerns. Does not have any nausea. Diarrhea has resolved. Nausea is very mild and she takes Phenergan intermittently. Denies neuropathy.  REVIEW OF SYSTEMS:   Constitutional:  Denies fevers, chills or abnormal weight loss Eyes: Denies blurriness of vision Ears, nose, mouth, throat, and face: Denies mucositis or sore throat Respiratory: Denies cough, dyspnea or wheezes Cardiovascular: Denies palpitation, chest discomfort or lower extremity swelling Gastrointestinal:  Denies nausea, heartburn or change in bowel habits Skin: Denies abnormal skin rashes Lymphatics: Denies new lymphadenopathy or easy bruising Neurological:Denies numbness, tingling or new weaknesses Behavioral/Psych: Mood is stable, no new changes  All other systems were reviewed with the patient and are negative.  I have reviewed the past medical history, past surgical history, social history and family history with the patient and they are unchanged from previous note.  ALLERGIES:  is allergic to codeine; tramadol; darvocet; gold-containing drug products; and sulfa antibiotics.  MEDICATIONS:  Current Outpatient Prescriptions  Medication Sig Dispense Refill  . ALPRAZolam (XANAX) 0.25 MG tablet TAKE 1 TABLET TWICE A DAY AS NEEDED 60 tablet 0  . Alum & Mag Hydroxide-Simeth (MAGIC MOUTHWASH W/LIDOCAINE) SOLN 5-42m four times a day as needed for mouth sores. Swish, gargle, and spit. 240 mL 2  . cetirizine (ZYRTEC) 10 MG tablet Take 10 mg by mouth daily.    . citalopram (CELEXA) 40 MG tablet TAKE 1 TABLET (40 MG TOTAL) BY MOUTH DAILY. 90 tablet 3  . dexamethasone (DECADRON) 4 MG tablet   1  . HYDROcodone-acetaminophen (NORCO) 5-325 MG per tablet Take 1 tablet by mouth every 4 (four) hours as needed. For pain    . lidocaine-prilocaine (EMLA) cream   2  . lisinopril (PRINIVIL,ZESTRIL) 5 MG tablet Take 1 tablet (5 mg total) by mouth  daily. 90 tablet 3  . LORazepam (ATIVAN) 0.5 MG tablet Take 1 tablet (0.5 mg total) by mouth every 6 (six) hours as needed (Nausea or vomiting). 30 tablet 5  . metoprolol succinate (TOPROL-XL) 100 MG 24 hr tablet Take 1 tablet (100 mg total) by mouth daily. Take with or  immediately following a meal. 90 tablet 3  . nystatin (MYCOSTATIN) 100000 UNIT/ML suspension   1  . phenylephrine (SUDAFED PE) 10 MG TABS tablet Take 10 mg by mouth daily as needed (allergies).    . prochlorperazine (COMPAZINE) 10 MG tablet Take 1 tablet (10 mg total) by mouth every 6 (six) hours as needed for nausea or vomiting. 30 tablet 1  . promethazine (PHENERGAN) 25 MG tablet TAKE 1 TABLET (25 MG TOTAL) BY MOUTH EVERY 6 (SIX) HOURS AS NEEDED FOR NAUSEA OR VOMITING. 30 tablet 1  . SUMAtriptan (IMITREX) 100 MG tablet Take 100 mg by mouth every 2 (two) hours as needed for migraine. May repeat in 2 hours if headache persists or recurs.    Marland Kitchen UNABLE TO FIND 1 each by Other route once. 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 verapamil (VERELAN PM) 180 MG 24 hr capsule TAKE 1 CAPSULE (180 MG TOTAL) BY MOUTH 2 (TWO) TIMES DAILY. 180 capsule 3  . zolpidem (AMBIEN) 10 MG tablet TAKE 1 TAB AT BEDTIME 30 tablet 0   No current facility-administered medications for this visit.    PHYSICAL EXAMINATION: ECOG PERFORMANCE STATUS: 1 - Symptomatic but completely ambulatory  Filed Vitals:   02/09/15 0931  BP: 137/86  Pulse: 77  Temp: 98.1 F (36.7 C)  Resp: 18   Filed Weights   02/09/15 0931  Weight: 159 lb 9.6 oz (72.394 kg)    GENERAL:alert, no distress and comfortable SKIN: skin color, texture, turgor are normal, no rashes or significant lesions EYES: normal, Conjunctiva are pink and non-injected, sclera clear OROPHARYNX:no exudate, no erythema and lips, buccal mucosa, and tongue normal  NECK: supple, thyroid normal size, non-tender, without nodularity LYMPH:  no palpable lymphadenopathy in the cervical, axillary or inguinal LUNGS: clear to auscultation and percussion with normal breathing effort HEART: regular rate & rhythm and no murmurs and no lower extremity edema ABDOMEN:abdomen soft, non-tender and normal bowel  sounds Musculoskeletal:no cyanosis of digits and no clubbing  NEURO: alert & oriented x 3 with fluent speech, no focal motor/sensory deficits  LABORATORY DATA:  I have reviewed the data as listed   Chemistry      Component Value Date/Time   NA 138 02/02/2015 0912   NA 137 09/20/2014 1553   K 3.9 02/02/2015 0912   K 3.7 09/20/2014 1553   CL 104 09/20/2014 1553   CO2 26 02/02/2015 0912   CO2 21 09/20/2014 1553   BUN 4.6* 02/02/2015 0912   BUN 8 09/20/2014 1553   CREATININE 0.7 02/02/2015 0912   CREATININE 0.68 09/23/2014 1920   CREATININE 0.56 08/16/2014 1208      Component Value Date/Time   CALCIUM 8.8 02/02/2015 0912   CALCIUM 9.1 09/20/2014 1553   ALKPHOS 52 02/02/2015 0912   ALKPHOS 34* 09/20/2014 1553   AST 15 02/02/2015 0912   AST 18 09/20/2014 1553   ALT 13 02/02/2015 0912   ALT 11 09/20/2014 1553   BILITOT 0.30 02/02/2015 0912   BILITOT 0.2* 09/20/2014 1553       Lab Results  Component Value Date   WBC 5.2 02/09/2015  HGB 11.3* 02/09/2015   HCT 34.1* 02/09/2015   MCV 90.9 02/09/2015   PLT 438* 02/09/2015   NEUTROABS 2.3 02/09/2015     ASSESSMENT & PLAN:  Breast cancer of upper-outer quadrant of left female breast Left Mastectomy 09/23/14: IDC grade 2 with extracellular mucin 6.2 cm, 4 LN Neg, ER 97%, PR 98%, Her 2 Neg ratio 1.28, Ki 67 47% T3N0 Stage 2B  Treatment plan: adjuvant chemotherapy withDose dense Adriamycin and Cytoxan 4 followed by Abraxane weekly 12 followed by radiation and antiestrogen therapy, Adriamycin and Cytoxan started 10/20/2014 completed 12/01/2014  Current treatment: Today cycle 9/12 Abraxane. Toxicities of chemotherapy: 1. Severe fatigue 2. Chemotherapy-induced neutropenia grade 4: Decreased the dose of cycle 2 of chemotherapy 3. Nausea which improved with Compazine. Zofran caused migraines. We gave her Phenergan in addition to Compazine for when necessary nausea treatment. 4. Intermittent diarrhea: Improved with Imodium. 5.  Leukocytosis as a result of Neulasta injection during Adriamycin and Cytoxan.  Patient does not have neuropathy Return to clinic in 3 weeks for cycle 12/12 Abraxane Patient plans to have breast reconstruction and will meet with Dr. Harlow Mares after finishing with chemotherapy.  No orders of the defined types were placed in this encounter.   The patient has a good understanding of the overall plan. she agrees with it. she will call with any problems that may develop before the next visit here.   Rulon Eisenmenger, MD

## 2015-02-14 ENCOUNTER — Ambulatory Visit: Payer: Medicare Other | Admitting: Physician Assistant

## 2015-02-16 ENCOUNTER — Ambulatory Visit (HOSPITAL_BASED_OUTPATIENT_CLINIC_OR_DEPARTMENT_OTHER): Payer: BLUE CROSS/BLUE SHIELD

## 2015-02-16 ENCOUNTER — Other Ambulatory Visit (HOSPITAL_BASED_OUTPATIENT_CLINIC_OR_DEPARTMENT_OTHER): Payer: BLUE CROSS/BLUE SHIELD

## 2015-02-16 ENCOUNTER — Encounter (HOSPITAL_COMMUNITY): Payer: Self-pay

## 2015-02-16 ENCOUNTER — Other Ambulatory Visit: Payer: Self-pay | Admitting: Hematology and Oncology

## 2015-02-16 VITALS — BP 127/74 | HR 74 | Temp 98.8°F | Resp 18

## 2015-02-16 DIAGNOSIS — Z5111 Encounter for antineoplastic chemotherapy: Secondary | ICD-10-CM | POA: Diagnosis not present

## 2015-02-16 DIAGNOSIS — C50812 Malignant neoplasm of overlapping sites of left female breast: Secondary | ICD-10-CM

## 2015-02-16 DIAGNOSIS — C50412 Malignant neoplasm of upper-outer quadrant of left female breast: Secondary | ICD-10-CM

## 2015-02-16 LAB — CBC WITH DIFFERENTIAL/PLATELET
BASO%: 1.5 % (ref 0.0–2.0)
Basophils Absolute: 0.1 10*3/uL (ref 0.0–0.1)
EOS%: 6.2 % (ref 0.0–7.0)
Eosinophils Absolute: 0.2 10*3/uL (ref 0.0–0.5)
HCT: 34.3 % — ABNORMAL LOW (ref 34.8–46.6)
HGB: 11.4 g/dL — ABNORMAL LOW (ref 11.6–15.9)
LYMPH%: 46.9 % (ref 14.0–49.7)
MCH: 29.9 pg (ref 25.1–34.0)
MCHC: 33.2 g/dL (ref 31.5–36.0)
MCV: 90 fL (ref 79.5–101.0)
MONO#: 0.5 10*3/uL (ref 0.1–0.9)
MONO%: 12.1 % (ref 0.0–14.0)
NEUT#: 1.3 10*3/uL — ABNORMAL LOW (ref 1.5–6.5)
NEUT%: 33.3 % — ABNORMAL LOW (ref 38.4–76.8)
Platelets: 427 10*3/uL — ABNORMAL HIGH (ref 145–400)
RBC: 3.81 10*6/uL (ref 3.70–5.45)
RDW: 13.3 % (ref 11.2–14.5)
WBC: 3.9 10*3/uL (ref 3.9–10.3)
lymph#: 1.8 10*3/uL (ref 0.9–3.3)

## 2015-02-16 LAB — COMPREHENSIVE METABOLIC PANEL (CC13)
ALK PHOS: 48 U/L (ref 40–150)
ALT: 14 U/L (ref 0–55)
AST: 16 U/L (ref 5–34)
Albumin: 4 g/dL (ref 3.5–5.0)
Anion Gap: 8 mEq/L (ref 3–11)
BUN: 9.5 mg/dL (ref 7.0–26.0)
CALCIUM: 9.6 mg/dL (ref 8.4–10.4)
CHLORIDE: 105 meq/L (ref 98–109)
CO2: 26 mEq/L (ref 22–29)
CREATININE: 0.7 mg/dL (ref 0.6–1.1)
GLUCOSE: 93 mg/dL (ref 70–140)
POTASSIUM: 4.2 meq/L (ref 3.5–5.1)
Sodium: 138 mEq/L (ref 136–145)
Total Bilirubin: 0.33 mg/dL (ref 0.20–1.20)
Total Protein: 6.1 g/dL — ABNORMAL LOW (ref 6.4–8.3)

## 2015-02-16 MED ORDER — SODIUM CHLORIDE 0.9 % IJ SOLN
10.0000 mL | INTRAMUSCULAR | Status: DC | PRN
  Administered 2015-02-16: 10 mL
  Filled 2015-02-16: qty 10

## 2015-02-16 MED ORDER — SODIUM CHLORIDE 0.9 % IV SOLN
250.0000 mL | Freq: Once | INTRAVENOUS | Status: AC
  Administered 2015-02-16: 250 mL via INTRAVENOUS

## 2015-02-16 MED ORDER — PACLITAXEL PROTEIN-BOUND CHEMO INJECTION 100 MG
65.0000 mg/m2 | Freq: Once | INTRAVENOUS | Status: AC
  Administered 2015-02-16: 125 mg via INTRAVENOUS
  Filled 2015-02-16: qty 25

## 2015-02-16 MED ORDER — PALONOSETRON HCL INJECTION 0.25 MG/5ML
INTRAVENOUS | Status: AC
  Filled 2015-02-16: qty 5

## 2015-02-16 MED ORDER — HEPARIN SOD (PORK) LOCK FLUSH 100 UNIT/ML IV SOLN
500.0000 [IU] | Freq: Once | INTRAVENOUS | Status: AC | PRN
  Administered 2015-02-16: 500 [IU]
  Filled 2015-02-16: qty 5

## 2015-02-16 MED ORDER — PALONOSETRON HCL INJECTION 0.25 MG/5ML
0.2500 mg | Freq: Once | INTRAVENOUS | Status: AC
  Administered 2015-02-16: 0.25 mg via INTRAVENOUS

## 2015-02-16 NOTE — Progress Notes (Signed)
Okay to treat today with ANC: 1.3 per Dr. Lindi Adie; Abraxane dose reduced per MD.

## 2015-02-16 NOTE — Patient Instructions (Signed)
[facility] Discharge Instructions for Patients Receiving Chemotherapy  Today you received the following chemotherapy agents: Abraxane   To help prevent nausea and vomiting after your treatment, we encourage you to take your nausea medication as directed.    If you develop nausea and vomiting that is not controlled by your nausea medication, call the clinic.   BELOW ARE SYMPTOMS THAT SHOULD BE REPORTED IMMEDIATELY:  *FEVER GREATER THAN 100.5 F  *CHILLS WITH OR WITHOUT FEVER  NAUSEA AND VOMITING THAT IS NOT CONTROLLED WITH YOUR NAUSEA MEDICATION  *UNUSUAL SHORTNESS OF BREATH  *UNUSUAL BRUISING OR BLEEDING  TENDERNESS IN MOUTH AND THROAT WITH OR WITHOUT PRESENCE OF ULCERS  *URINARY PROBLEMS  *BOWEL PROBLEMS  UNUSUAL RASH Items with * indicate a potential emergency and should be followed up as soon as possible.  Feel free to call the clinic you have any questions or concerns. The clinic phone number is [phone].  Please show the CHEMO ALERT CARD at check-in to the Emergency Department and triage nurse.   

## 2015-02-22 NOTE — Assessment & Plan Note (Signed)
Left Mastectomy 09/23/14: IDC grade 2 with extracellular mucin 6.2 cm, 4 LN Neg, ER 97%, PR 98%, Her 2 Neg ratio 1.28, Ki 67 47% T3N0 Stage 2B  Treatment plan: adjuvant chemotherapy withDose dense Adriamycin and Cytoxan 4 followed by Abraxane weekly 12 followed by radiation and antiestrogen therapy, Adriamycin and Cytoxan started 10/20/2014 completed 12/01/2014  Current treatment: Today cycle 12/12 Abraxane. Toxicities of chemotherapy: 1. Severe fatigue 2. Chemotherapy-induced neutropenia grade 4: Decreased the dose of cycle 2 of chemotherapy 3. Nausea which improved with Compazine. Zofran caused migraines. We gave her Phenergan in addition to Compazine for when necessary nausea treatment. 4. Intermittent diarrhea: Improved with Imodium. 5. Leukocytosis as a result of Neulasta injection during Adriamycin and Cytoxan.  Patient does not have neuropathy Patient plans to have breast reconstruction and will meet with Dr. Harlow Mares after finishing with chemotherapy.  Plan: 1.  Adjuvant radiation therapy:  We will send a request for radiation consultation 2.  Followed by antiestrogen therapy

## 2015-02-23 ENCOUNTER — Encounter: Payer: Self-pay | Admitting: *Deleted

## 2015-02-23 ENCOUNTER — Ambulatory Visit (HOSPITAL_BASED_OUTPATIENT_CLINIC_OR_DEPARTMENT_OTHER): Payer: BLUE CROSS/BLUE SHIELD

## 2015-02-23 ENCOUNTER — Other Ambulatory Visit (HOSPITAL_BASED_OUTPATIENT_CLINIC_OR_DEPARTMENT_OTHER): Payer: BLUE CROSS/BLUE SHIELD

## 2015-02-23 ENCOUNTER — Encounter: Payer: Medicare Other | Admitting: Hematology and Oncology

## 2015-02-23 VITALS — Temp 98.2°F

## 2015-02-23 VITALS — BP 122/76 | HR 84 | Temp 99.6°F | Resp 18 | Ht 66.0 in | Wt 162.3 lb

## 2015-02-23 DIAGNOSIS — C50812 Malignant neoplasm of overlapping sites of left female breast: Secondary | ICD-10-CM

## 2015-02-23 DIAGNOSIS — Z5111 Encounter for antineoplastic chemotherapy: Secondary | ICD-10-CM

## 2015-02-23 DIAGNOSIS — C50412 Malignant neoplasm of upper-outer quadrant of left female breast: Secondary | ICD-10-CM

## 2015-02-23 LAB — COMPREHENSIVE METABOLIC PANEL (CC13)
ALK PHOS: 47 U/L (ref 40–150)
ALT: 11 U/L (ref 0–55)
AST: 14 U/L (ref 5–34)
Albumin: 3.8 g/dL (ref 3.5–5.0)
Anion Gap: 7 mEq/L (ref 3–11)
BUN: 7.8 mg/dL (ref 7.0–26.0)
CO2: 26 meq/L (ref 22–29)
CREATININE: 0.7 mg/dL (ref 0.6–1.1)
Calcium: 9 mg/dL (ref 8.4–10.4)
Chloride: 106 mEq/L (ref 98–109)
GLUCOSE: 117 mg/dL (ref 70–140)
Potassium: 4.1 mEq/L (ref 3.5–5.1)
SODIUM: 139 meq/L (ref 136–145)
TOTAL PROTEIN: 5.9 g/dL — AB (ref 6.4–8.3)
Total Bilirubin: 0.25 mg/dL (ref 0.20–1.20)

## 2015-02-23 LAB — CBC WITH DIFFERENTIAL/PLATELET
BASO%: 1.6 % (ref 0.0–2.0)
BASOS ABS: 0.1 10*3/uL (ref 0.0–0.1)
EOS ABS: 0.3 10*3/uL (ref 0.0–0.5)
EOS%: 7.3 % — ABNORMAL HIGH (ref 0.0–7.0)
HCT: 34.2 % — ABNORMAL LOW (ref 34.8–46.6)
HEMOGLOBIN: 11.3 g/dL — AB (ref 11.6–15.9)
LYMPH#: 2 10*3/uL (ref 0.9–3.3)
LYMPH%: 44.6 % (ref 14.0–49.7)
MCH: 29.6 pg (ref 25.1–34.0)
MCHC: 33 g/dL (ref 31.5–36.0)
MCV: 89.5 fL (ref 79.5–101.0)
MONO#: 0.5 10*3/uL (ref 0.1–0.9)
MONO%: 11.2 % (ref 0.0–14.0)
NEUT#: 1.5 10*3/uL (ref 1.5–6.5)
NEUT%: 35.3 % — ABNORMAL LOW (ref 38.4–76.8)
PLATELETS: 463 10*3/uL — AB (ref 145–400)
RBC: 3.82 10*6/uL (ref 3.70–5.45)
RDW: 13.5 % (ref 11.2–14.5)
WBC: 4.4 10*3/uL (ref 3.9–10.3)
nRBC: 0 % (ref 0–0)

## 2015-02-23 MED ORDER — PALONOSETRON HCL INJECTION 0.25 MG/5ML
0.2500 mg | Freq: Once | INTRAVENOUS | Status: AC
  Administered 2015-02-23: 0.25 mg via INTRAVENOUS

## 2015-02-23 MED ORDER — PACLITAXEL PROTEIN-BOUND CHEMO INJECTION 100 MG
65.0000 mg/m2 | Freq: Once | INTRAVENOUS | Status: AC
  Administered 2015-02-23: 125 mg via INTRAVENOUS
  Filled 2015-02-23: qty 25

## 2015-02-23 MED ORDER — HEPARIN SOD (PORK) LOCK FLUSH 100 UNIT/ML IV SOLN
500.0000 [IU] | Freq: Once | INTRAVENOUS | Status: AC | PRN
  Administered 2015-02-23: 500 [IU]
  Filled 2015-02-23: qty 5

## 2015-02-23 MED ORDER — PALONOSETRON HCL INJECTION 0.25 MG/5ML
INTRAVENOUS | Status: AC
  Filled 2015-02-23: qty 5

## 2015-02-23 MED ORDER — SODIUM CHLORIDE 0.9 % IV SOLN
Freq: Once | INTRAVENOUS | Status: AC
  Administered 2015-02-23: 10:00:00 via INTRAVENOUS

## 2015-02-23 MED ORDER — SODIUM CHLORIDE 0.9 % IJ SOLN
10.0000 mL | INTRAMUSCULAR | Status: DC | PRN
  Administered 2015-02-23: 10 mL
  Filled 2015-02-23: qty 10

## 2015-02-23 NOTE — Patient Instructions (Signed)
[facility] Discharge Instructions for Patients Receiving Chemotherapy  Today you received the following chemotherapy agents: Abraxane   To help prevent nausea and vomiting after your treatment, we encourage you to take your nausea medication as directed.    If you develop nausea and vomiting that is not controlled by your nausea medication, call the clinic.   BELOW ARE SYMPTOMS THAT SHOULD BE REPORTED IMMEDIATELY:  *FEVER GREATER THAN 100.5 F  *CHILLS WITH OR WITHOUT FEVER  NAUSEA AND VOMITING THAT IS NOT CONTROLLED WITH YOUR NAUSEA MEDICATION  *UNUSUAL SHORTNESS OF BREATH  *UNUSUAL BRUISING OR BLEEDING  TENDERNESS IN MOUTH AND THROAT WITH OR WITHOUT PRESENCE OF ULCERS  *URINARY PROBLEMS  *BOWEL PROBLEMS  UNUSUAL RASH Items with * indicate a potential emergency and should be followed up as soon as possible.  Feel free to call the clinic you have any questions or concerns. The clinic phone number is [phone].  Please show the CHEMO ALERT CARD at check-in to the Emergency Department and triage nurse.   

## 2015-02-23 NOTE — Progress Notes (Signed)
Oncology Nurse Navigator Documentation  Oncology Nurse Navigator Flowsheets 01/27/2015 02/23/2015  Navigator Encounter Type Treatment Treatment  Patient Visit Type Medonc -  Treatment Phase Treatment -  Barriers/Navigation Needs No barriers at this time No barriers at this time  Time Spent with Patient (Retired) [place] with patient today.  She states that her appointment with Dr. Lindi Adie was supposed to be cancelled today and he will see her at her last chemo.  Appointment cancelled and she is aware of her appointments for next week when she will receive her last chemo.

## 2015-02-28 ENCOUNTER — Ambulatory Visit (INDEPENDENT_AMBULATORY_CARE_PROVIDER_SITE_OTHER): Payer: BLUE CROSS/BLUE SHIELD | Admitting: Physician Assistant

## 2015-02-28 ENCOUNTER — Encounter: Payer: Self-pay | Admitting: Physician Assistant

## 2015-02-28 VITALS — BP 127/81 | HR 81 | Temp 98.4°F | Resp 16 | Ht 66.0 in | Wt 160.0 lb

## 2015-02-28 DIAGNOSIS — F418 Other specified anxiety disorders: Secondary | ICD-10-CM | POA: Diagnosis not present

## 2015-02-28 DIAGNOSIS — F329 Major depressive disorder, single episode, unspecified: Secondary | ICD-10-CM

## 2015-02-28 DIAGNOSIS — F419 Anxiety disorder, unspecified: Secondary | ICD-10-CM

## 2015-02-28 DIAGNOSIS — I10 Essential (primary) hypertension: Secondary | ICD-10-CM | POA: Diagnosis not present

## 2015-02-28 DIAGNOSIS — C50412 Malignant neoplasm of upper-outer quadrant of left female breast: Secondary | ICD-10-CM

## 2015-02-28 DIAGNOSIS — F32A Depression, unspecified: Secondary | ICD-10-CM

## 2015-02-28 MED ORDER — ZOLPIDEM TARTRATE 10 MG PO TABS: ORAL_TABLET | ORAL | Status: DC

## 2015-02-28 MED ORDER — ALPRAZOLAM 0.25 MG PO TABS: 0.2500 mg | ORAL_TABLET | Freq: Two times a day (BID) | ORAL | Status: DC | PRN

## 2015-02-28 NOTE — Progress Notes (Signed)
   Subjective:    Patient ID: Amy [name], female    DOB: [birth year], 64 y.o.   MRN: [id number]  HPI Patient with PMH of HTN, insomnia, and breast CA presenting for a medication refill. Currently taking metoprolol 100 mg, verapamil 180 mg, lisinopril 5 mg, Citalopram 40 mg, and zolpidem 10 mg.  Pt is scheduled to receive her last round of chemotherapy for her breast cancer of the left breast this month. She experiences fatigue and nausea due to chemotherapy but denies and new onset HA, visual changes, and dizziness. The pt has no concerns regarding her HTN or medication regimen at this time.   Review of Systems  Constitutional: Positive for fatigue (chemotherapy). Negative for fever, chills, diaphoresis, activity change and appetite change.  Eyes: Negative for visual disturbance.  Respiratory: Negative for cough, chest tightness and shortness of breath.   Cardiovascular: Negative for chest pain and palpitations.  Gastrointestinal: Negative for abdominal pain.  Skin: Negative for color change, pallor, rash and wound.  Neurological: Negative for light-headedness and headaches.       Objective:   Physical Exam  Constitutional: She is oriented to person, place, and time. She appears well-developed and well-nourished. No distress.  BP 127/81 mmHg  Pulse 81  Temp(Src) 98.4 F (36.9 C)  Resp 16  Ht 5\' 6"  (1.676 m)  Wt 160 lb (72.576 kg)  BMI 25.84 kg/m2  Neck: Normal range of motion. Neck supple. No tracheal deviation present. No thyromegaly present.  Cardiovascular: Normal rate, regular rhythm, normal heart sounds and intact distal pulses.  Exam reveals no gallop and no friction rub.   No murmur heard. Pulmonary/Chest: Effort normal and breath sounds normal. No respiratory distress. She has no rales.  Musculoskeletal: Normal range of motion. She exhibits no edema.  Neurological: She is alert and oriented to person, place, and time.  Skin: Skin is warm and dry. No rash noted. She  is not diaphoretic. No erythema. No pallor.  Psychiatric: She has a normal mood and affect. Her behavior is normal. Judgment and thought content normal.           Assessment & Plan:  1. Essential hypertension - controlled, continue current treatment regimen.  2. Anxiety and depression - zolpidem (AMBIEN) 10 MG tablet; TAKE 1 TAB AT BEDTIME  Dispense: 30 tablet; Refill: 0 - ALPRAZolam (XANAX) 0.25 MG tablet; Take 1 tablet (0.25 mg total) by mouth 2 (two) times daily as needed.  Dispense: 60 tablet; Refill: 0  3. Breast cancer of upper-outer quadrant of left female breast - CBC with Differential  - Comprehensive metabolic panel - pt scheduled to receive her last dose of chemotherapy 03/02/2015

## 2015-02-28 NOTE — Patient Instructions (Signed)
Ask to have HIV screening done when you next have your blood drawn.

## 2015-02-28 NOTE — Progress Notes (Signed)
Patient ID: Amy [name], female    DOB: June 01, 1951, 64 y.o.   MRN: [id number]  PCP: Leandrew Koyanagi, MD  Subjective:   Chief Complaint  Patient presents with  . Medication Refill    pended    HPI Presents for medication refills (zolpidem and alprazolam).  Currently taking metoprolol 100 mg, verapamil 180 mg, lisinopril 5 mg, Citalopram 40 mg, and zolpidem 10 mg. Pt is scheduled to receive her last round of chemotherapy for her breast cancer of the left breast this month. She experiences fatigue and nausea due to chemotherapy but denies and new onset HA, visual changes, and dizziness. The pt has no concerns regarding her HTN or medication regimen at this time.    Review of Systems Constitutional: Positive for fatigue (chemotherapy). Negative for fever, chills, diaphoresis, activity change and appetite change.  Eyes: Negative for visual disturbance.  Respiratory: Negative for cough, chest tightness and shortness of breath.  Cardiovascular: Negative for chest pain and palpitations.  Gastrointestinal: Negative for abdominal pain.  Skin: Negative for color change, pallor, rash and wound.  Neurological: Negative for light-headedness and headaches.      Patient Active Problem List   Diagnosis Date Noted  . Breast cancer of upper-outer quadrant of left female breast 09/09/2014  . HTN (hypertension) 12/24/2012  . Migraine 12/24/2012  . GERD (gastroesophageal reflux disease) 12/24/2012  . Hearing loss in left ear 12/24/2012  . 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 Insomnia 12/24/2012  . Diverticulosis 12/24/2012  . Personal history of colonic polyps 12/24/2012  . Hyperlipidemia 12/24/2012  . BCC (basal cell carcinoma), arm 12/24/2012     Prior to Admission medications   Medication Sig Start Date End Date Taking? Authorizing Provider  ALPRAZolam (XANAX) 0.25 MG tablet Take 1 tablet (0.25 mg total) by mouth 2 (two) times daily as needed. 02/28/15  Yes Dakari Cregger, PA-C  Alum  & Mag Hydroxide-Simeth (MAGIC MOUTHWASH W/LIDOCAINE) SOLN 5-19ml four times a day as needed for mouth sores. Swish, gargle, and spit. 12/22/14  Yes Nicholas Lose, MD  cetirizine (ZYRTEC) 10 MG tablet Take 10 mg by mouth daily.   Yes Historical Provider, MD  citalopram (CELEXA) 40 MG tablet TAKE 1 TABLET (40 MG TOTAL) BY MOUTH DAILY. 08/16/14  Yes Nicholson Starace, PA-C  dexamethasone (DECADRON) 4 MG tablet  10/11/14  Yes Historical Provider, MD  HYDROcodone-acetaminophen (NORCO) 5-325 MG per tablet Take 1 tablet by mouth every 4 (four) hours as needed. For pain 01/23/12  Yes Historical Provider, MD  lidocaine-prilocaine (EMLA) cream  01/08/15  Yes Historical Provider, MD  lisinopril (PRINIVIL,ZESTRIL) 5 MG tablet Take 1 tablet (5 mg total) by mouth daily. 08/16/14  Yes Merida Alcantar, PA-C  LORazepam (ATIVAN) 0.5 MG tablet Take 1 tablet (0.5 mg total) by mouth every 6 (six) hours as needed (Nausea or vomiting). 12/15/14  Yes Nicholas Lose, MD  metoprolol succinate (TOPROL-XL) 100 MG 24 hr tablet Take 1 tablet (100 mg total) by mouth daily. Take with or immediately following a meal. 08/16/14  Yes Machel Violante, PA-C  nystatin (MYCOSTATIN) 100000 UNIT/ML suspension  12/27/14  Yes Historical Provider, MD  phenylephrine (SUDAFED PE) 10 MG TABS tablet Take 10 mg by mouth daily as needed (allergies).   Yes Historical Provider, MD  prochlorperazine (COMPAZINE) 10 MG tablet Take 1 tablet (10 mg total) by mouth every 6 (six) hours as needed for nausea or vomiting. 12/29/14  Yes Nicholas Lose, MD  promethazine (PHENERGAN) 25 MG tablet TAKE 1 TABLET (25 MG TOTAL) BY  MOUTH EVERY 6 (SIX) HOURS AS NEEDED FOR NAUSEA OR VOMITING. 02/01/15  Yes Nicholas Lose, MD  SUMAtriptan (IMITREX) 100 MG tablet Take 100 mg by mouth every 2 (two) hours as needed for migraine. May repeat in 2 hours if headache persists or recurs.   Yes Historical Provider, MD  UNABLE TO FIND 1 each by Other route once. 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 08/16/14  Yes Leeandra Ellerson, PA-C  zolpidem (AMBIEN) 10 MG tablet TAKE 1 TAB AT BEDTIME 02/28/15  Yes Socorro Kanitz, PA-C     Allergies  Allergen Reactions  . Codeine Nausea And Vomiting  . Tramadol Other (See Comments)    Chest tightness  . Darvocet [Propoxyphene N-Acetaminophen] Other (See Comments)    pruritis  . Gold-Containing Drug Products   . Sulfa Antibiotics Rash       Objective:  Physical Exam  Constitutional: She is oriented to person, place, and time. She appears well-developed and well-nourished. No distress.  BP 127/81 mmHg  Pulse 81  Temp(Src) 98.4 F (36.9 C)  Resp 16  Ht 5\' 6"  (1.676 m)  Wt 160 lb (72.576 kg)  BMI 25.84 kg/m2   Eyes: Conjunctivae are normal. No scleral icterus.  Neck: No thyromegaly present.  Cardiovascular: Normal rate, regular rhythm, normal heart sounds and intact distal pulses.   Pulmonary/Chest: Effort normal and breath sounds normal.  Lymphadenopathy:    She has no cervical adenopathy.  Neurological: She is alert and oriented to person, place, and time.  Skin: Skin is warm and dry.  Psychiatric: She has a normal mood and affect. Her behavior is normal.           Assessment & Plan:   1. Essential hypertension Well controlled. Continue current treatment.  2. Anxiety and depression Controlled. Continue citalopram. Continue prn alprazolam and zolpidem. - zolpidem (AMBIEN) 10 MG tablet; TAKE 1 TAB AT BEDTIME  Dispense: 30 tablet; Refill: 0 - ALPRAZolam (XANAX) 0.25 MG tablet; Take 1 tablet (0.25 mg total) by mouth 2 (two) times daily as needed.  Dispense: 60 tablet; Refill: 0  3. Breast cancer of upper-outer quadrant of left female breast Proceed with treatment prescribed by oncology/radiation oncology.   Fara Chute,  PA-C Physician Assistant-Certified [facility]

## 2015-03-01 NOTE — Assessment & Plan Note (Signed)
Left Mastectomy 09/23/14: IDC grade 2 with extracellular mucin 6.2 cm, 4 LN Neg, ER 97%, PR 98%, Her 2 Neg ratio 1.28, Ki 67 47% T3N0 Stage 2B  Treatment plan: adjuvant chemotherapy withDose dense Adriamycin and Cytoxan 4 followed by Abraxane weekly 12 followed by radiation and antiestrogen therapy, Adriamycin and Cytoxan started 10/20/2014 completed 12/01/2014  Current treatment: Today cycle 12/12 Abraxane. Toxicities of chemotherapy: 1. Severe fatigue 2. Chemotherapy-induced neutropenia grade 4: Decreased the dose of cycle 2 of chemotherapy 3. Nausea which improved with Compazine. Zofran caused migraines. We gave her Phenergan in addition to Compazine for when necessary nausea treatment. 4. Intermittent diarrhea: Improved with Imodium. 5. Leukocytosis as a result of Neulasta injection during Adriamycin and Cytoxan.  Patient does not have neuropathy  Plan: 1. Consult Dr.Murray for adjuvant XRT 2. RTC after XRT to start adjuvant Anti-estrogen therapy Patient plans to have breast reconstruction and will meet with Dr. Harlow Mares.

## 2015-03-02 ENCOUNTER — Ambulatory Visit (HOSPITAL_BASED_OUTPATIENT_CLINIC_OR_DEPARTMENT_OTHER): Payer: BLUE CROSS/BLUE SHIELD

## 2015-03-02 ENCOUNTER — Ambulatory Visit (HOSPITAL_BASED_OUTPATIENT_CLINIC_OR_DEPARTMENT_OTHER): Payer: BLUE CROSS/BLUE SHIELD | Admitting: Hematology and Oncology

## 2015-03-02 ENCOUNTER — Other Ambulatory Visit (HOSPITAL_BASED_OUTPATIENT_CLINIC_OR_DEPARTMENT_OTHER): Payer: BLUE CROSS/BLUE SHIELD

## 2015-03-02 ENCOUNTER — Encounter: Payer: Self-pay | Admitting: *Deleted

## 2015-03-02 ENCOUNTER — Encounter: Payer: Self-pay | Admitting: Hematology and Oncology

## 2015-03-02 ENCOUNTER — Telehealth: Payer: Self-pay | Admitting: Hematology and Oncology

## 2015-03-02 ENCOUNTER — Other Ambulatory Visit: Payer: BLUE CROSS/BLUE SHIELD

## 2015-03-02 VITALS — BP 142/73 | HR 96 | Temp 98.2°F | Resp 18 | Ht 66.0 in | Wt 160.2 lb

## 2015-03-02 DIAGNOSIS — D701 Agranulocytosis secondary to cancer chemotherapy: Secondary | ICD-10-CM

## 2015-03-02 DIAGNOSIS — C50412 Malignant neoplasm of upper-outer quadrant of left female breast: Secondary | ICD-10-CM

## 2015-03-02 DIAGNOSIS — D72829 Elevated white blood cell count, unspecified: Secondary | ICD-10-CM | POA: Diagnosis not present

## 2015-03-02 DIAGNOSIS — Z5111 Encounter for antineoplastic chemotherapy: Secondary | ICD-10-CM

## 2015-03-02 DIAGNOSIS — C50812 Malignant neoplasm of overlapping sites of left female breast: Secondary | ICD-10-CM

## 2015-03-02 DIAGNOSIS — R110 Nausea: Secondary | ICD-10-CM | POA: Diagnosis not present

## 2015-03-02 DIAGNOSIS — R197 Diarrhea, unspecified: Secondary | ICD-10-CM

## 2015-03-02 LAB — CBC WITH DIFFERENTIAL/PLATELET
BASO%: 1.4 % (ref 0.0–2.0)
Basophils Absolute: 0.1 10*3/uL (ref 0.0–0.1)
EOS%: 4.1 % (ref 0.0–7.0)
Eosinophils Absolute: 0.2 10*3/uL (ref 0.0–0.5)
HCT: 34.9 % (ref 34.8–46.6)
HEMOGLOBIN: 11.3 g/dL — AB (ref 11.6–15.9)
LYMPH%: 44.8 % (ref 14.0–49.7)
MCH: 29 pg (ref 25.1–34.0)
MCHC: 32.4 g/dL (ref 31.5–36.0)
MCV: 89.5 fL (ref 79.5–101.0)
MONO#: 0.5 10*3/uL (ref 0.1–0.9)
MONO%: 10.8 % (ref 0.0–14.0)
NEUT#: 1.7 10*3/uL (ref 1.5–6.5)
NEUT%: 38.9 % (ref 38.4–76.8)
PLATELETS: 435 10*3/uL — AB (ref 145–400)
RBC: 3.9 10*6/uL (ref 3.70–5.45)
RDW: 13.5 % (ref 11.2–14.5)
WBC: 4.4 10*3/uL (ref 3.9–10.3)
lymph#: 2 10*3/uL (ref 0.9–3.3)

## 2015-03-02 LAB — COMPREHENSIVE METABOLIC PANEL (CC13)
ALT: 14 U/L (ref 0–55)
ANION GAP: 6 meq/L (ref 3–11)
AST: 17 U/L (ref 5–34)
Albumin: 3.9 g/dL (ref 3.5–5.0)
Alkaline Phosphatase: 49 U/L (ref 40–150)
BUN: 4.6 mg/dL — AB (ref 7.0–26.0)
CALCIUM: 8.9 mg/dL (ref 8.4–10.4)
CO2: 27 mEq/L (ref 22–29)
CREATININE: 0.7 mg/dL (ref 0.6–1.1)
Chloride: 106 mEq/L (ref 98–109)
GLUCOSE: 121 mg/dL (ref 70–140)
POTASSIUM: 4.1 meq/L (ref 3.5–5.1)
SODIUM: 139 meq/L (ref 136–145)
TOTAL PROTEIN: 6.1 g/dL — AB (ref 6.4–8.3)
Total Bilirubin: 0.3 mg/dL (ref 0.20–1.20)

## 2015-03-02 MED ORDER — HEPARIN SOD (PORK) LOCK FLUSH 100 UNIT/ML IV SOLN
500.0000 [IU] | Freq: Once | INTRAVENOUS | Status: AC | PRN
  Administered 2015-03-02: 500 [IU]
  Filled 2015-03-02: qty 5

## 2015-03-02 MED ORDER — PACLITAXEL PROTEIN-BOUND CHEMO INJECTION 100 MG
65.0000 mg/m2 | Freq: Once | INTRAVENOUS | Status: AC
  Administered 2015-03-02: 125 mg via INTRAVENOUS
  Filled 2015-03-02: qty 25

## 2015-03-02 MED ORDER — SODIUM CHLORIDE 0.9 % IV SOLN
Freq: Once | INTRAVENOUS | Status: AC
  Administered 2015-03-02: 10:00:00 via INTRAVENOUS

## 2015-03-02 MED ORDER — SODIUM CHLORIDE 0.9 % IJ SOLN
10.0000 mL | INTRAMUSCULAR | Status: DC | PRN
  Administered 2015-03-02: 10 mL
  Filled 2015-03-02: qty 10

## 2015-03-02 MED ORDER — ANASTROZOLE 1 MG PO TABS: 1.0000 mg | ORAL_TABLET | Freq: Every day | ORAL | Status: DC

## 2015-03-02 MED ORDER — PALONOSETRON HCL INJECTION 0.25 MG/5ML
INTRAVENOUS | Status: AC
  Filled 2015-03-02: qty 5

## 2015-03-02 MED ORDER — PALONOSETRON HCL INJECTION 0.25 MG/5ML
0.2500 mg | Freq: Once | INTRAVENOUS | Status: AC
  Administered 2015-03-02: 0.25 mg via INTRAVENOUS

## 2015-03-02 NOTE — Telephone Encounter (Signed)
Gave avs & calendar for Septmber. Will call with bone density unable to get through to the [place]

## 2015-03-02 NOTE — Progress Notes (Signed)
Patient Care Team: Leandrew Koyanagi, MD as PCP - General (Family Medicine) Fanny Skates, MD as Consulting Physician (General Surgery) Nicholas Lose, MD as Consulting Physician (Hematology and Oncology) Arloa Koh, MD as Consulting Physician (Radiation Oncology) Mauro Kaufmann, RN as Registered Nurse Rockwell Germany, RN as Registered Nurse Holley Bouche, NP as Nurse Practitioner (Nurse Practitioner)  DIAGNOSIS: Breast cancer of upper-outer quadrant of left female breast   Staging form: Breast, AJCC 7th Edition     Clinical stage from 09/14/2014: Stage IIB (T3, N0, M0) - Unsigned     Pathologic stage from 09/26/2014: Stage IIB (T3, N0, cM0) - Signed by Seward Grater, MD on 10/04/2014       Staging comments: Staged on final mastectomy by Dr. Avis Epley.    SUMMARY OF ONCOLOGIC HISTORY:   Breast cancer of upper-outer quadrant of left female breast   09/06/2014 Initial Diagnosis Left breast 3:00 biopsy: Invasive ductal carcinoma with abundant extracellular mucin, ER 97%, PR 98%, Ki-67 47%, HER-2 negative ratio 1.28   09/12/2014 Breast MRI Left breast: 6.5 x 5.3 x 5.1 cm irregular enhancing mass with 2 adjacent 8 mm satellite masses, no lymphadenopathy   09/23/2014 Surgery Left Mastectomy: IDC grade 2 with extracellular mucin 6.2 cm, 4 LN Neg, ER 97%, PR 98%, Her 2 Neg ratio 1.28, Ki 67 47% T3N0 Stage 2B   10/20/2014 -  Chemotherapy Adjuvant chemotherapy with dose since Adriamycin and Cytoxan 4 followed by Abraxane weekly 12    CHIEF COMPLIANT: cycle 12 Abraxane  INTERVAL HISTORY: Amy [name] is a 64 year old with above-mentioned history of left breast cancer treated with mastectomy and is now on adjuvant chemotherapy. Today is a last cycle of chemotherapy with Abraxane. She has done extremely well from chemotherapy standpoint and is going to finish her treatment today. Denies any nausea or vomiting denies any neuropathy. She has seen some hair follicles start coming on her scalp. She  is slightly more tired than usual.  REVIEW OF SYSTEMS:   Constitutional: Denies fevers, chills or abnormal weight loss, complains of fatigue Eyes: Denies blurriness of vision Ears, nose, mouth, throat, and face: Denies mucositis or sore throat Respiratory: Denies cough, dyspnea or wheezes Cardiovascular: Denies palpitation, chest discomfort or lower extremity swelling Gastrointestinal:  Denies nausea, heartburn or change in bowel habits Skin: Denies abnormal skin rashes Lymphatics: Denies new lymphadenopathy or easy bruising Neurological:Denies numbness, tingling or new weaknesses Behavioral/Psych: Mood is stable, no new changes  Breast:  denies any pain or lumps or nodules in either breasts All other systems were reviewed with the patient and are negative.  I have reviewed the past medical history, past surgical history, social history and family history with the patient and they are unchanged from previous note.  ALLERGIES:  is allergic to codeine; tramadol; darvocet; gold-containing drug products; and sulfa antibiotics.  MEDICATIONS:  Current Outpatient Prescriptions  Medication Sig Dispense Refill  . ALPRAZolam (XANAX) 0.25 MG tablet Take 1 tablet (0.25 mg total) by mouth 2 (two) times daily as needed. 60 tablet 0  . Alum & Mag Hydroxide-Simeth (MAGIC MOUTHWASH W/LIDOCAINE) SOLN 5-82m four times a day as needed for mouth sores. Swish, gargle, and spit. 240 mL 2  . cetirizine (ZYRTEC) 10 MG tablet Take 10 mg by mouth daily.    . citalopram (CELEXA) 40 MG tablet TAKE 1 TABLET (40 MG TOTAL) BY MOUTH DAILY. 90 tablet 3  . dexamethasone (DECADRON) 4 MG tablet   1  . HYDROcodone-acetaminophen (NORCO) 5-325 MG per tablet  Take 1 tablet by mouth every 4 (four) hours as needed. For pain    . lidocaine-prilocaine (EMLA) cream   2  . lisinopril (PRINIVIL,ZESTRIL) 5 MG tablet Take 1 tablet (5 mg total) by mouth daily. 90 tablet 3  . LORazepam (ATIVAN) 0.5 MG tablet Take 1 tablet (0.5 mg total)  by mouth every 6 (six) hours as needed (Nausea or vomiting). 30 tablet 5  . metoprolol succinate (TOPROL-XL) 100 MG 24 hr tablet Take 1 tablet (100 mg total) by mouth daily. Take with or immediately following a meal. 90 tablet 3  . nystatin (MYCOSTATIN) 100000 UNIT/ML suspension   1  . phenylephrine (SUDAFED PE) 10 MG TABS tablet Take 10 mg by mouth daily as needed (allergies).    . prochlorperazine (COMPAZINE) 10 MG tablet Take 1 tablet (10 mg total) by mouth every 6 (six) hours as needed for nausea or vomiting. 30 tablet 1  . promethazine (PHENERGAN) 25 MG tablet TAKE 1 TABLET (25 MG TOTAL) BY MOUTH EVERY 6 (SIX) HOURS AS NEEDED FOR NAUSEA OR VOMITING. 30 tablet 1  . SUMAtriptan (IMITREX) 100 MG tablet Take 100 mg by mouth every 2 (two) hours as needed for migraine. May repeat in 2 hours if headache persists or recurs.    Marland Kitchen UNABLE TO FIND 1 each by Other route once. 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 verapamil (VERELAN PM) 180 MG 24 hr capsule TAKE 1 CAPSULE (180 MG TOTAL) BY MOUTH 2 (TWO) TIMES DAILY. 180 capsule 3  . zolpidem (AMBIEN) 10 MG tablet TAKE 1 TAB AT BEDTIME 30 tablet 0   No current facility-administered medications for this visit.    PHYSICAL EXAMINATION: ECOG PERFORMANCE STATUS: 1 - Symptomatic but completely ambulatory  Filed Vitals:   03/02/15 0902  BP: 142/73  Pulse: 96  Temp: 98.2 F (36.8 C)  Resp: 18   Filed Weights   03/02/15 0902  Weight: 160 lb 3.2 oz (72.666 kg)    GENERAL:alert, no distress and comfortable SKIN: skin color, texture, turgor are normal, no rashes or significant lesions EYES: normal, Conjunctiva are pink and non-injected, sclera clear OROPHARYNX:no exudate, no erythema and lips, buccal mucosa, and tongue normal  NECK: supple, thyroid normal size, non-tender, without nodularity LYMPH:  no palpable lymphadenopathy in the cervical, axillary or inguinal LUNGS:  clear to auscultation and percussion with normal breathing effort HEART: regular rate & rhythm and no murmurs and no lower extremity edema ABDOMEN:abdomen soft, non-tender and normal bowel sounds Musculoskeletal:no cyanosis of digits and no clubbing  NEURO: alert & oriented x 3 with fluent speech, no focal motor/sensory deficits   LABORATORY DATA:  I have reviewed the data as listed   Chemistry      Component Value Date/Time   NA 139 03/02/2015 0852   NA 137 09/20/2014 1553   K 4.1 03/02/2015 0852   K 3.7 09/20/2014 1553   CL 104 09/20/2014 1553   CO2 27 03/02/2015 0852   CO2 21 09/20/2014 1553   BUN 4.6* 03/02/2015 0852   BUN 8 09/20/2014 1553   CREATININE 0.7 03/02/2015 0852   CREATININE 0.68 09/23/2014 1920   CREATININE 0.56 08/16/2014 1208      Component Value Date/Time   CALCIUM 8.9 03/02/2015 0852   CALCIUM 9.1 09/20/2014 1553   ALKPHOS 49 03/02/2015 0852   ALKPHOS 34* 09/20/2014 1553   AST 17 03/02/2015 0852   AST 18 09/20/2014 1553   ALT  14 03/02/2015 0852   ALT 11 09/20/2014 1553   BILITOT 0.30 03/02/2015 0852   BILITOT 0.2* 09/20/2014 1553       Lab Results  Component Value Date   WBC 4.4 03/02/2015   HGB 11.3* 03/02/2015   HCT 34.9 03/02/2015   MCV 89.5 03/02/2015   PLT 435* 03/02/2015   NEUTROABS 1.7 03/02/2015    ASSESSMENT & PLAN:  Breast cancer of upper-outer quadrant of left female breast Left Mastectomy 09/23/14: IDC grade 2 with extracellular mucin 6.2 cm, 4 LN Neg, ER 97%, PR 98%, Her 2 Neg ratio 1.28, Ki 67 47% T3N0 Stage 2B  Treatment plan: adjuvant chemotherapy withDose dense Adriamycin and Cytoxan 4 followed by Abraxane weekly 12 followed by radiation and antiestrogen therapy, Adriamycin and Cytoxan started 10/20/2014 completed 12/01/2014  Current treatment: Today cycle 12/12 Abraxane. Toxicities of chemotherapy: 1. Severe fatigue 2. Chemotherapy-induced neutropenia grade 4: Decreased the dose of cycle 2 of chemotherapy 3. Nausea  which improved with Compazine. Zofran caused migraines. We gave her Phenergan in addition to Compazine for when necessary nausea treatment. 4. Intermittent diarrhea: Improved with Imodium. 5. Leukocytosis as a result of Neulasta injection during Adriamycin and Cytoxan. 6. Leukopenia primarily neutropenia with Abraxane remained stable and she completed chemotherapy Patient does not have neuropathy  Plan: 1. Patient is refusing adjuvant radiation therapy. I will pass this on to Dr. Valere Dross. 2. Start adjuvant Anti-estrogen therapy in one month Patient plans to have breast reconstruction and will meet with Dr. Harlow Mares.  Return to clinic in 2 months for follow-up on antiestrogen therapy No orders of the defined types were placed in this encounter.   The patient has a good understanding of the overall plan. she agrees with it. she will call with any problems that may develop before the next visit here.   Rulon Eisenmenger, MD

## 2015-03-02 NOTE — Patient Instructions (Signed)
Amy [name] Discharge Instructions for Patients Receiving Chemotherapy  Today you received the following chemotherapy agents abraxane  To help prevent nausea and vomiting after your treatment, we encourage you to take your nausea medication if needed   If you develop nausea and vomiting that is not controlled by your nausea medication, call the clinic.   BELOW ARE SYMPTOMS THAT SHOULD BE REPORTED IMMEDIATELY:  *FEVER GREATER THAN 100.5 F  *CHILLS WITH OR WITHOUT FEVER  NAUSEA AND VOMITING THAT IS NOT CONTROLLED WITH YOUR NAUSEA MEDICATION  *UNUSUAL SHORTNESS OF BREATH  *UNUSUAL BRUISING OR BLEEDING  TENDERNESS IN MOUTH AND THROAT WITH OR WITHOUT PRESENCE OF ULCERS  *URINARY PROBLEMS  *BOWEL PROBLEMS  UNUSUAL RASH Items with * indicate a potential emergency and should be followed up as soon as possible.  Feel free to call the clinic you have any questions or concerns. The clinic phone number is [phone].  Please show the [facility] at check-in to the Emergency Department and triage nurse.

## 2015-03-06 ENCOUNTER — Other Ambulatory Visit: Payer: Self-pay

## 2015-03-06 ENCOUNTER — Telehealth: Payer: Self-pay | Admitting: Hematology and Oncology

## 2015-03-06 ENCOUNTER — Other Ambulatory Visit: Payer: Self-pay | Admitting: General Surgery

## 2015-03-06 DIAGNOSIS — E2839 Other primary ovarian failure: Secondary | ICD-10-CM

## 2015-03-06 DIAGNOSIS — Z780 Asymptomatic menopausal state: Secondary | ICD-10-CM

## 2015-03-06 NOTE — Telephone Encounter (Signed)
Left message to confirm appointment for Bone density on 08/24

## 2015-03-14 NOTE — H&P (Signed)
  Amy [name]  Location: [facility] Patient #: [id number] DOB: December 05, 1950 Married / Language: English / Race: White Female      History of Present Illness The patient is a 64 year old female who presents with breast cancer. She returns for another postop visit. On September 23, 2014 she underwent Port-A-Cath insertion, left total mastectomy and sentinel node biopsy. She had a 6.2 cm invasive carcinoma, negative margins, negative sentinel lymph nodes. She has started chemotherapy with Dr. Lindi Adie and is tolerating that well. She thinks that she will complete chemotherapy in latter July. She is still skeptical of radiation therapy but states that she will probably have a consultative visit with Dr. Valere Dross. I encouraged that. She is considering reconstruction. [name] as her plastic surgeon and he she will see him down the road. She's doing fairly well. She has a little bit of soreness of her left upper posterior arm. She has full range of motion of her left shoulder. Really not having any pain. No problems with the Port-A-Cath   Allergies Elbert Ewings, CMA; 01/17/2015 4:36 PM) Codeine Phosphate *ANALGESICS - OPIOID* Darvocet A500 *ANALGESICS - OPIOID* Sulfa Antibiotics TraMADol HCl *ANALGESICS - OPIOID*  Medication History  Norco (5-325MG  Tablet, 1 (one) Tablet Oral every six hours, as needed, Taken starting 10/11/2014) Active. Percocet (5-325MG  Tablet, 1 (one) Tablet Oral every six hours, as needed, Taken starting 09/28/2014) Active. ALPRAZolam (0.25MG  Tablet, Oral) Active. Zolpidem Tartrate (10MG  Tablet, Oral at bedtime) Active. Citalopram Hydrobromide (40MG  Tablet, Oral) Active. Lisinopril (5MG  Tablet, Oral) Active. Metoprolol Succinate ER (100MG  Tablet ER 24HR, Oral) Active. Ondansetron HCl (4MG  Tablet, Oral as needed) Active. Verapamil HCl ER (180MG  Capsule ER 24HR, Oral) Active. Verapamil HCl ER (180MG  Tablet ER, Oral) Active. EMLA  (2.5-2.5% Cream, External) Active. Nystatin (100000 UNIT/ML Suspension, Mouth/Throat) Active. Medications Reconciled  Vitals   Weight: 161 lb Height: 66in Body Surface Area: 1.84 m Body Mass Index: 25.99 kg/m Temp.: 98.39F(Oral)  Pulse: 71 (Regular)  Resp.: 17 (Unlabored)  BP: 128/78 (Sitting, Left Arm, Standard)    Physical Exam  General Note: Very pleasant. Wearing a hat. No distress. Husband is with her. Very cooperative.   Head and Neck Note: No adenopathy or mass   Breast Note: Left mastectomy incision well-healed. Tissues are soft without nodules or ulcerations. No fluid collection. Axilla feels normal. Range of motion left shoulder is 100%. No detectable arm swelling. Mild sensory deficit upper inner arm.     Assessment & Plan MALIGNANT NEOPLASM OF CENTRAL PORTION OF LEFT FEMALE BREAST (174.1  C50.112)   Follow up in 10 months after Feb., 2017 mammograms Your left mastectomy has healed very nicely. There is no sign of infection fluid or cancer. The sore places are probably due to nerve damage from the surgery and will slowly improve Continue to receive chemotherapy until it is all done. I'll advise you to seek consultation with Dr. Valere Dross after the chemotherapy is done, just to hear the pros and cons of everything Contact Dr. Dalbert Batman when it is time to remove the Port-A-Cath and we will schedule that under sedation Return to see Dr. Dalbert Batman in February, 2017 after you get your annual mammogram of the right breast HISTORY OF CRANIOTOMY (V45.89  Z98.89) HYPERTENSION, BENIGN (401.1  I10) CHRONIC GERD (530.81  K21.9) FORMER SMOKER (V15.82  Z87.891)   Amy [name]. Dalbert Batman, M.D., [facility] General and Minimally invasive Surgery Breast and Colorectal Surgery Office:   [phone] Pager:   [phone]

## 2015-03-15 ENCOUNTER — Encounter (HOSPITAL_COMMUNITY)
Admission: RE | Admit: 2015-03-15 | Discharge: 2015-03-15 | Disposition: A | Discharge status: Home / Self Care | Payer: BLUE CROSS/BLUE SHIELD | Source: Ambulatory Visit | Attending: General Surgery | Admitting: General Surgery

## 2015-03-15 ENCOUNTER — Encounter (HOSPITAL_COMMUNITY): Payer: Self-pay

## 2015-03-15 DIAGNOSIS — K219 Gastro-esophageal reflux disease without esophagitis: Secondary | ICD-10-CM | POA: Diagnosis not present

## 2015-03-15 DIAGNOSIS — Z79899 Other long term (current) drug therapy: Secondary | ICD-10-CM | POA: Diagnosis not present

## 2015-03-15 DIAGNOSIS — R51 Headache: Secondary | ICD-10-CM | POA: Diagnosis not present

## 2015-03-15 DIAGNOSIS — K449 Diaphragmatic hernia without obstruction or gangrene: Secondary | ICD-10-CM | POA: Diagnosis not present

## 2015-03-15 DIAGNOSIS — I10 Essential (primary) hypertension: Secondary | ICD-10-CM | POA: Diagnosis not present

## 2015-03-15 DIAGNOSIS — M797 Fibromyalgia: Secondary | ICD-10-CM | POA: Diagnosis not present

## 2015-03-15 DIAGNOSIS — Z79891 Long term (current) use of opiate analgesic: Secondary | ICD-10-CM | POA: Diagnosis not present

## 2015-03-15 DIAGNOSIS — Z853 Personal history of malignant neoplasm of breast: Secondary | ICD-10-CM | POA: Diagnosis not present

## 2015-03-15 DIAGNOSIS — Z452 Encounter for adjustment and management of vascular access device: Secondary | ICD-10-CM | POA: Diagnosis not present

## 2015-03-15 DIAGNOSIS — F419 Anxiety disorder, unspecified: Secondary | ICD-10-CM | POA: Diagnosis not present

## 2015-03-15 DIAGNOSIS — Z9221 Personal history of antineoplastic chemotherapy: Secondary | ICD-10-CM | POA: Diagnosis not present

## 2015-03-15 DIAGNOSIS — Z87891 Personal history of nicotine dependence: Secondary | ICD-10-CM | POA: Diagnosis not present

## 2015-03-15 DIAGNOSIS — Z9012 Acquired absence of left breast and nipple: Secondary | ICD-10-CM | POA: Diagnosis not present

## 2015-03-15 HISTORY — DX: Adverse effect of antineoplastic and immunosuppressive drugs, initial encounter: T45.1X5A

## 2015-03-15 HISTORY — DX: Personal history of antineoplastic chemotherapy: Z92.21

## 2015-03-15 HISTORY — DX: Other specified nonscarring hair loss: L65.8

## 2015-03-15 LAB — CBC
HCT: 38 % (ref 36.0–46.0)
Hemoglobin: 12.4 g/dL (ref 12.0–15.0)
MCH: 28.8 pg (ref 26.0–34.0)
MCHC: 32.6 g/dL (ref 30.0–36.0)
MCV: 88.4 fL (ref 78.0–100.0)
Platelets: 455 10*3/uL — ABNORMAL HIGH (ref 150–400)
RBC: 4.3 MIL/uL (ref 3.87–5.11)
RDW: 13.6 % (ref 11.5–15.5)
WBC: 5.6 10*3/uL (ref 4.0–10.5)

## 2015-03-15 LAB — BASIC METABOLIC PANEL
ANION GAP: 11 (ref 5–15)
BUN: 6 mg/dL (ref 6–20)
CO2: 24 mmol/L (ref 22–32)
CREATININE: 0.65 mg/dL (ref 0.44–1.00)
Calcium: 9.4 mg/dL (ref 8.9–10.3)
Chloride: 101 mmol/L (ref 101–111)
GFR calc Af Amer: >60 mL/min (ref >60)
GFR calc non Af Amer: >60 mL/min (ref >60)
Glucose, Bld: 114 mg/dL — ABNORMAL HIGH (ref 65–99)
Potassium: 4.1 mmol/L (ref 3.5–5.1)
Sodium: 136 mmol/L (ref 135–145)

## 2015-03-15 NOTE — Pre-Procedure Instructions (Signed)
    Amy [name]  03/15/2015      [id number] - [place], [place] [address] [place] [address] Phone: [phone] Fax: [phone]  [id number] - [place], [address] [place] [address] Phone: [phone] Fax: [phone]  [id number] - [place], [place] - [address] [place] [address] Phone: [phone] Fax: [phone]    Your procedure is scheduled on 03/16/2015.  Report to [facility] Admitting at [phone] A.M.  Call this number if you have problems in the days leading up to your surgery:  [phone]  Call this number if you have problems the morning of surgery:  [phone]   Remember:  Do not eat food or drink liquids after midnight tonight, Wednesday August 3rd.  Take these medicines the morning of surgery with A SIP OF WATER: Alprazolam (Xanax), Arimidex (anastrozole), Ceterizine (Zyrtec), Citalopram (Celexa), Hydrocodone-Acetaminophen (Norco), Lorazepam (Ativan), METOPROLOL SUCCINATE (TOPROL-XL).  STOP: ALL Vitamins, Supplements, Effient and Herbal Medications, Fish Oils, Aspirins, NSAIDs (Nonsteroidal Anti-inflammatories such as Ibuprofen, Aleve, or Advil), and Goody's/BC Powders 7 days prior to surgery, until after surgery as directed by your physician.  Stop Sudafed.     Do not wear jewelry, make-up or nail polish.  Do not wear lotions, powders, or perfumes.  You may wear deodorant.  Do not shave 48 hours prior to surgery.    Do not bring valuables to the hospital.  [facility] is not responsible for any belongings or valuables.  Contacts, dentures or bridgework may not be worn into surgery.  Leave your suitcase in the car.  After surgery it may be brought to your room.  For patients admitted to the hospital, discharge time will be determined by your treatment team.  Patients  discharged the day of surgery will not be allowed to drive home.   Special instructions: Please follow these instructions carefully:  1. Shower with CHG Soap the night before surgery and the morning of Surgery. 2. If you choose to wash your hair, wash your hair first as usual with your normal shampoo. 3. After you shampoo, rinse your hair and body thoroughly to remove the Shampoo. 4. Use CHG as you would any other liquid soap. You can apply chg directly to the skin and wash gently with scrungie or a clean washcloth. 5. Apply the CHG Soap to your body ONLY FROM THE NECK DOWN. Do not use on open wounds or open sores. Avoid contact with your eyes, ears, mouth and genitals (private parts). Wash genitals (private parts) with your normal soap. 6. Wash thoroughly, paying special attention to the area where your surgery will be performed. 7. Thoroughly rinse your body with warm water from the neck down. 8. DO NOT shower/wash with your normal soap after using and rinsing off the CHG Soap. 9. Pat yourself dry with a clean towel.  10. Wear clean pajamas.  11. Place clean sheets on your bed the night of your first shower and do not sleep with pets.  Day of Surgery  Do not apply any lotions/deodorants the morning of surgery. Please wear clean clothes to the hospital/surgery center.    Please read over the following fact sheets that you were given. Pain Booklet, Coughing and Deep Breathing and Surgical Site Infection Prevention

## 2015-03-16 ENCOUNTER — Ambulatory Visit (HOSPITAL_COMMUNITY)
Admission: RE | Admit: 2015-03-16 | Discharge: 2015-03-16 | Disposition: A | Discharge status: Home / Self Care | Payer: BLUE CROSS/BLUE SHIELD | Source: Ambulatory Visit | Attending: General Surgery | Admitting: General Surgery

## 2015-03-16 ENCOUNTER — Encounter (HOSPITAL_COMMUNITY): Payer: Self-pay | Admitting: *Deleted

## 2015-03-16 ENCOUNTER — Encounter (HOSPITAL_COMMUNITY)
Admission: RE | Disposition: A | Discharge status: Home / Self Care | Payer: Self-pay | Source: Ambulatory Visit | Attending: General Surgery

## 2015-03-16 ENCOUNTER — Ambulatory Visit (HOSPITAL_COMMUNITY): Payer: BLUE CROSS/BLUE SHIELD | Admitting: Anesthesiology

## 2015-03-16 DIAGNOSIS — F419 Anxiety disorder, unspecified: Secondary | ICD-10-CM | POA: Insufficient documentation

## 2015-03-16 DIAGNOSIS — Z853 Personal history of malignant neoplasm of breast: Secondary | ICD-10-CM | POA: Insufficient documentation

## 2015-03-16 DIAGNOSIS — C50412 Malignant neoplasm of upper-outer quadrant of left female breast: Secondary | ICD-10-CM | POA: Diagnosis present

## 2015-03-16 DIAGNOSIS — Z452 Encounter for adjustment and management of vascular access device: Secondary | ICD-10-CM | POA: Insufficient documentation

## 2015-03-16 DIAGNOSIS — C50912 Malignant neoplasm of unspecified site of left female breast: Secondary | ICD-10-CM | POA: Diagnosis not present

## 2015-03-16 DIAGNOSIS — Z9221 Personal history of antineoplastic chemotherapy: Secondary | ICD-10-CM | POA: Insufficient documentation

## 2015-03-16 DIAGNOSIS — Z79899 Other long term (current) drug therapy: Secondary | ICD-10-CM | POA: Insufficient documentation

## 2015-03-16 DIAGNOSIS — Z87891 Personal history of nicotine dependence: Secondary | ICD-10-CM | POA: Insufficient documentation

## 2015-03-16 DIAGNOSIS — R51 Headache: Secondary | ICD-10-CM | POA: Insufficient documentation

## 2015-03-16 DIAGNOSIS — K449 Diaphragmatic hernia without obstruction or gangrene: Secondary | ICD-10-CM | POA: Insufficient documentation

## 2015-03-16 DIAGNOSIS — Z9012 Acquired absence of left breast and nipple: Secondary | ICD-10-CM | POA: Insufficient documentation

## 2015-03-16 DIAGNOSIS — K219 Gastro-esophageal reflux disease without esophagitis: Secondary | ICD-10-CM | POA: Insufficient documentation

## 2015-03-16 DIAGNOSIS — Z79891 Long term (current) use of opiate analgesic: Secondary | ICD-10-CM | POA: Insufficient documentation

## 2015-03-16 DIAGNOSIS — M545 Low back pain: Secondary | ICD-10-CM | POA: Diagnosis not present

## 2015-03-16 DIAGNOSIS — M797 Fibromyalgia: Secondary | ICD-10-CM | POA: Insufficient documentation

## 2015-03-16 DIAGNOSIS — I10 Essential (primary) hypertension: Secondary | ICD-10-CM | POA: Insufficient documentation

## 2015-03-16 HISTORY — PX: PORT-A-CATH REMOVAL: SHX5289

## 2015-03-16 SURGERY — REMOVAL PORT-A-CATH
Anesthesia: Monitor Anesthesia Care | Site: Chest | Laterality: Right

## 2015-03-16 MED ORDER — ONDANSETRON HCL 4 MG/2ML IJ SOLN: 4.0000 mg | Freq: Once | INTRAMUSCULAR | Status: DC | PRN

## 2015-03-16 MED ORDER — SODIUM CHLORIDE 0.9 % IJ SOLN: 3.0000 mL | Freq: Two times a day (BID) | INTRAMUSCULAR | Status: DC

## 2015-03-16 MED ORDER — PROPOFOL INFUSION 10 MG/ML OPTIME
INTRAVENOUS | Status: DC | PRN
  Administered 2015-03-16: 25 ug/kg/min via INTRAVENOUS

## 2015-03-16 MED ORDER — FENTANYL CITRATE (PF) 250 MCG/5ML IJ SOLN
INTRAMUSCULAR | Status: AC
  Filled 2015-03-16: qty 5

## 2015-03-16 MED ORDER — PROPOFOL 10 MG/ML IV BOLUS
INTRAVENOUS | Status: AC
  Filled 2015-03-16: qty 20

## 2015-03-16 MED ORDER — MIDAZOLAM HCL 2 MG/2ML IJ SOLN
INTRAMUSCULAR | Status: AC
  Filled 2015-03-16: qty 2

## 2015-03-16 MED ORDER — HYDROCODONE-ACETAMINOPHEN 5-325 MG PO TABS: 1.0000 | ORAL_TABLET | Freq: Four times a day (QID) | ORAL | Status: DC | PRN

## 2015-03-16 MED ORDER — OXYCODONE HCL 5 MG PO TABS: 5.0000 mg | ORAL_TABLET | ORAL | Status: DC | PRN

## 2015-03-16 MED ORDER — SODIUM CHLORIDE 0.9 % IV SOLN: 250.0000 mL | INTRAVENOUS | Status: DC | PRN

## 2015-03-16 MED ORDER — 0.9 % SODIUM CHLORIDE (POUR BTL) OPTIME
TOPICAL | Status: DC | PRN
  Administered 2015-03-16: 1000 mL

## 2015-03-16 MED ORDER — LIDOCAINE-EPINEPHRINE (PF) 1 %-1:200000 IJ SOLN
INTRAMUSCULAR | Status: DC | PRN
  Administered 2015-03-16: 20 mL

## 2015-03-16 MED ORDER — FENTANYL CITRATE (PF) 100 MCG/2ML IJ SOLN: 25.0000 ug | INTRAMUSCULAR | Status: DC | PRN

## 2015-03-16 MED ORDER — SODIUM CHLORIDE 0.9 % IJ SOLN: 3.0000 mL | INTRAMUSCULAR | Status: DC | PRN

## 2015-03-16 MED ORDER — CHLORHEXIDINE GLUCONATE 4 % EX LIQD: 1.0000 "application " | Freq: Once | CUTANEOUS | Status: DC

## 2015-03-16 MED ORDER — SODIUM CHLORIDE 0.9 % IV SOLN: INTRAVENOUS | Status: DC

## 2015-03-16 MED ORDER — LIDOCAINE HCL (CARDIAC) 20 MG/ML IV SOLN
INTRAVENOUS | Status: DC | PRN
  Administered 2015-03-16: 50 mg via INTRAVENOUS

## 2015-03-16 MED ORDER — CEFAZOLIN SODIUM-DEXTROSE 2-3 GM-% IV SOLR
2.0000 g | INTRAVENOUS | Status: AC
  Administered 2015-03-16: 2 g via INTRAVENOUS
  Filled 2015-03-16: qty 50

## 2015-03-16 MED ORDER — LIDOCAINE-EPINEPHRINE (PF) 1 %-1:200000 IJ SOLN
INTRAMUSCULAR | Status: AC
  Filled 2015-03-16: qty 30

## 2015-03-16 MED ORDER — SODIUM BICARBONATE 4 % IV SOLN
INTRAVENOUS | Status: DC | PRN
  Administered 2015-03-16: 5 mL via INTRAVENOUS

## 2015-03-16 MED ORDER — ACETAMINOPHEN 650 MG RE SUPP: 650.0000 mg | RECTAL | Status: DC | PRN

## 2015-03-16 MED ORDER — LACTATED RINGERS IV SOLN
INTRAVENOUS | Status: DC
  Administered 2015-03-16: 11:00:00 via INTRAVENOUS

## 2015-03-16 MED ORDER — MIDAZOLAM HCL 5 MG/5ML IJ SOLN
INTRAMUSCULAR | Status: DC | PRN
  Administered 2015-03-16 (×2): 1 mg via INTRAVENOUS

## 2015-03-16 MED ORDER — ACETAMINOPHEN 325 MG PO TABS: 650.0000 mg | ORAL_TABLET | ORAL | Status: DC | PRN

## 2015-03-16 MED ORDER — FENTANYL CITRATE (PF) 100 MCG/2ML IJ SOLN
INTRAMUSCULAR | Status: DC | PRN
  Administered 2015-03-16 (×2): 25 ug via INTRAVENOUS

## 2015-03-16 MED ORDER — SODIUM BICARBONATE 4 % IV SOLN
INTRAVENOUS | Status: AC
  Filled 2015-03-16: qty 5

## 2015-03-16 SURGICAL SUPPLY — 30 items
ADH SKN CLS APL DERMABOND .7 (GAUZE/BANDAGES/DRESSINGS) ×1
BLADE SURG 15 STRL LF DISP TIS (BLADE) ×1 IMPLANT
BLADE SURG 15 STRL SS (BLADE) ×2
CHLORAPREP W/TINT 10.5 ML (MISCELLANEOUS) ×2 IMPLANT
COVER SURGICAL LIGHT HANDLE (MISCELLANEOUS) ×2 IMPLANT
DERMABOND ADVANCED (GAUZE/BANDAGES/DRESSINGS) ×1
DERMABOND ADVANCED .7 DNX12 (GAUZE/BANDAGES/DRESSINGS) ×1 IMPLANT
DRAPE PED LAPAROTOMY (DRAPES) ×2 IMPLANT
DRAPE UTILITY XL STRL (DRAPES) ×4 IMPLANT
ELECT CAUTERY BLADE 6.4 (BLADE) ×2 IMPLANT
ELECT REM PT RETURN 9FT ADLT (ELECTROSURGICAL) ×2
ELECTRODE REM PT RTRN 9FT ADLT (ELECTROSURGICAL) ×1 IMPLANT
GAUZE SPONGE 4X4 16PLY XRAY LF (GAUZE/BANDAGES/DRESSINGS) ×2 IMPLANT
GLOVE EUDERMIC 7 POWDERFREE (GLOVE) ×2 IMPLANT
GLOVE SURG SS PI 8.0 STRL IVOR (GLOVE) ×2 IMPLANT
GOWN STRL REUS W/ TWL LRG LVL3 (GOWN DISPOSABLE) ×1 IMPLANT
GOWN STRL REUS W/ TWL XL LVL3 (GOWN DISPOSABLE) ×1 IMPLANT
GOWN STRL REUS W/TWL LRG LVL3 (GOWN DISPOSABLE) ×2
GOWN STRL REUS W/TWL XL LVL3 (GOWN DISPOSABLE) ×2
KIT BASIN OR (CUSTOM PROCEDURE TRAY) ×2 IMPLANT
KIT ROOM TURNOVER OR (KITS) ×2 IMPLANT
NEEDLE HYPO 25GX1X1/2 BEV (NEEDLE) ×2 IMPLANT
NS IRRIG 1000ML POUR BTL (IV SOLUTION) ×2 IMPLANT
PACK SURGICAL SETUP 50X90 (CUSTOM PROCEDURE TRAY) ×2 IMPLANT
PAD ARMBOARD 7.5X6 YLW CONV (MISCELLANEOUS) ×2 IMPLANT
PENCIL BUTTON HOLSTER BLD 10FT (ELECTRODE) ×4 IMPLANT
SUT MNCRL AB 4-0 PS2 18 (SUTURE) ×2 IMPLANT
SYR CONTROL 10ML LL (SYRINGE) ×2 IMPLANT
TOWEL OR 17X24 6PK STRL BLUE (TOWEL DISPOSABLE) ×2 IMPLANT
TOWEL OR 17X26 10 PK STRL BLUE (TOWEL DISPOSABLE) ×2 IMPLANT

## 2015-03-16 NOTE — Anesthesia Postprocedure Evaluation (Signed)
  Anesthesia Post-op Note  Patient: Amy [name]  Procedure(s) Performed: Procedure(s) (LRB): REMOVAL PORT-A-CATH (Right)  Patient Location: PACU  Anesthesia Type: MAC  Level of Consciousness: awake and alert   Airway and Oxygen Therapy: Patient Spontanous Breathing  Post-op Pain: mild  Post-op Assessment: Post-op Vital signs reviewed, Patient's Cardiovascular Status Stable, Respiratory Function Stable, Patent Airway and No signs of Nausea or vomiting  Last Vitals:  Filed Vitals:   03/16/15 1320  BP: 135/75  Pulse: 74  Temp:   Resp: 13    Post-op Vital Signs: stable   Complications: No apparent anesthesia complications

## 2015-03-16 NOTE — Transfer of Care (Signed)
Immediate Anesthesia Transfer of Care Note  Patient: Amy [name]  Procedure(s) Performed: Procedure(s): REMOVAL PORT-A-CATH (Right)  Patient Location: Phase 2, PACU  Anesthesia Type:MAC  Level of Consciousness: awake, alert , oriented and patient cooperative  Airway & Oxygen Therapy: Patient Spontanous Breathing and Patient connected to nasal cannula oxygen  Post-op Assessment: Report given to RN, Post -op Vital signs reviewed and stable and Patient moving all extremities  Post vital signs: Reviewed and stable  Last Vitals:  Filed Vitals:   03/16/15 1300  BP: 119/83  Pulse: 77  Temp: 36.3 C  Resp: 14    Complications: No apparent anesthesia complications

## 2015-03-16 NOTE — Anesthesia Preprocedure Evaluation (Addendum)
Anesthesia Evaluation  Patient identified by MRN, date of birth, ID band Patient awake    Reviewed: Allergy & Precautions, NPO status , Patient's Chart, lab work & pertinent test results, reviewed documented beta blocker date and time   History of Anesthesia Complications (+) PONV  Airway Mallampati: II  TM Distance: >3 FB Neck ROM: Full    Dental  (+) Teeth Intact, Dental Advisory Given   Pulmonary former smoker,    Pulmonary exam normal       Cardiovascular hypertension, Pt. on medications and Pt. on home beta blockers Normal cardiovascular exam    Neuro/Psych  Headaches, Anxiety    GI/Hepatic Neg liver ROS, hiatal hernia, GERD-  ,  Endo/Other  negative endocrine ROS  Renal/GU negative Renal ROS     Musculoskeletal  (+) Fibromyalgia -  Abdominal   Peds  Hematology negative hematology ROS (+)   Anesthesia Other Findings   Reproductive/Obstetrics                            Anesthesia Physical  Anesthesia Plan  ASA: II  Anesthesia Plan: MAC   Post-op Pain Management:    Induction: Intravenous  Airway Management Planned: Natural Airway  Additional Equipment:   Intra-op Plan:   Post-operative Plan:   Informed Consent: I have reviewed the patients History and Physical, chart, labs and discussed the procedure including the risks, benefits and alternatives for the proposed anesthesia with the patient or authorized representative who has indicated his/her understanding and acceptance.   Dental advisory given  Plan Discussed with: CRNA, Surgeon and Anesthesiologist  Anesthesia Plan Comments:         Anesthesia Quick Evaluation

## 2015-03-16 NOTE — Op Note (Signed)
  Patient Name:           Amy [name]   Date of Surgery:        03/16/2015  Pre op Diagnosis:      Cancer of left breast  Post op Diagnosis:    Same  Procedure:                 Removal of Port-A-Cath  Surgeon:                     Edsel Petrin. Dalbert Batman, M.D., FACS  Assistant:                      Or staff  Operative Indications:   The patient is a 64 year old female who presents with breast cancer.  On September 23, 2014 she underwent Port-A-Cath insertion, left total mastectomy and sentinel node biopsy. She had a 6.2 cm invasive carcinoma, negative margins, negative sentinel lymph nodes. She has completed chemotherapy with Dr. Lindi Adie.    I encouraged that. She is considering reconstruction. [name] as her plastic surgeon and he she will see him down the road. She's doing fairly well.  She is doing well clinically.  She was referred for removal of Port-A-Cath.  Operative Findings:       The port and catheter was removed intact.  No bleeding.  No signs of infection.  Procedure in Detail:          The patient was brought to the operating room and placed supine on the operating table.  She was monitored and sedated by the anesthesia department.  The right upper chest was prepped and draped in a sterile fashion.  Surgical timeout was performed.  1% Xylocaine with epinephrine was used as a local infiltration anesthesic.  Transverse incision was made through the old scar.  Dissection was carried down to the port.  The capsule was incised.  The port was mobilized and the Prolenes sutures cut and the port and catheter removed.  The subcutaneous tissue was closed with interrupted sutures of 3-0 Vicryl and the skin closed running subcuticular sutures of 4-0 Monocryl and Dermabond.  She tolerated the procedure well and taken to PACU in stable condition.  EBL 5 mL.  Counts correct.  Complications none.     Edsel Petrin. Dalbert Batman, M.D., FACS General and Minimally Invasive Surgery Breast and  Colorectal Surgery  03/16/2015 12:56 PM

## 2015-03-16 NOTE — Discharge Instructions (Signed)
Keep the wound clean and dry The Dermabond glue should wear off in 3 weeks Ice pack, intermittently for 24 hours  You may shower starting tomorrow

## 2015-03-16 NOTE — Interval H&P Note (Signed)
History and Physical Interval Note:  03/16/2015 12:14 PM  Amy [name]  has presented today for surgery, with the diagnosis of breast cancer  The various methods of treatment have been discussed with the patient and family. After consideration of risks, benefits and other options for treatment, the patient has consented to  Procedure(s): REMOVAL PORT-A-CATH (N/A) as a surgical intervention .  The patient's history has been reviewed, patient examined, no change in status, stable for surgery.  I have reviewed the patient's chart and labs.  Questions were answered to the patient's satisfaction.     Adin Hector

## 2015-03-17 ENCOUNTER — Encounter (HOSPITAL_COMMUNITY): Payer: Self-pay | Admitting: General Surgery

## 2015-03-27 ENCOUNTER — Other Ambulatory Visit: Payer: Self-pay | Admitting: Physician Assistant

## 2015-03-28 ENCOUNTER — Other Ambulatory Visit: Payer: Self-pay | Admitting: Physician Assistant

## 2015-03-29 NOTE — Telephone Encounter (Signed)
Faxed

## 2015-03-30 NOTE — Telephone Encounter (Addendum)
Called in RF because pt calling to check on it, pharm doesn't have and don't see documentation that it was faxed or called already. Notified husband.

## 2015-04-05 ENCOUNTER — Inpatient Hospital Stay: Admission: RE | Admit: 2015-04-05 | Payer: BLUE CROSS/BLUE SHIELD | Source: Ambulatory Visit

## 2015-04-06 ENCOUNTER — Other Ambulatory Visit: Payer: Self-pay | Admitting: Hematology and Oncology

## 2015-04-06 DIAGNOSIS — E2839 Other primary ovarian failure: Secondary | ICD-10-CM

## 2015-04-06 DIAGNOSIS — Z780 Asymptomatic menopausal state: Secondary | ICD-10-CM

## 2015-04-12 ENCOUNTER — Ambulatory Visit
Admission: RE | Admit: 2015-04-12 | Discharge: 2015-04-12 | Disposition: A | Discharge status: Home / Self Care | Payer: BLUE CROSS/BLUE SHIELD | Source: Ambulatory Visit | Attending: Hematology and Oncology | Admitting: Hematology and Oncology

## 2015-04-12 DIAGNOSIS — E2839 Other primary ovarian failure: Secondary | ICD-10-CM

## 2015-04-12 DIAGNOSIS — Z780 Asymptomatic menopausal state: Secondary | ICD-10-CM

## 2015-04-18 ENCOUNTER — Other Ambulatory Visit: Payer: Self-pay | Admitting: Physician Assistant

## 2015-04-19 NOTE — Telephone Encounter (Signed)
Rx faxed

## 2015-04-25 ENCOUNTER — Telehealth: Payer: Self-pay

## 2015-04-25 NOTE — Telephone Encounter (Signed)
Bone density results dtd 04/12/15 rcvd from the breast ctr.  Reviewed by Dr. Lindi Adie.  Sent to scan.

## 2015-04-26 ENCOUNTER — Other Ambulatory Visit: Payer: Self-pay | Admitting: Physician Assistant

## 2015-04-27 ENCOUNTER — Ambulatory Visit (HOSPITAL_BASED_OUTPATIENT_CLINIC_OR_DEPARTMENT_OTHER): Payer: BLUE CROSS/BLUE SHIELD | Admitting: Hematology and Oncology

## 2015-04-27 ENCOUNTER — Encounter: Payer: Self-pay | Admitting: Hematology and Oncology

## 2015-04-27 VITALS — BP 139/82 | HR 71 | Temp 98.3°F | Resp 18 | Ht 66.0 in | Wt 157.2 lb

## 2015-04-27 DIAGNOSIS — C50412 Malignant neoplasm of upper-outer quadrant of left female breast: Secondary | ICD-10-CM | POA: Diagnosis not present

## 2015-04-27 NOTE — Assessment & Plan Note (Signed)
Left Mastectomy 09/23/14: IDC grade 2 with extracellular mucin 6.2 cm, 4 LN Neg, ER 97%, PR 98%, Her 2 Neg ratio 1.28, Ki 67 47% T3N0 Stage 2B status post adjuvant chemotherapy with dose dense Adriamycin and Cytoxan followed by weekly Abraxane 12 started 10/20/2014 completed 03/02/2015, refused adjuvant radiation therapy, started anastrozole 1 mg daily 03/27/2015  Anastrozole toxicities:  Breast cancer surveillance: 1. Annual mammogram on right breast 2. Periodic physical exams breasts every 6 months  Breast reconstruction being planned with Dr. Harlow Mares Return to clinic in 3 months for follow-up

## 2015-04-27 NOTE — Progress Notes (Signed)
Patient Care Team: Leandrew Koyanagi, MD as PCP - General (Family Medicine) Fanny Skates, MD as Consulting Physician (General Surgery) Nicholas Lose, MD as Consulting Physician (Hematology and Oncology) Arloa Koh, MD as Consulting Physician (Radiation Oncology) Mauro Kaufmann, RN as Registered Nurse Rockwell Germany, RN as Registered Nurse Holley Bouche, NP as Nurse Practitioner (Nurse Practitioner)  DIAGNOSIS: Breast cancer of upper-outer quadrant of left female breast   Staging form: Breast, AJCC 7th Edition     Clinical stage from 09/14/2014: Stage IIB (T3, N0, M0) - Unsigned     Pathologic stage from 09/26/2014: Stage IIB (T3, N0, cM0) - Signed by Seward Grater, MD on 10/04/2014       Staging comments: Staged on final mastectomy by Dr. Avis Epley.    SUMMARY OF ONCOLOGIC HISTORY:   Breast cancer of upper-outer quadrant of left female breast   09/06/2014 Initial Diagnosis Left breast 3:00 biopsy: Invasive ductal carcinoma with abundant extracellular mucin, ER 97%, PR 98%, Ki-67 47%, HER-2 negative ratio 1.28   09/12/2014 Breast MRI Left breast: 6.5 x 5.3 x 5.1 cm irregular enhancing mass with 2 adjacent 8 mm satellite masses, no lymphadenopathy   09/23/2014 Surgery Left Mastectomy: IDC grade 2 with extracellular mucin 6.2 cm, 4 LN Neg, ER 97%, PR 98%, Her 2 Neg ratio 1.28, Ki 67 47% T3N0 Stage 2B   10/20/2014 - 03/02/2015 Chemotherapy Adjuvant chemotherapy with dose since Adriamycin and Cytoxan 4 followed by Abraxane weekly 12, refused radiation   04/24/2015 -  Anti-estrogen oral therapy Anastrozole 1 mg daily    CHIEF COMPLIANT: Follow-up on anastrozole  INTERVAL HISTORY: Amy [name] is a 64 year old with above-mentioned history of left breast cancer currently started on oral antiestrogen therapy after completion of chemotherapy. She was very anxious about starting anastrozole because she has bad back and had multiple fused vertebrae. She is only been on it for the past 3 days  and appears to be doing quite well. Denies any new symptoms or concerns.  REVIEW OF SYSTEMS:   Constitutional: Denies fevers, chills or abnormal weight loss Eyes: Denies blurriness of vision Ears, nose, mouth, throat, and face: Denies mucositis or sore throat Respiratory: Denies cough, dyspnea or wheezes Cardiovascular: Denies palpitation, chest discomfort or lower extremity swelling Gastrointestinal:  Denies nausea, heartburn or change in bowel habits Skin: Denies abnormal skin rashes Lymphatics: Denies new lymphadenopathy or easy bruising Neurological:Denies numbness, tingling or new weaknesses Behavioral/Psych: Mood is stable, no new changes  Breast:  denies any pain or lumps or nodules in either breasts All other systems were reviewed with the patient and are negative.  I have reviewed the past medical history, past surgical history, social history and family history with the patient and they are unchanged from previous note.  ALLERGIES:  is allergic to codeine; darvocet; tramadol; gold-containing drug products; and sulfa antibiotics.  MEDICATIONS:  Current Outpatient Prescriptions  Medication Sig Dispense Refill  . ALPRAZolam (XANAX) 0.25 MG tablet TAKE 1 TABLET BY MOUTH 2 TIMES A DAY AS NEEDED 60 tablet 0  . cetirizine (ZYRTEC) 10 MG tablet Take 10 mg by mouth daily.    . citalopram (CELEXA) 40 MG tablet TAKE 1 TABLET (40 MG TOTAL) BY MOUTH DAILY. 90 tablet 3  . HYDROcodone-acetaminophen (NORCO) 5-325 MG per tablet Take 1 tablet by mouth every 4 (four) hours as needed. For pain    . lisinopril (PRINIVIL,ZESTRIL) 5 MG tablet Take 1 tablet (5 mg total) by mouth daily. 90 tablet 3  . metoprolol succinate (  TOPROL-XL) 100 MG 24 hr tablet Take 1 tablet (100 mg total) by mouth daily. Take with or immediately following a meal. 90 tablet 3  . phenylephrine (SUDAFED PE) 10 MG TABS tablet Take 10 mg by mouth daily as needed (allergies).    . SUMAtriptan (IMITREX) 100 MG tablet Take 100 mg by  mouth every 2 (two) hours as needed for migraine. May repeat in 2 hours if headache persists or recurs.    . verapamil (VERELAN PM) 180 MG 24 hr capsule TAKE 1 CAPSULE (180 MG TOTAL) BY MOUTH 2 (TWO) TIMES DAILY. 180 capsule 3  . zolpidem (AMBIEN) 10 MG tablet TAKE 1 TABLET BY MOUTH AT BEDTIME 30 tablet 0  . anastrozole (ARIMIDEX) 1 MG tablet Take 1 tablet (1 mg total) by mouth daily. (Patient not taking: Reported on 04/27/2015) 90 tablet 3  . LORazepam (ATIVAN) 0.5 MG tablet Take 1 tablet (0.5 mg total) by mouth every 6 (six) hours as needed (Nausea or vomiting). (Patient not taking: Reported on 04/27/2015) 30 tablet 5  . prochlorperazine (COMPAZINE) 10 MG tablet Take 1 tablet (10 mg total) by mouth every 6 (six) hours as needed for nausea or vomiting. (Patient not taking: Reported on 04/27/2015) 30 tablet 1  . promethazine (PHENERGAN) 25 MG tablet TAKE 1 TABLET (25 MG TOTAL) BY MOUTH EVERY 6 (SIX) HOURS AS NEEDED FOR NAUSEA OR VOMITING. (Patient not taking: Reported on 04/27/2015) 30 tablet 1   No current facility-administered medications for this visit.    PHYSICAL EXAMINATION: ECOG PERFORMANCE STATUS: 1 - Symptomatic but completely ambulatory  Filed Vitals:   04/27/15 0829  BP: 139/82  Pulse: 71  Temp: 98.3 F (36.8 C)  Resp: 18   Filed Weights   04/27/15 0829  Weight: 157 lb 3.2 oz (71.305 kg)    GENERAL:alert, no distress and comfortable SKIN: skin color, texture, turgor are normal, no rashes or significant lesions EYES: normal, Conjunctiva are pink and non-injected, sclera clear OROPHARYNX:no exudate, no erythema and lips, buccal mucosa, and tongue normal  NECK: supple, thyroid normal size, non-tender, without nodularity LYMPH:  no palpable lymphadenopathy in the cervical, axillary or inguinal LUNGS: clear to auscultation and percussion with normal breathing effort HEART: regular rate & rhythm and no murmurs and no lower extremity edema ABDOMEN:abdomen soft, non-tender and  normal bowel sounds Musculoskeletal:no cyanosis of digits and no clubbing  NEURO: alert & oriented x 3 with fluent speech, no focal motor/sensory deficits LABORATORY DATA:  I have reviewed the data as listed   Chemistry      Component Value Date/Time   NA 136 03/15/2015 1043   NA 139 03/02/2015 0852   K 4.1 03/15/2015 1043   K 4.1 03/02/2015 0852   CL 101 03/15/2015 1043   CO2 24 03/15/2015 1043   CO2 27 03/02/2015 0852   BUN 6 03/15/2015 1043   BUN 4.6* 03/02/2015 0852   CREATININE 0.65 03/15/2015 1043   CREATININE 0.7 03/02/2015 0852   CREATININE 0.56 08/16/2014 1208      Component Value Date/Time   CALCIUM 9.4 03/15/2015 1043   CALCIUM 8.9 03/02/2015 0852   ALKPHOS 49 03/02/2015 0852   ALKPHOS 34* 09/20/2014 1553   AST 17 03/02/2015 0852   AST 18 09/20/2014 1553   ALT 14 03/02/2015 0852   ALT 11 09/20/2014 1553   BILITOT 0.30 03/02/2015 0852   BILITOT 0.2* 09/20/2014 1553       Lab Results  Component Value Date   WBC 5.6 03/15/2015   HGB  12.4 03/15/2015   HCT 38.0 03/15/2015   MCV 88.4 03/15/2015   PLT 455* 03/15/2015   NEUTROABS 1.7 03/02/2015   ASSESSMENT & PLAN:  Breast cancer of upper-outer quadrant of left female breast Left Mastectomy 09/23/14: IDC grade 2 with extracellular mucin 6.2 cm, 4 LN Neg, ER 97%, PR 98%, Her 2 Neg ratio 1.28, Ki 67 47% T3N0 Stage 2B status post adjuvant chemotherapy with dose dense Adriamycin and Cytoxan followed by weekly Abraxane 12 started 10/20/2014 completed 03/02/2015, refused adjuvant radiation therapy, started anastrozole 1 mg daily 03/27/2015  Anastrozole toxicities: No major side effects so far. We will continue to watch and monitor her. 1. Bone density test 04/12/2015 T score -0.6 normal  Breast cancer surveillance: 1. Annual mammogram on right breast 2. Periodic physical exams breasts every 6 months  Breast reconstruction being planned with Dr. Harlow Mares Return to clinic in 3 months for follow-up  No orders of the  defined types were placed in this encounter.   The patient has a good understanding of the overall plan. she agrees with it. she will call with any problems that may develop before the next visit here.   Rulon Eisenmenger, MD      LCR

## 2015-05-03 ENCOUNTER — Telehealth: Payer: Self-pay | Admitting: Hematology and Oncology

## 2015-05-03 NOTE — Telephone Encounter (Signed)
Called and left a message with a 3month appointment °

## 2015-05-08 ENCOUNTER — Other Ambulatory Visit (HOSPITAL_COMMUNITY): Payer: Self-pay | Admitting: Plastic Surgery

## 2015-05-08 NOTE — Pre-Procedure Instructions (Signed)
    Amy [name]  05/08/2015      [id number] - [place], [place] [address] [place] [address] Phone: [phone] Fax: [phone]  [id number] - [place], [place] [place] [address] Phone: [phone] Fax: [phone]  [id number] - [place], [place] - [address] [place] [address] Phone: [phone] Fax: [phone]    Your procedure is scheduled on Thursday, October 6th, 2016 .  Report to [facility] Admitting at 11:00 A.M.  Call this number if you have problems the morning of surgery:  [phone]   Remember:  Do not eat food or drink liquids after midnight.   Take these medicines the morning of surgery with A SIP OF WATER: Alprazolam (Xanax), Cetirizine (Zyrtec), Citalopram (Celexa), Hydrocodone-acetaminophen (Norco), Lansoprazole (Prevacid), Metoprolol Succinate (Toprol-XL).    7 days prior to surgery, stop taking the following: NSAIDS, Aspirin, Aleve, Naproxen, Ibuprofen, Advil, Motrin, BC's, Goody's, fish oil, all herbal medications, and all vitamins.    Do not wear jewelry, make-up or nail polish.  Do not wear lotions, powders, or perfumes.  You may NOT wear deodorant.  Do not shave 48 hours prior to surgery.    Do not bring valuables to the hospital.  [facility] is not responsible for any belongings or valuables.  Contacts, dentures or bridgework may not be worn into surgery.  Leave your suitcase in the car.  After surgery it may be brought to your room.  For patients admitted to the hospital, discharge time will be determined by your treatment team.  Patients discharged the day of surgery will not be allowed to drive home.   Special instructions:  See attached.   Please read over the following fact sheets that you were given. Pain Booklet, Coughing and Deep Breathing,  Blood Transfusion Information and Surgical Site Infection Prevention

## 2015-05-09 ENCOUNTER — Encounter (HOSPITAL_COMMUNITY)
Admission: RE | Admit: 2015-05-09 | Discharge: 2015-05-09 | Disposition: A | Discharge status: Home / Self Care | Payer: BLUE CROSS/BLUE SHIELD | Source: Ambulatory Visit | Attending: Plastic Surgery | Admitting: Plastic Surgery

## 2015-05-09 ENCOUNTER — Encounter (HOSPITAL_COMMUNITY): Payer: Self-pay

## 2015-05-09 DIAGNOSIS — C50912 Malignant neoplasm of unspecified site of left female breast: Secondary | ICD-10-CM | POA: Insufficient documentation

## 2015-05-09 DIAGNOSIS — Z01818 Encounter for other preprocedural examination: Secondary | ICD-10-CM | POA: Diagnosis not present

## 2015-05-09 DIAGNOSIS — Z01812 Encounter for preprocedural laboratory examination: Secondary | ICD-10-CM | POA: Diagnosis not present

## 2015-05-09 HISTORY — DX: Stress incontinence (female) (male): N39.3

## 2015-05-09 HISTORY — DX: Unspecified hearing loss, left ear: H91.92

## 2015-05-09 LAB — CBC
HCT: 37.9 % (ref 36.0–46.0)
Hemoglobin: 12.3 g/dL (ref 12.0–15.0)
MCH: 27.4 pg (ref 26.0–34.0)
MCHC: 32.5 g/dL (ref 30.0–36.0)
MCV: 84.4 fL (ref 78.0–100.0)
PLATELETS: 408 10*3/uL — AB (ref 150–400)
RBC: 4.49 MIL/uL (ref 3.87–5.11)
RDW: 13.9 % (ref 11.5–15.5)
WBC: 6.6 10*3/uL (ref 4.0–10.5)

## 2015-05-09 LAB — BASIC METABOLIC PANEL
ANION GAP: 8 (ref 5–15)
BUN: 8 mg/dL (ref 6–20)
CALCIUM: 9.3 mg/dL (ref 8.9–10.3)
CO2: 27 mmol/L (ref 22–32)
CREATININE: 0.76 mg/dL (ref 0.44–1.00)
Chloride: 100 mmol/L — ABNORMAL LOW (ref 101–111)
GLUCOSE: 93 mg/dL (ref 65–99)
Potassium: 4 mmol/L (ref 3.5–5.1)
Sodium: 135 mmol/L (ref 135–145)

## 2015-05-09 NOTE — Progress Notes (Signed)
PCP - Dr. Tami Lin Cardiologist - denies  EKG- 09/2014 - Epic CXR- denies Echo- 09/2014 - Epic Stress/cardiac cath - denies  Patient denies shortness of breath and chest pain at PAT appointment.

## 2015-05-10 NOTE — Progress Notes (Signed)
Anesthesia Chart Review:  Pt is 64 year old female scheduled for L breast reconstruction with placement of tissue expander and acellular hydrated dermis matrix on 05/18/2015 with Dr. Harlow Mares.   Preoperative labs reviewed.    Please see Myra Gianotti, PA's note dated 09/21/14 for detailed information.   If no changes, I anticipate pt can proceed with surgery as scheduled.   Willeen Cass, FNP-BC [facility]/Anesthesiology Phone: [phone] 05/10/2015 2:58 PM

## 2015-05-17 MED ORDER — HEPARIN SODIUM (PORCINE) 5000 UNIT/ML IJ SOLN
5000.0000 [IU] | INTRAMUSCULAR | Status: AC
  Administered 2015-05-18: 5000 [IU] via SUBCUTANEOUS
  Filled 2015-05-17: qty 1

## 2015-05-17 MED ORDER — CEFAZOLIN SODIUM-DEXTROSE 2-3 GM-% IV SOLR
2.0000 g | INTRAVENOUS | Status: AC
  Administered 2015-05-18: 2 g via INTRAVENOUS
  Filled 2015-05-17: qty 50

## 2015-05-17 NOTE — Progress Notes (Signed)
Message left for pat to arrive at 1030 for 1230 surgery

## 2015-05-18 ENCOUNTER — Ambulatory Visit (HOSPITAL_COMMUNITY)
Admission: RE | Admit: 2015-05-18 | Discharge: 2015-05-19 | Disposition: A | Discharge status: Home / Self Care | Payer: BLUE CROSS/BLUE SHIELD | Source: Ambulatory Visit | Attending: Plastic Surgery | Admitting: Plastic Surgery

## 2015-05-18 ENCOUNTER — Encounter (HOSPITAL_COMMUNITY): Payer: Self-pay | Admitting: *Deleted

## 2015-05-18 ENCOUNTER — Inpatient Hospital Stay (HOSPITAL_COMMUNITY): Payer: BLUE CROSS/BLUE SHIELD

## 2015-05-18 ENCOUNTER — Inpatient Hospital Stay (HOSPITAL_COMMUNITY): Payer: BLUE CROSS/BLUE SHIELD | Admitting: Emergency Medicine

## 2015-05-18 ENCOUNTER — Encounter (HOSPITAL_COMMUNITY)
Admission: RE | Disposition: A | Discharge status: Home / Self Care | Payer: Self-pay | Source: Ambulatory Visit | Attending: Plastic Surgery

## 2015-05-18 DIAGNOSIS — Z885 Allergy status to narcotic agent status: Secondary | ICD-10-CM | POA: Diagnosis not present

## 2015-05-18 DIAGNOSIS — K449 Diaphragmatic hernia without obstruction or gangrene: Secondary | ICD-10-CM | POA: Insufficient documentation

## 2015-05-18 DIAGNOSIS — Z87891 Personal history of nicotine dependence: Secondary | ICD-10-CM | POA: Diagnosis not present

## 2015-05-18 DIAGNOSIS — K219 Gastro-esophageal reflux disease without esophagitis: Secondary | ICD-10-CM | POA: Diagnosis not present

## 2015-05-18 DIAGNOSIS — N6489 Other specified disorders of breast: Secondary | ICD-10-CM | POA: Insufficient documentation

## 2015-05-18 DIAGNOSIS — Z9012 Acquired absence of left breast and nipple: Secondary | ICD-10-CM | POA: Insufficient documentation

## 2015-05-18 DIAGNOSIS — Z421 Encounter for breast reconstruction following mastectomy: Principal | ICD-10-CM | POA: Insufficient documentation

## 2015-05-18 DIAGNOSIS — C50912 Malignant neoplasm of unspecified site of left female breast: Secondary | ICD-10-CM | POA: Diagnosis not present

## 2015-05-18 DIAGNOSIS — Z853 Personal history of malignant neoplasm of breast: Secondary | ICD-10-CM | POA: Insufficient documentation

## 2015-05-18 DIAGNOSIS — I10 Essential (primary) hypertension: Secondary | ICD-10-CM | POA: Insufficient documentation

## 2015-05-18 DIAGNOSIS — Z882 Allergy status to sulfonamides status: Secondary | ICD-10-CM | POA: Diagnosis not present

## 2015-05-18 DIAGNOSIS — F419 Anxiety disorder, unspecified: Secondary | ICD-10-CM | POA: Diagnosis not present

## 2015-05-18 DIAGNOSIS — Z888 Allergy status to other drugs, medicaments and biological substances status: Secondary | ICD-10-CM | POA: Diagnosis not present

## 2015-05-18 DIAGNOSIS — Z9221 Personal history of antineoplastic chemotherapy: Secondary | ICD-10-CM | POA: Insufficient documentation

## 2015-05-18 HISTORY — PX: BREAST RECONSTRUCTION WITH PLACEMENT OF TISSUE EXPANDER AND FLEX HD (ACELLULAR HYDRATED DERMIS): SHX6295

## 2015-05-18 HISTORY — PX: BREAST RECONSTRUCTION: SHX9

## 2015-05-18 SURGERY — BREAST RECONSTRUCTION WITH PLACEMENT OF TISSUE EXPANDER AND FLEX HD (ACELLULAR HYDRATED DERMIS)
Anesthesia: General | Site: Breast | Laterality: Left

## 2015-05-18 MED ORDER — ACETAMINOPHEN 325 MG PO TABS: 325.0000 mg | ORAL_TABLET | Freq: Four times a day (QID) | ORAL | Status: DC | PRN

## 2015-05-18 MED ORDER — ONDANSETRON HCL 4 MG/2ML IJ SOLN
INTRAMUSCULAR | Status: AC
  Filled 2015-05-18: qty 2

## 2015-05-18 MED ORDER — FENTANYL CITRATE (PF) 250 MCG/5ML IJ SOLN
INTRAMUSCULAR | Status: DC | PRN
  Administered 2015-05-18 (×2): 50 ug via INTRAVENOUS
  Administered 2015-05-18 (×2): 25 ug via INTRAVENOUS
  Administered 2015-05-18: 100 ug via INTRAVENOUS

## 2015-05-18 MED ORDER — HYDROMORPHONE HCL 2 MG PO TABS
2.0000 mg | ORAL_TABLET | ORAL | Status: DC | PRN
  Administered 2015-05-18: 2 mg via ORAL
  Administered 2015-05-18 – 2015-05-19 (×3): 4 mg via ORAL
  Filled 2015-05-18: qty 2
  Filled 2015-05-18 (×2): qty 1
  Filled 2015-05-18: qty 2
  Filled 2015-05-18: qty 1

## 2015-05-18 MED ORDER — MIDAZOLAM HCL 5 MG/5ML IJ SOLN
INTRAMUSCULAR | Status: DC | PRN
  Administered 2015-05-18: 2 mg via INTRAVENOUS

## 2015-05-18 MED ORDER — LIDOCAINE HCL (CARDIAC) 20 MG/ML IV SOLN
INTRAVENOUS | Status: AC
  Filled 2015-05-18: qty 5

## 2015-05-18 MED ORDER — LACTATED RINGERS IV SOLN
INTRAVENOUS | Status: DC
  Administered 2015-05-18 (×2): via INTRAVENOUS

## 2015-05-18 MED ORDER — PROPOFOL 10 MG/ML IV BOLUS
INTRAVENOUS | Status: AC
  Filled 2015-05-18: qty 20

## 2015-05-18 MED ORDER — ANASTROZOLE 1 MG PO TABS
1.0000 mg | ORAL_TABLET | Freq: Every day | ORAL | Status: DC
  Administered 2015-05-18 – 2015-05-19 (×2): 1 mg via ORAL
  Filled 2015-05-18 (×2): qty 1

## 2015-05-18 MED ORDER — DOCUSATE SODIUM 100 MG PO CAPS
100.0000 mg | ORAL_CAPSULE | Freq: Every day | ORAL | Status: DC
  Administered 2015-05-18 – 2015-05-19 (×2): 100 mg via ORAL
  Filled 2015-05-18 (×2): qty 1

## 2015-05-18 MED ORDER — SCOPOLAMINE 1 MG/3DAYS TD PT72
1.0000 | MEDICATED_PATCH | TRANSDERMAL | Status: DC
  Administered 2015-05-18: 1.5 mg via TRANSDERMAL
  Filled 2015-05-18: qty 1

## 2015-05-18 MED ORDER — CEFAZOLIN SODIUM 1-5 GM-% IV SOLN
1.0000 g | Freq: Three times a day (TID) | INTRAVENOUS | Status: DC
  Administered 2015-05-18 – 2015-05-19 (×2): 1 g via INTRAVENOUS
  Filled 2015-05-18 (×4): qty 50

## 2015-05-18 MED ORDER — LIDOCAINE HCL (CARDIAC) 20 MG/ML IV SOLN
INTRAVENOUS | Status: DC | PRN
  Administered 2015-05-18: 60 mg via INTRAVENOUS

## 2015-05-18 MED ORDER — PROMETHAZINE HCL 25 MG/ML IJ SOLN
INTRAMUSCULAR | Status: AC
  Filled 2015-05-18: qty 1

## 2015-05-18 MED ORDER — ROCURONIUM BROMIDE 100 MG/10ML IV SOLN
INTRAVENOUS | Status: DC | PRN
  Administered 2015-05-18: 50 mg via INTRAVENOUS

## 2015-05-18 MED ORDER — GLYCOPYRROLATE 0.2 MG/ML IJ SOLN
INTRAMUSCULAR | Status: DC | PRN
  Administered 2015-05-18: 0.4 mg via INTRAVENOUS

## 2015-05-18 MED ORDER — PROMETHAZINE HCL 25 MG/ML IJ SOLN
6.2500 mg | INTRAMUSCULAR | Status: DC | PRN
  Administered 2015-05-18: 6.25 mg via INTRAVENOUS

## 2015-05-18 MED ORDER — POVIDONE-IODINE 10 % EX SOLN
CUTANEOUS | Status: DC | PRN
  Administered 2015-05-18: 1 via TOPICAL

## 2015-05-18 MED ORDER — METHOCARBAMOL 500 MG PO TABS
500.0000 mg | ORAL_TABLET | Freq: Four times a day (QID) | ORAL | Status: DC | PRN
  Administered 2015-05-18 – 2015-05-19 (×2): 500 mg via ORAL
  Filled 2015-05-18 (×2): qty 1

## 2015-05-18 MED ORDER — ROCURONIUM BROMIDE 50 MG/5ML IV SOLN
INTRAVENOUS | Status: AC
  Filled 2015-05-18: qty 1

## 2015-05-18 MED ORDER — ZOLPIDEM TARTRATE 5 MG PO TABS
5.0000 mg | ORAL_TABLET | Freq: Every day | ORAL | Status: DC
  Administered 2015-05-19: 5 mg via ORAL
  Filled 2015-05-18: qty 1

## 2015-05-18 MED ORDER — METOPROLOL SUCCINATE ER 100 MG PO TB24
100.0000 mg | ORAL_TABLET | Freq: Every day | ORAL | Status: DC
  Administered 2015-05-19: 100 mg via ORAL
  Filled 2015-05-18: qty 1

## 2015-05-18 MED ORDER — DIPHENHYDRAMINE HCL 50 MG/ML IJ SOLN
INTRAMUSCULAR | Status: AC
  Filled 2015-05-18: qty 1

## 2015-05-18 MED ORDER — DEXAMETHASONE SODIUM PHOSPHATE 4 MG/ML IJ SOLN
INTRAMUSCULAR | Status: DC | PRN
  Administered 2015-05-18: 8 mg via INTRAVENOUS

## 2015-05-18 MED ORDER — HYDROMORPHONE HCL 1 MG/ML IJ SOLN
0.5000 mg | INTRAMUSCULAR | Status: DC | PRN
  Administered 2015-05-18 – 2015-05-19 (×3): 1 mg via INTRAVENOUS
  Filled 2015-05-18 (×3): qty 1

## 2015-05-18 MED ORDER — VERAPAMIL HCL ER 180 MG PO TBCR
180.0000 mg | EXTENDED_RELEASE_TABLET | Freq: Two times a day (BID) | ORAL | Status: DC
  Administered 2015-05-18 – 2015-05-19 (×2): 180 mg via ORAL
  Filled 2015-05-18 (×6): qty 1

## 2015-05-18 MED ORDER — EPHEDRINE SULFATE 50 MG/ML IJ SOLN
INTRAMUSCULAR | Status: DC | PRN
  Administered 2015-05-18: 10 mg via INTRAVENOUS

## 2015-05-18 MED ORDER — CITALOPRAM HYDROBROMIDE 20 MG PO TABS
40.0000 mg | ORAL_TABLET | Freq: Every day | ORAL | Status: DC
Start: 2015-05-18 — End: 2015-05-19
  Administered 2015-05-18 – 2015-05-19 (×2): 40 mg via ORAL
  Filled 2015-05-18 (×2): qty 2

## 2015-05-18 MED ORDER — NEOSTIGMINE METHYLSULFATE 10 MG/10ML IV SOLN
INTRAVENOUS | Status: AC
  Filled 2015-05-18: qty 1

## 2015-05-18 MED ORDER — PROMETHAZINE HCL 25 MG/ML IJ SOLN: 6.2500 mg | INTRAMUSCULAR | Status: DC | PRN

## 2015-05-18 MED ORDER — SODIUM CHLORIDE 0.9 % IV SOLN
INTRAVENOUS | Status: AC
  Administered 2015-05-18: 1000 mL
  Filled 2015-05-18: qty 1

## 2015-05-18 MED ORDER — PANTOPRAZOLE SODIUM 20 MG PO TBEC
20.0000 mg | DELAYED_RELEASE_TABLET | Freq: Every day | ORAL | Status: DC
  Administered 2015-05-18: 20 mg via ORAL
  Filled 2015-05-18 (×3): qty 1

## 2015-05-18 MED ORDER — DEXTROSE-NACL 5-0.45 % IV SOLN
INTRAVENOUS | Status: DC
  Administered 2015-05-18 – 2015-05-19 (×2): via INTRAVENOUS

## 2015-05-18 MED ORDER — MIDAZOLAM HCL 2 MG/2ML IJ SOLN
INTRAMUSCULAR | Status: AC
  Filled 2015-05-18: qty 4

## 2015-05-18 MED ORDER — PROPOFOL 500 MG/50ML IV EMUL
INTRAVENOUS | Status: DC | PRN
  Administered 2015-05-18: 25 ug/kg/min via INTRAVENOUS

## 2015-05-18 MED ORDER — ALPRAZOLAM 0.25 MG PO TABS
0.1250 mg | ORAL_TABLET | Freq: Two times a day (BID) | ORAL | Status: DC
  Administered 2015-05-19: 0.125 mg via ORAL
  Filled 2015-05-18: qty 1

## 2015-05-18 MED ORDER — ONDANSETRON HCL 4 MG/2ML IJ SOLN
INTRAMUSCULAR | Status: DC | PRN
  Administered 2015-05-18 (×2): 4 mg via INTRAVENOUS

## 2015-05-18 MED ORDER — FENTANYL CITRATE (PF) 250 MCG/5ML IJ SOLN
INTRAMUSCULAR | Status: AC
  Filled 2015-05-18: qty 5

## 2015-05-18 MED ORDER — NEOSTIGMINE METHYLSULFATE 10 MG/10ML IV SOLN
INTRAVENOUS | Status: DC | PRN
  Administered 2015-05-18: 3 mg via INTRAVENOUS

## 2015-05-18 MED ORDER — LISINOPRIL 5 MG PO TABS
5.0000 mg | ORAL_TABLET | Freq: Every day | ORAL | Status: DC
  Administered 2015-05-18: 5 mg via ORAL
  Filled 2015-05-18 (×2): qty 1

## 2015-05-18 MED ORDER — HYDROMORPHONE HCL 2 MG PO TABS
ORAL_TABLET | ORAL | Status: AC
  Filled 2015-05-18: qty 1

## 2015-05-18 MED ORDER — HYDROMORPHONE HCL 1 MG/ML IJ SOLN
INTRAMUSCULAR | Status: AC
  Filled 2015-05-18: qty 1

## 2015-05-18 MED ORDER — ENOXAPARIN SODIUM 40 MG/0.4ML ~~LOC~~ SOLN
40.0000 mg | SUBCUTANEOUS | Status: DC
  Administered 2015-05-19: 40 mg via SUBCUTANEOUS
  Filled 2015-05-18: qty 0.4

## 2015-05-18 MED ORDER — HYDROMORPHONE HCL 1 MG/ML IJ SOLN
0.2500 mg | INTRAMUSCULAR | Status: DC | PRN
  Administered 2015-05-18 (×4): 0.5 mg via INTRAVENOUS

## 2015-05-18 MED ORDER — GLYCOPYRROLATE 0.2 MG/ML IJ SOLN
INTRAMUSCULAR | Status: AC
  Filled 2015-05-18: qty 2

## 2015-05-18 MED ORDER — PROPOFOL 10 MG/ML IV BOLUS
INTRAVENOUS | Status: DC | PRN
  Administered 2015-05-18: 200 mg via INTRAVENOUS
  Administered 2015-05-18: 40 mg via INTRAVENOUS

## 2015-05-18 MED ORDER — DEXAMETHASONE SODIUM PHOSPHATE 4 MG/ML IJ SOLN
INTRAMUSCULAR | Status: AC
  Filled 2015-05-18: qty 2

## 2015-05-18 MED ORDER — DIPHENHYDRAMINE HCL 50 MG/ML IJ SOLN
INTRAMUSCULAR | Status: DC | PRN
  Administered 2015-05-18: 12.5 mg via INTRAVENOUS

## 2015-05-18 SURGICAL SUPPLY — 56 items
ADH SKN CLS APL DERMABOND .7 (GAUZE/BANDAGES/DRESSINGS) ×1
APPLIER CLIP 9.375 MED OPEN (MISCELLANEOUS)
APR CLP MED 9.3 20 MLT OPN (MISCELLANEOUS)
ATCH SMKEVC FLXB CAUT HNDSWH (FILTER) ×1 IMPLANT
BAG DECANTER FOR FLEXI CONT (MISCELLANEOUS) ×2 IMPLANT
BINDER BREAST LRG (GAUZE/BANDAGES/DRESSINGS) ×2 IMPLANT
BIOPATCH RED 1 DISK 7.0 (GAUZE/BANDAGES/DRESSINGS) ×2 IMPLANT
BLADE 10 SAFETY STRL DISP (BLADE) ×2 IMPLANT
CANISTER SUCTION 2500CC (MISCELLANEOUS) ×2 IMPLANT
CHLORAPREP W/TINT 26ML (MISCELLANEOUS) ×2 IMPLANT
CLIP APPLIE 9.375 MED OPEN (MISCELLANEOUS) IMPLANT
COVER SURGICAL LIGHT HANDLE (MISCELLANEOUS) ×2 IMPLANT
DERMABOND ADVANCED (GAUZE/BANDAGES/DRESSINGS) ×1
DERMABOND ADVANCED .7 DNX12 (GAUZE/BANDAGES/DRESSINGS) ×1 IMPLANT
DRAIN CHANNEL 19F RND (DRAIN) ×4 IMPLANT
DRAPE ORTHO SPLIT 77X108 STRL (DRAPES) ×4
DRAPE PROXIMA HALF (DRAPES) ×6 IMPLANT
DRAPE SURG 17X23 STRL (DRAPES) ×4 IMPLANT
DRAPE SURG ORHT 6 SPLT 77X108 (DRAPES) ×2 IMPLANT
DRAPE WARM FLUID 44X44 (DRAPE) ×2 IMPLANT
DRSG PAD ABDOMINAL 8X10 ST (GAUZE/BANDAGES/DRESSINGS) ×2 IMPLANT
DRSG SORBAVIEW 3.5X5-5/16 MED (GAUZE/BANDAGES/DRESSINGS) ×2 IMPLANT
ELECT BLADE 6.5 EXT (BLADE) IMPLANT
ELECT CAUTERY BLADE 6.4 (BLADE) ×2 IMPLANT
ELECT REM PT RETURN 9FT ADLT (ELECTROSURGICAL) ×2
ELECTRODE REM PT RTRN 9FT ADLT (ELECTROSURGICAL) ×1 IMPLANT
EVACUATOR SILICONE 100CC (DRAIN) ×4 IMPLANT
EVACUATOR SMOKE ACCUVAC VALLEY (FILTER) ×1
GLOVE BIO SURGEON STRL SZ7.5 (GLOVE) ×2 IMPLANT
GLOVE BIOGEL PI IND STRL 7.5 (GLOVE) ×1 IMPLANT
GLOVE BIOGEL PI IND STRL 8 (GLOVE) ×1 IMPLANT
GLOVE BIOGEL PI INDICATOR 7.5 (GLOVE) ×1
GLOVE BIOGEL PI INDICATOR 8 (GLOVE) ×1
GOWN STRL REUS W/ TWL LRG LVL3 (GOWN DISPOSABLE) ×1 IMPLANT
GOWN STRL REUS W/ TWL XL LVL3 (GOWN DISPOSABLE) ×1 IMPLANT
GOWN STRL REUS W/TWL LRG LVL3 (GOWN DISPOSABLE) ×2
GOWN STRL REUS W/TWL XL LVL3 (GOWN DISPOSABLE) ×2
IMPL BREAST ARTOURA 500ML (Breast) ×1 IMPLANT
IMPLANT BREAST ARTOURA 500ML (Breast) ×2 IMPLANT
KIT BASIN OR (CUSTOM PROCEDURE TRAY) ×2 IMPLANT
KIT ROOM TURNOVER OR (KITS) ×2 IMPLANT
MARKER SKIN DUAL TIP RULER LAB (MISCELLANEOUS) ×2 IMPLANT
NS IRRIG 1000ML POUR BTL (IV SOLUTION) ×4 IMPLANT
PACK GENERAL/GYN (CUSTOM PROCEDURE TRAY) ×2 IMPLANT
PAD ARMBOARD 7.5X6 YLW CONV (MISCELLANEOUS) ×2 IMPLANT
PREFILTER EVAC NS 1 1/3-3/8IN (MISCELLANEOUS) ×2 IMPLANT
SET ASEPTIC TRANSFER (MISCELLANEOUS) ×2 IMPLANT
SUT MNCRL AB 3-0 PS2 18 (SUTURE) ×8 IMPLANT
SUT PDS AB 3-0 SH 27 (SUTURE) IMPLANT
SUT PROLENE 3 0 PS 2 (SUTURE) ×4 IMPLANT
SUT VIC AB 3-0 SH 18 (SUTURE) ×2 IMPLANT
SYR BULB IRRIGATION 50ML (SYRINGE) ×2 IMPLANT
TOWEL OR 17X24 6PK STRL BLUE (TOWEL DISPOSABLE) ×2 IMPLANT
TOWEL OR 17X26 10 PK STRL BLUE (TOWEL DISPOSABLE) ×2 IMPLANT
TRAY FOLEY CATH 16FR SILVER (SET/KITS/TRAYS/PACK) IMPLANT
TUBE CONNECTING 12X1/4 (SUCTIONS) ×2 IMPLANT

## 2015-05-18 NOTE — H&P (Signed)
I have re-examined and re-evaluated the patient and there are no changes. See office notes for H&P in paper chart.

## 2015-05-18 NOTE — Anesthesia Preprocedure Evaluation (Addendum)
Anesthesia Evaluation  Patient identified by MRN, date of birth, ID band Patient awake    Reviewed: Allergy & Precautions, NPO status , Patient's Chart, lab work & pertinent test results  History of Anesthesia Complications (+) PONV and history of anesthetic complications  Airway Mallampati: II  TM Distance: >3 FB Neck ROM: Full    Dental  (+) Teeth Intact, Dental Advisory Given   Pulmonary former smoker,    Pulmonary exam normal        Cardiovascular hypertension, Pt. on medications and Pt. on home beta blockers Normal cardiovascular exam     Neuro/Psych Anxiety S/P excision of Meningioma  Neuromuscular disease    GI/Hepatic Neg liver ROS, hiatal hernia, GERD  ,  Endo/Other  negative endocrine ROS  Renal/GU negative Renal ROS     Musculoskeletal   Abdominal   Peds  Hematology   Anesthesia Other Findings   Reproductive/Obstetrics                           Anesthesia Physical Anesthesia Plan  ASA: III  Anesthesia Plan: General   Post-op Pain Management:    Induction: Intravenous  Airway Management Planned: Oral ETT  Additional Equipment:   Intra-op Plan:   Post-operative Plan: Extubation in OR  Informed Consent: I have reviewed the patients History and Physical, chart, labs and discussed the procedure including the risks, benefits and alternatives for the proposed anesthesia with the patient or authorized representative who has indicated his/her understanding and acceptance.   Dental advisory given  Plan Discussed with: CRNA, Anesthesiologist and Surgeon  Anesthesia Plan Comments:        Anesthesia Quick Evaluation

## 2015-05-18 NOTE — Anesthesia Procedure Notes (Signed)
Procedure Name: Intubation Date/Time: 05/18/2015 1:09 PM Performed by: Julian Reil Pre-anesthesia Checklist: Patient identified, Emergency Drugs available, Suction available and Patient being monitored Patient Re-evaluated:Patient Re-evaluated prior to inductionOxygen Delivery Method: Circle system utilized Preoxygenation: Pre-oxygenation with 100% oxygen Intubation Type: IV induction Ventilation: Mask ventilation without difficulty Laryngoscope Size: Mac and 4 Grade View: Grade I Tube type: Oral Tube size: 7.0 mm Number of attempts: 1 Airway Equipment and Method: Stylet Placement Confirmation: ETT inserted through vocal cords under direct vision,  positive ETCO2 and breath sounds checked- equal and bilateral Secured at: 21 cm Tube secured with: Tape Dental Injury: Teeth and Oropharynx as per pre-operative assessment

## 2015-05-18 NOTE — Brief Op Note (Signed)
05/18/2015  2:57 PM  PATIENT:  Amy [name]  64 y.o. female  PRE-OPERATIVE DIAGNOSIS:  LEFT BREAST CANCER  POST-OPERATIVE DIAGNOSIS:  left breast cancer   PROCEDURE:  Procedure(s): LEFT BREAST RECONSTRUCTION WITH PLACEMENT OF TISSUE EXPANDER AND ACELLULAR HYDRATED DERMIS MATRIX (Left)  SURGEON:  Surgeon(s) and Role:    * [name], MD - Primary  PHYSICIAN ASSISTANT:   ASSISTANTS: Sharyn Dross, RNFA   ANESTHESIA:   general  EBL:  Total I/O In: 1200 [I.V.:1200] Out: -   BLOOD ADMINISTERED:none  DRAINS: (2) Jackson-Pratt drain(s) with closed bulb suction in the left chest (one in chronic seroma cavity under previous mastectomy flap)   LOCAL MEDICATIONS USED: None  SPECIMEN:  No Specimen  DISPOSITION OF SPECIMEN:  N/A  COUNTS:  YES  TOURNIQUET:  * No tourniquets in log *  DICTATION: .Other Dictation: Dictation Number [id number]  PLAN OF CARE: Admit for overnight observation  PATIENT DISPOSITION:  PACU - hemodynamically stable.   Delay start of Pharmacological VTE agent (>24hrs) due to surgical blood loss or risk of bleeding: no

## 2015-05-18 NOTE — Transfer of Care (Signed)
Immediate Anesthesia Transfer of Care Note  Patient: Amy [name]  Procedure(s) Performed: Procedure(s): LEFT BREAST RECONSTRUCTION WITH PLACEMENT OF TISSUE EXPANDER AND ACELLULAR HYDRATED DERMIS MATRIX (Left)  Patient Location: PACU  Anesthesia Type:General  Level of Consciousness: awake, alert  and oriented  Airway & Oxygen Therapy: Patient Spontanous Breathing and Patient connected to nasal cannula oxygen  Post-op Assessment: Report given to RN, Post -op Vital signs reviewed and stable and Patient moving all extremities X 4  Post vital signs: Reviewed and stable  Last Vitals:  Filed Vitals:   05/18/15 1505  BP: 130/74  Pulse: 74  Temp:   Resp: 19    Complications: No apparent anesthesia complications

## 2015-05-19 ENCOUNTER — Encounter (HOSPITAL_COMMUNITY): Payer: Self-pay | Admitting: Plastic Surgery

## 2015-05-19 DIAGNOSIS — Z421 Encounter for breast reconstruction following mastectomy: Secondary | ICD-10-CM | POA: Diagnosis not present

## 2015-05-19 MED ORDER — DOXYCYCLINE HYCLATE 100 MG PO TABS
100.0000 mg | ORAL_TABLET | Freq: Two times a day (BID) | ORAL | Status: DC
  Administered 2015-05-19: 100 mg via ORAL
  Filled 2015-05-19: qty 1

## 2015-05-19 MED ORDER — HYDROMORPHONE HCL 2 MG PO TABS: 2.0000 mg | ORAL_TABLET | ORAL | Status: DC | PRN

## 2015-05-19 MED ORDER — DOXYCYCLINE HYCLATE 100 MG PO TABS: 100.0000 mg | ORAL_TABLET | Freq: Two times a day (BID) | ORAL | Status: DC

## 2015-05-19 MED ORDER — DOCUSATE SODIUM 100 MG PO CAPS: 100.0000 mg | ORAL_CAPSULE | Freq: Every day | ORAL | Status: DC

## 2015-05-19 MED ORDER — METHOCARBAMOL 500 MG PO TABS: 500.0000 mg | ORAL_TABLET | Freq: Four times a day (QID) | ORAL | Status: DC

## 2015-05-19 NOTE — Discharge Instructions (Addendum)
No lifting for 6 weeks No vigorous activity for 6 weeks (including outdoor walks) No driving for 4 weeks OK to walk up stairs slowly Stay propped up Use incentive spirometer at home every hour while awake No shower while drains are in place Empty drains at least three times a day and record the amounts separately Change drain dressings every third day if instructed to do so by Dr. Harlow Mares (no need to start yet)  Apply Bacitracin antibiotic ointment to the drain sites  Place gauze dressing over drains  Secure the gauze with tape Take an over-the-counter Probiotic while on antibiotics Take an over-the-counter stool softener (such as Colace) while on pain medication See Dr. Harlow Mares next week For questions call [phone] or [phone]

## 2015-05-19 NOTE — Op Note (Signed)
NAMEJAYNE, PECKENPAUGH           ACCOUNT NO.:  [id number]  MEDICAL RECORD NO.:  [id number]  LOCATION:  [id number]                        FACILITY:  [facility]  PHYSICIAN:  [name], M.D.     DATE OF BIRTH:  [birth year]  DATE OF PROCEDURE:  05/18/2015 DATE OF DISCHARGE:                              OPERATIVE REPORT   PREOPERATIVE DIAGNOSIS:  Left breast cancer.  POSTOPERATIVE DIAGNOSES: 1. Left breast cancer. 2. A retained chronic seroma cavity.  PROCEDURE PERFORMED: 1. Left delayed breast reconstruction with tissue expander. 2. Capsulectomy and ablation of chronic seroma cavity.  SURGEON:  [name], M.D.  ASSISTANT:  Ina Kick, RNFA.  ANESTHESIA:  General.  ESTIMATED BLOOD LOSS:  5 mL.  DRAINS:  Two 19-French, left chest; one of which was placed into the chronic seroma cavity.  CLINICAL NOTE:  This 64 year old woman has had left breast cancer and has had mastectomy.  She has completed her chemotherapy and presents for left breast reconstruction.  Options were discussed, and she selected placement of a tissue expander as a stage procedure for eventual placement of her implant.  The nature of this procedure and risks plus complications were discussed with her in great detail.  These risks included, but not limited to, bleeding, infection, healing problems, scarring, loss of sensation, fluid accumulations, anesthesia-related complications, contour deformities at the periphery of reconstruction, pneumothorax, DVT, PE, failure of device, capsular contracture, displacement of device, wrinkles and ripples, and asymmetry, and she understood all this and wished to proceed.  She also understood that she would probably desire a procedure for symmetry on the opposite side.  DESCRIPTION OF PROCEDURE:  The patient was marked in the holding area in a full standing position for the breast reconstruction.  She was then taken to the operating room and placed supine.  After  successful induction of general anesthesia, she was prepped with ChloraPrep and after waiting full 3 minutes for drying, she was draped with sterile drapes.  The old mastectomy scar was utilized at the lateral aspect, and the dissection carried down through the subcutaneous tissue.  A chronic seroma cavity was encountered that was not apparent preoperative.  This was excised, excising the capsule surrounding it.  What portions of it could not be excised were ablated using electrocautery.  The dissection was then carried deep to the pectoralis muscle and the serratus anterior was elevated for short distance lateral.  A space was created to the dimensions of the planned implant and great care was taken throughout the dissection to avoid damage to underlying chest cavity.  Dissection was continued inferiorly to the preoperative marking and then placed at the planned inframammary crease.  Thorough irrigation with saline, and meticulous hemostasis was achieved using the electrocautery.  Excellent hemostasis had been achieved.  The expander was prepared after thoroughly cleaning gloves.  This was a Mentor ARTOURA 500 mL tissue expander.  A 100 mL sterile saline placed using a closed filling system and the expander was returned to the antibiotic solution.  The space was again inspected.  Excellent hemostasis was confirmed.  The expander was positioned after again placing antibiotic solution in the space just prior to positioning.  A drain was positioned prior to placing  the tissue expander.  This drain was brought out through a separate stab wound inferolaterally and secured with a 3-0 Prolene sutures.  With the expander in position, the lateral tab was secured using 3-0 Vicryl suture to the underlying soft tissues in order to avoid rotation of the device.  Antibiotic solution was again placed over the expander and the muscle was then closed over the expander using 3-0 Vicryl simple interrupted  sutures with great care taken to avoid damage to the underlying tissue expander, which was kept under direct vision at all times.  One of the drains was then placed into the chronic seroma cavity, brought through a separate stab wound inferolaterally and secured with 3-0 Prolene suture, and then the skin closure with 3-0 Monocryl interrupted inverted deep dermal sutures after irrigating the wound with antibiotic solution.  A 4-0 Monocryl running subcuticular suture completed the closure.  Dermabond dry sterile dressing and the drain was dressed with Biopatch and SorbaView dressings, and she was placed in the chest vest.  She was transferred to the recovery room stable having tolerated the procedure well.     [name], M.D.     DB/MEDQ  D:  05/18/2015  T:  05/19/2015  Job:  [id number]  cc:   Shanon Brow M.D. [place], QMK#[phone]

## 2015-05-19 NOTE — Discharge Summary (Signed)
Physician Discharge Summary  Patient ID: Amy [name] MRN: [id number] DOB/AGE: 62-Nov-1952 62 y.o.  Admit date: 05/18/2015 Discharge date: 05/19/2015  Admission Diagnoses:Left breast cancer  Discharge Diagnoses: Same Active Problems:   Breast cancer, female, left   Discharged Condition: good  Hospital Course: On the day of admission the patient was taken to surgery and had left breast reconstruction with tissue expander as a staged procedure. The patient tolerated the procedures well. Postoperatively, the skin maintained excellent color and capillary refill. The patient was ambulatory and tolerating diet on the first postoperative day. She is ready for discharge.  Treatments: antibiotics: Ancef, anticoagulation: LMW heparin and surgery: left chest tissue expander  Discharge Exam: Blood pressure 120/79, pulse 89, temperature 98.7 F (37.1 C), temperature source Oral, resp. rate 18, height 5\' 6"  (1.676 [name]), weight 156 lb 15.5 oz (71.2 kg), SpO2 89 %.  Operative sites: Mastectomy flaps viable. Tissue expander  In good position. Drains functioning. Drainage thin. There is no evidence of bleeding or infection.  Disposition: 01-Home or Self Care     Medication List    STOP taking these medications        cetirizine 10 MG tablet  Commonly known as:  ZYRTEC     HYDROcodone-acetaminophen 5-325 MG tablet  Commonly known as:  NORCO/VICODIN     phenylephrine 10 MG Tabs tablet  Commonly known as:  SUDAFED PE      TAKE these medications        ALPRAZolam 0.25 MG tablet  Commonly known as:  XANAX  TAKE 1 TABLET BY MOUTH 2 TIMES A DAY AS NEEDED     anastrozole 1 MG tablet  Commonly known as:  ARIMIDEX  Take 1 tablet (1 mg total) by mouth daily.     citalopram 40 MG tablet  Commonly known as:  CELEXA  TAKE 1 TABLET (40 MG TOTAL) BY MOUTH DAILY.     docusate sodium 100 MG capsule  Commonly known as:  COLACE  Take 1 capsule (100 mg total) by mouth daily.     doxycycline  100 MG tablet  Commonly known as:  VIBRA-TABS  Take 1 tablet (100 mg total) by mouth every 12 (twelve) hours.     HYDROmorphone 2 MG tablet  Commonly known as:  DILAUDID  Take 1-2 tablets (2-4 mg total) by mouth every 4 (four) hours as needed for moderate pain.     lansoprazole 15 MG capsule  Commonly known as:  PREVACID  Take 30 mg by mouth daily at 12 noon.     lisinopril 5 MG tablet  Commonly known as:  PRINIVIL,ZESTRIL  Take 1 tablet (5 mg total) by mouth daily.     methocarbamol 500 MG tablet  Commonly known as:  ROBAXIN  Take 1 tablet (500 mg total) by mouth 4 (four) times daily.     metoprolol succinate 100 MG 24 hr tablet  Commonly known as:  TOPROL-XL  Take 1 tablet (100 mg total) by mouth daily. Take with or immediately following a meal.     verapamil 180 MG 24 hr capsule  Commonly known as:  VERELAN PM  TAKE 1 CAPSULE (180 MG TOTAL) BY MOUTH 2 (TWO) TIMES DAILY.     zolpidem 10 MG tablet  Commonly known as:  AMBIEN  TAKE 1 TABLET BY MOUTH AT BEDTIME.         SignedHarlow Mares, Amy [name] 05/19/2015, 8:21 AM

## 2015-05-22 NOTE — Anesthesia Postprocedure Evaluation (Signed)
Anesthesia Post Note  Patient: Amy [name]  Procedure(s) Performed: Procedure(s) (LRB): LEFT BREAST RECONSTRUCTION WITH PLACEMENT OF TISSUE EXPANDER AND ACELLULAR HYDRATED DERMIS MATRIX (Left)  Anesthesia type: general  Patient location: PACU  Post pain: Pain level controlled  Post assessment: Patient's Cardiovascular Status Stable  Post vital signs: Reviewed and stable  Level of consciousness: sedated  Complications: No apparent anesthesia complications

## 2015-05-25 ENCOUNTER — Other Ambulatory Visit: Payer: Self-pay | Admitting: Physician Assistant

## 2015-05-26 NOTE — Telephone Encounter (Signed)
rx printed.  Meds ordered this encounter  Medications  . zolpidem (AMBIEN) 10 MG tablet    Sig: TAKE 1 TABLET AT BEDTIME    Dispense:  30 tablet    Refill:  0    Not to exceed 5 additional fills before 10/25/2015

## 2015-05-28 ENCOUNTER — Telehealth: Payer: Self-pay

## 2015-05-28 NOTE — Telephone Encounter (Signed)
Pt is currently out of the zolpidem (AMBIEN) 10 MG tablet [[id number]] she has had surgery and needs this medication to sleep please.

## 2015-05-28 NOTE — Telephone Encounter (Signed)
Rx printed on 10/14, but we have not had a team leader since then, so it has not been called/faxed to her pharmacy.  Faxed to pharmacy: [phone]  Notified patient by phone.

## 2015-05-29 ENCOUNTER — Encounter: Payer: Self-pay | Admitting: *Deleted

## 2015-05-29 DIAGNOSIS — C50412 Malignant neoplasm of upper-outer quadrant of left female breast: Secondary | ICD-10-CM

## 2015-05-30 ENCOUNTER — Telehealth: Payer: Self-pay | Admitting: Hematology and Oncology

## 2015-05-30 NOTE — Telephone Encounter (Signed)
Called and left a message with survivorship appt

## 2015-05-31 ENCOUNTER — Encounter (HOSPITAL_COMMUNITY): Payer: Self-pay | Admitting: Plastic Surgery

## 2015-06-09 ENCOUNTER — Telehealth: Payer: Self-pay | Admitting: *Deleted

## 2015-06-09 NOTE — Telephone Encounter (Signed)
Received call from patient's husband stating she has been having nightmares since she has been on this medication.  Instructed him that she could stop for a couple of weeks and see if they get better and then call me back and if they persists we can switch her to something else.  Rob verbalized understanding.

## 2015-06-15 ENCOUNTER — Other Ambulatory Visit: Payer: Self-pay | Admitting: Physician Assistant

## 2015-06-16 NOTE — Telephone Encounter (Signed)
Meds ordered this encounter  Medications  . ALPRAZolam (XANAX) 0.25 MG tablet    Sig: TAKE 1 TABLET TWICE A DAY AS NEEDED    Dispense:  60 tablet    Refill:  0    Not to exceed 5 additional fills before 10/16/2015

## 2015-06-16 NOTE — Telephone Encounter (Signed)
Faxed

## 2015-06-19 ENCOUNTER — Other Ambulatory Visit: Payer: Self-pay | Admitting: *Deleted

## 2015-06-19 DIAGNOSIS — C50412 Malignant neoplasm of upper-outer quadrant of left female breast: Secondary | ICD-10-CM

## 2015-06-19 MED ORDER — LETROZOLE 2.5 MG PO TABS: 2.5000 mg | ORAL_TABLET | Freq: Every day | ORAL | Status: DC

## 2015-06-19 NOTE — Telephone Encounter (Signed)
Received call from patient's husband regarding the anastrozole.  He states it is giving Amy [name] nightmares.  Instructed her to stop for a couple of weeks and see if the nightmares go away and call me back.    He states the nightmares did go away.  Informed him I would call in Femara for her and to let us know how she is on this.  He verbalized understanding.

## 2015-06-23 ENCOUNTER — Telehealth: Payer: Self-pay | Admitting: Physician Assistant

## 2015-06-25 ENCOUNTER — Telehealth: Payer: Self-pay | Admitting: Family Medicine

## 2015-06-25 MED ORDER — ZOLPIDEM TARTRATE 10 MG PO TABS: 10.0000 mg | ORAL_TABLET | Freq: Every day | ORAL | Status: DC

## 2015-06-25 NOTE — Telephone Encounter (Signed)
Pt is requesting a refill of ambien

## 2015-06-25 NOTE — Telephone Encounter (Signed)
Received call from pt's husband- they need her Lorrin Mais refilled before he leaves town tomorrow.  It does appear that she has been receiving this on a regular basis and at the 10 mg dose for some time.  Chelle pended an rx a couple of days ago but it was not sent in yet

## 2015-06-26 NOTE — Telephone Encounter (Signed)
Patient callback # [phone]

## 2015-06-26 NOTE — Telephone Encounter (Signed)
Pt called in during epic outage 06/25/15, needs Ambien refilled

## 2015-06-27 NOTE — Telephone Encounter (Signed)
This was addressed by Dr. Lorelei Pont in my absence.

## 2015-07-04 ENCOUNTER — Encounter: Payer: BLUE CROSS/BLUE SHIELD | Admitting: Nurse Practitioner

## 2015-07-05 ENCOUNTER — Telehealth: Payer: Self-pay | Admitting: *Deleted

## 2015-07-05 NOTE — Telephone Encounter (Signed)
Lm for rtn call on dedicated VM.  Advised pt to rtn call to reschedule Survivorship Appointment or we can mail Care Plan to her.

## 2015-07-10 ENCOUNTER — Encounter: Payer: Self-pay | Admitting: Internal Medicine

## 2015-07-12 ENCOUNTER — Encounter: Payer: Self-pay | Admitting: Nurse Practitioner

## 2015-07-12 ENCOUNTER — Telehealth: Payer: Self-pay | Admitting: Adult Health

## 2015-07-12 DIAGNOSIS — C50412 Malignant neoplasm of upper-outer quadrant of left female breast: Secondary | ICD-10-CM

## 2015-07-12 NOTE — Progress Notes (Signed)
The Survivorship Care [name] was mailed to Amy [name] as she was unable to come in to the Survivorship Clinic for an in-person visit at this time. A letter was mailed to her outlining the purpose of the content of the care [name], as well as encouraging her to reach out to me with any questions or concerns.  My business card was included in the correspondence to the patient as well.  A copy of the care [name] was also routed/faxed/mailed to Amy [name], Amy Ham, Amy [name], the patient's PCP.  I will not be placing any follow-up appointments to the Survivorship Clinic for Amy [name], but I am happy to see her at any time in the future for any survivorship concerns that may arise. Thank you for allowing me to participate in her care!  Kenn File, [place] [phone]

## 2015-07-12 NOTE — Telephone Encounter (Signed)
Ms. Mondo called me regarding her missed appointment on 07/04/15, where she was scheduled to see Chestine Spore, NP for her survivorship care plan visit.  She apologized for missing this appointment.  I let her know that, according to Heather's notes, her care plan was mailed to her today and should arrive to her home within the next few days.  I encouraged her to review those documents/resources and to please give [place] a call if she decides she wants to be seen in survivorship and/or if she has any questions.  She voiced appreciation for our understanding in her missing the appointment and expressed gratitude to everyone who has cared for her. "Margarita Sermons have been angels to me and I am so grateful."  I wished her well and again, encouraged her to call us at any time in the future, as needed.   I will route this message to Chestine Spore, NP so that she is aware of this patient's conversation.   Mike Craze, NP [place] [phone]

## 2015-07-21 ENCOUNTER — Other Ambulatory Visit: Payer: Self-pay | Admitting: Family Medicine

## 2015-07-24 ENCOUNTER — Telehealth: Payer: Self-pay

## 2015-07-24 NOTE — Telephone Encounter (Signed)
Meds ordered this encounter  Medications  . zolpidem (AMBIEN) 10 MG tablet    Sig: TAKE 1 TABLET AT BEDTIME    Dispense:  30 tablet    Refill:  0    Not to exceed 5 additional fills before 12/22/2015

## 2015-07-24 NOTE — Telephone Encounter (Signed)
Pt husband was checking on the status of Ambien. He said that she is out and please call her cell phone when ready.  [phone]

## 2015-07-25 NOTE — Assessment & Plan Note (Signed)
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

## 2015-07-25 NOTE — Telephone Encounter (Signed)
Rx faxed in.

## 2015-07-26 ENCOUNTER — Ambulatory Visit (HOSPITAL_BASED_OUTPATIENT_CLINIC_OR_DEPARTMENT_OTHER): Payer: BLUE CROSS/BLUE SHIELD | Admitting: Hematology and Oncology

## 2015-07-26 ENCOUNTER — Encounter: Payer: Self-pay | Admitting: Hematology and Oncology

## 2015-07-26 ENCOUNTER — Other Ambulatory Visit: Payer: Self-pay | Admitting: Physician Assistant

## 2015-07-26 VITALS — BP 124/77 | HR 86 | Temp 98.4°F | Resp 18 | Ht 66.0 in | Wt 156.7 lb

## 2015-07-26 DIAGNOSIS — C50412 Malignant neoplasm of upper-outer quadrant of left female breast: Secondary | ICD-10-CM

## 2015-07-26 DIAGNOSIS — R52 Pain, unspecified: Secondary | ICD-10-CM | POA: Diagnosis not present

## 2015-07-26 NOTE — Telephone Encounter (Signed)
Meds ordered this encounter  Medications  . ALPRAZolam (XANAX) 0.25 MG tablet    Sig: TAKE 1 TABLET TWICE A DAY AS NEEDED    Dispense:  60 tablet    Refill:  0    Not to exceed 5 additional fills before 12/13/2015

## 2015-07-26 NOTE — Progress Notes (Signed)
Patient Care Team: Leandrew Koyanagi, MD as PCP - General (Family Medicine) Fanny Skates, MD as Consulting Physician (General Surgery) Nicholas Lose, MD as Consulting Physician (Hematology and Oncology) Arloa Koh, MD as Consulting Physician (Radiation Oncology) Mauro Kaufmann, RN as Registered Nurse Rockwell Germany, RN as Registered Nurse Holley Bouche, NP as Nurse Practitioner (Nurse Practitioner) Sylvan Cheese, NP as Nurse Practitioner (Hematology and Oncology)  DIAGNOSIS: Breast cancer of upper-outer quadrant of left female breast [facility])   Staging form: Breast, AJCC 7th Edition     Clinical stage from 09/14/2014: Stage IIB (T3, N0, M0) - Unsigned     Pathologic stage from 09/26/2014: Stage IIB (T3, N0, cM0) - Signed by Seward Grater, MD on 10/04/2014       Staging comments: Staged on final mastectomy by Dr. Avis Epley.    SUMMARY OF ONCOLOGIC HISTORY:   Breast cancer of upper-outer quadrant of left female breast ([place])   08/23/2014 Mammogram Left breast: possible mass warranting further imaging   08/30/2014 Breast US Left breast: solid heterogeneous mass centered at 3:00, 2 cm from the nipple and extending into the retroareolar region. Mass is greater than 5 cm.   09/06/2014 Initial Biopsy Left breast 3:00 biopsy: Invasive ductal carcinoma with abundant extracellular mucin, ER+ (97%), PR+ (98%), Ki-67 47%, HER-2 negative (ratio 1.28)   09/12/2014 Breast MRI Left breast: 6.5 x 5.3 x 5.1 cm irregular enhancing mass with 2 adjacent 8 mm satellite masses, no lymphadenopathy   09/14/2014 Clinical Stage Stage IIB: T3 M0   09/23/2014 Definitive Surgery Left Mastectomy/[facility]): IDC grade 2 with extracellular mucin 6.2 cm, 4 LN Neg, ER 97%, PR 98%, Her 2 Neg ratio 1.28, Ki 67 47%   09/23/2014 Pathologic Stage Stage IIB: T3 N0   10/20/2014 - 03/02/2015 Chemotherapy Adjuvant chemotherapy with dose dense Adriamycin and Cytoxan 4 followed by Abraxane weekly 12, refused radiation   Radiation Therapy Pt declined   03/27/2015 -  Anti-estrogen oral therapy Anastrozole 1 mg daily. Planned duration of therapy 5-10 years.   05/18/2015 Surgery Left delayed breast reconstruction with tissue expander. Capsulectomy and ablation of chronic seroma cavity [facility])   07/12/2015 Survivorship Survivorship care plan completed and mailed to patient in lieu of in person visit    CHIEF COMPLIANT: follow-up on anastrozole  INTERVAL HISTORY: Amy [name] is a 63 year old with above-mentioned history of left breast cancer who underwent mastectomy followed by adjuvant chemotherapy. She refuses radiation therapy. She was started on antiestrogen therapy on 03/27/2015. She appeared to tolerate this very well. She had delayed reconstruction in the left breast with tissue expander. She is here for a follow-up on anastrozole therapy.  REVIEW OF SYSTEMS:   Constitutional: Denies fevers, chills or abnormal weight loss Eyes: Denies blurriness of vision Ears, nose, mouth, throat, and face: Denies mucositis or sore throat Respiratory: Denies cough, dyspnea or wheezes Cardiovascular: Denies palpitation, chest discomfort or lower extremity swelling Gastrointestinal:  Denies nausea, heartburn or change in bowel habits Skin: Denies abnormal skin rashes Lymphatics: Denies new lymphadenopathy or easy bruising Neurological:Denies numbness, tingling or new weaknesses Behavioral/Psych: Mood is stable, no new changes  Breast:  denies any pain or lumps or nodules in either breasts All other systems were reviewed with the patient and are negative.  I have reviewed the past medical history, past surgical history, social history and family history with the patient and they are unchanged from previous note.  ALLERGIES:  is allergic to codeine; darvocet; tramadol; gold-containing drug products; and  sulfa antibiotics.  MEDICATIONS:  Current Outpatient Prescriptions  Medication Sig Dispense Refill  .  ALPRAZolam (XANAX) 0.25 MG tablet TAKE 1 TABLET TWICE A DAY AS NEEDED 60 tablet 0  . anastrozole (ARIMIDEX) 1 MG tablet Take 1 tablet (1 mg total) by mouth daily. 90 tablet 3  . citalopram (CELEXA) 40 MG tablet TAKE 1 TABLET (40 MG TOTAL) BY MOUTH DAILY. 90 tablet 3  . docusate sodium (COLACE) 100 MG capsule Take 1 capsule (100 mg total) by mouth daily. 10 capsule 0  . doxycycline (VIBRA-TABS) 100 MG tablet Take 1 tablet (100 mg total) by mouth every 12 (twelve) hours. 30 tablet 0  . HYDROmorphone (DILAUDID) 2 MG tablet Take 1-2 tablets (2-4 mg total) by mouth every 4 (four) hours as needed for moderate pain. 40 tablet 0  . lansoprazole (PREVACID) 15 MG capsule Take 30 mg by mouth daily at 12 noon.    Marland Kitchen letrozole (FEMARA) 2.5 MG tablet Take 1 tablet (2.5 mg total) by mouth daily. 90 tablet 3  . lisinopril (PRINIVIL,ZESTRIL) 5 MG tablet Take 1 tablet (5 mg total) by mouth daily. 90 tablet 3  . methocarbamol (ROBAXIN) 500 MG tablet Take 1 tablet (500 mg total) by mouth 4 (four) times daily. 40 tablet 1  . metoprolol succinate (TOPROL-XL) 100 MG 24 hr tablet Take 1 tablet (100 mg total) by mouth daily. Take with or immediately following a meal. 90 tablet 3  . verapamil (VERELAN PM) 180 MG 24 hr capsule TAKE 1 CAPSULE (180 MG TOTAL) BY MOUTH 2 (TWO) TIMES DAILY. 180 capsule 3  . zolpidem (AMBIEN) 10 MG tablet TAKE 1 TABLET AT BEDTIME 30 tablet 0   No current facility-administered medications for this visit.    PHYSICAL EXAMINATION: ECOG PERFORMANCE STATUS: 1 - Symptomatic but completely ambulatory  Filed Vitals:   07/26/15 1134  BP: 124/77  Pulse: 86  Temp: 98.4 F (36.9 C)  Resp: 18   Filed Weights   07/26/15 1134  Weight: 156 lb 11.2 oz (71.079 kg)    GENERAL:alert, no distress and comfortable SKIN: skin color, texture, turgor are normal, no rashes or significant lesions EYES: normal, Conjunctiva are pink and non-injected, sclera clear OROPHARYNX:no exudate, no erythema and lips,  buccal mucosa, and tongue normal  NECK: supple, thyroid normal size, non-tender, without nodularity LYMPH:  no palpable lymphadenopathy in the cervical, axillary or inguinal LUNGS: clear to auscultation and percussion with normal breathing effort HEART: regular rate & rhythm and no murmurs and no lower extremity edema ABDOMEN:abdomen soft, non-tender and normal bowel sounds Musculoskeletal:no cyanosis of digits and no clubbing  NEURO: alert & oriented x 3 with fluent speech, no focal motor/sensory deficits  LABORATORY DATA:  I have reviewed the data as listed   Chemistry      Component Value Date/Time   NA 135 05/09/2015 1030   NA 139 03/02/2015 0852   K 4.0 05/09/2015 1030   K 4.1 03/02/2015 0852   CL 100* 05/09/2015 1030   CO2 27 05/09/2015 1030   CO2 27 03/02/2015 0852   BUN 8 05/09/2015 1030   BUN 4.6* 03/02/2015 0852   CREATININE 0.76 05/09/2015 1030   CREATININE 0.7 03/02/2015 0852   CREATININE 0.56 08/16/2014 1208      Component Value Date/Time   CALCIUM 9.3 05/09/2015 1030   CALCIUM 8.9 03/02/2015 0852   ALKPHOS 49 03/02/2015 0852   ALKPHOS 34* 09/20/2014 1553   AST 17 03/02/2015 0852   AST 18 09/20/2014 1553   ALT  14 03/02/2015 0852   ALT 11 09/20/2014 1553   BILITOT 0.30 03/02/2015 0852   BILITOT 0.2* 09/20/2014 1553       Lab Results  Component Value Date   WBC 6.6 05/09/2015   HGB 12.3 05/09/2015   HCT 37.9 05/09/2015   MCV 84.4 05/09/2015   PLT 408* 05/09/2015   NEUTROABS 1.7 03/02/2015   ASSESSMENT & PLAN:  Breast cancer of upper-outer quadrant of left female breast Left Mastectomy 09/23/14: IDC grade 2 with extracellular mucin 6.2 cm, 4 LN Neg, ER 97%, PR 98%, Her 2 Neg ratio 1.28, Ki 67 47% T3N0 Stage 2B  Treatment plan: adjuvant chemotherapy withDose dense Adriamycin and Cytoxan 4 followed by Abraxane weekly 12 started 10/20/14 completed 03/02/15 Declined XRT Started Anastrozole 03/27/15  Anastrozole Toxicities: 1. Hair loss 2. Fatigue 3.  Joint aches and pains  I encouraged her to take anastrozole at bedtime and see if her symptoms are any better. Patient stays very busy with dancing, helping her mother and other friends.  RTC in 6 months   No orders of the defined types were placed in this encounter.   The patient has a good understanding of the overall plan. she agrees with it. she will call with any problems that may develop before the next visit here.   Rulon Eisenmenger, MD 07/26/2015

## 2015-07-27 ENCOUNTER — Telehealth: Payer: Self-pay | Admitting: *Deleted

## 2015-07-27 NOTE — Telephone Encounter (Signed)
Received call from patient stating she is taking the Femara now and she still seems to be having the hair loss.  Suggested she take it at night and try some biotin to see if this helps.  She also has had some vaginal discharge which she has seen her PCP for.  Instructed her to call us back and let us know if it gets better.

## 2015-07-28 NOTE — Progress Notes (Signed)
This encounter was created in error - please disregard.

## 2015-07-28 NOTE — Telephone Encounter (Signed)
Faxed

## 2015-08-01 ENCOUNTER — Other Ambulatory Visit: Payer: Self-pay | Admitting: Physician Assistant

## 2015-08-21 ENCOUNTER — Other Ambulatory Visit: Payer: Self-pay | Admitting: Physician Assistant

## 2015-08-22 NOTE — Telephone Encounter (Signed)
Called into pharmacy

## 2015-08-22 NOTE — Telephone Encounter (Signed)
Rx printed at 104. Will bring to 102 after clinic.  Meds ordered this encounter  Medications  . zolpidem (AMBIEN) 10 MG tablet    Sig: TAKE 1 TABLET AT BEDTIME    Dispense:  30 tablet    Refill:  0    Not to exceed 5 additional fills before 01/21/2016

## 2015-08-24 DIAGNOSIS — C50112 Malignant neoplasm of central portion of left female breast: Secondary | ICD-10-CM | POA: Diagnosis not present

## 2015-08-24 DIAGNOSIS — G5762 Lesion of plantar nerve, left lower limb: Secondary | ICD-10-CM | POA: Diagnosis not present

## 2015-09-01 ENCOUNTER — Other Ambulatory Visit: Payer: Self-pay | Admitting: Physician Assistant

## 2015-09-04 NOTE — Telephone Encounter (Signed)
Meds ordered this encounter  Medications  . ALPRAZolam (XANAX) 0.25 MG tablet    Sig: TAKE 1 TABLET BY MOUTH TWICE A DAY AS NEEDED    Dispense:  60 tablet    Refill:  0    Not to exceed 5 additional fills before 01/24/2016

## 2015-09-05 NOTE — Telephone Encounter (Signed)
Faxed

## 2015-09-19 ENCOUNTER — Other Ambulatory Visit: Payer: Self-pay | Admitting: Physician Assistant

## 2015-09-19 NOTE — Telephone Encounter (Signed)
Rx printed at 104. Will bring to 102 after clinic.  Meds ordered this encounter  Medications  . zolpidem (AMBIEN) 10 MG tablet    Sig: TAKE 1 TABLET AT BEDTIME    Dispense:  30 tablet    Refill:  0    Not to exceed 5 additional fills before 02/18/2016

## 2015-09-20 NOTE — Telephone Encounter (Signed)
Faxed

## 2015-09-21 DIAGNOSIS — G5762 Lesion of plantar nerve, left lower limb: Secondary | ICD-10-CM | POA: Diagnosis not present

## 2015-09-22 ENCOUNTER — Other Ambulatory Visit: Payer: Self-pay

## 2015-09-22 DIAGNOSIS — Z1231 Encounter for screening mammogram for malignant neoplasm of breast: Secondary | ICD-10-CM

## 2015-09-22 DIAGNOSIS — G43509 Persistent migraine aura without cerebral infarction, not intractable, without status migrainosus: Secondary | ICD-10-CM | POA: Insufficient documentation

## 2015-09-27 DIAGNOSIS — Z9889 Other specified postprocedural states: Secondary | ICD-10-CM | POA: Insufficient documentation

## 2015-09-27 DIAGNOSIS — Z9882 Breast implant status: Secondary | ICD-10-CM

## 2015-09-29 DIAGNOSIS — G5761 Lesion of plantar nerve, right lower limb: Secondary | ICD-10-CM | POA: Diagnosis not present

## 2015-10-04 ENCOUNTER — Ambulatory Visit
Admission: RE | Admit: 2015-10-04 | Discharge: 2015-10-04 | Disposition: A | Discharge status: Home / Self Care | Payer: BLUE CROSS/BLUE SHIELD | Source: Ambulatory Visit

## 2015-10-04 DIAGNOSIS — Z1231 Encounter for screening mammogram for malignant neoplasm of breast: Secondary | ICD-10-CM

## 2015-10-05 DIAGNOSIS — L858 Other specified epidermal thickening: Secondary | ICD-10-CM | POA: Diagnosis not present

## 2015-10-05 DIAGNOSIS — D485 Neoplasm of uncertain behavior of skin: Secondary | ICD-10-CM | POA: Diagnosis not present

## 2015-10-11 ENCOUNTER — Other Ambulatory Visit: Payer: Self-pay | Admitting: Physician Assistant

## 2015-10-11 NOTE — Telephone Encounter (Signed)
Meds ordered this encounter  Medications  . ALPRAZolam (XANAX) 0.25 MG tablet    Sig: TAKE 1 TABLET BY MOUTH TWICE A DAY AS NEEDED    Dispense:  60 tablet    Refill:  0    Not to exceed 5 additional fills before 03/03/2016

## 2015-10-12 NOTE — Telephone Encounter (Signed)
Faxed

## 2015-10-18 ENCOUNTER — Other Ambulatory Visit: Payer: Self-pay | Admitting: Physician Assistant

## 2015-10-18 DIAGNOSIS — C50112 Malignant neoplasm of central portion of left female breast: Secondary | ICD-10-CM | POA: Diagnosis not present

## 2015-10-18 DIAGNOSIS — Z9882 Breast implant status: Secondary | ICD-10-CM | POA: Diagnosis not present

## 2015-10-19 DIAGNOSIS — M71572 Other bursitis, not elsewhere classified, left ankle and foot: Secondary | ICD-10-CM | POA: Diagnosis not present

## 2015-10-20 NOTE — Telephone Encounter (Signed)
Meds ordered this encounter  Medications  . zolpidem (AMBIEN) 10 MG tablet    Sig: TAKE 1 TABLET BY MOUTH AT BEDTIME    Dispense:  30 tablet    Refill:  0    Not to exceed 5 additional fills before 03/18/2016

## 2015-10-21 ENCOUNTER — Other Ambulatory Visit: Payer: Self-pay | Admitting: Physician Assistant

## 2015-10-24 DIAGNOSIS — C50112 Malignant neoplasm of central portion of left female breast: Secondary | ICD-10-CM | POA: Diagnosis not present

## 2015-10-24 DIAGNOSIS — N651 Disproportion of reconstructed breast: Secondary | ICD-10-CM | POA: Diagnosis not present

## 2015-10-24 DIAGNOSIS — Z853 Personal history of malignant neoplasm of breast: Secondary | ICD-10-CM | POA: Diagnosis not present

## 2015-10-24 DIAGNOSIS — Z9012 Acquired absence of left breast and nipple: Secondary | ICD-10-CM | POA: Diagnosis not present

## 2015-10-29 ENCOUNTER — Other Ambulatory Visit: Payer: Self-pay | Admitting: Physician Assistant

## 2015-11-11 HISTORY — PX: AUGMENTATION MAMMAPLASTY: SUR837

## 2015-11-16 ENCOUNTER — Other Ambulatory Visit: Payer: Self-pay | Admitting: Physician Assistant

## 2015-11-17 NOTE — Telephone Encounter (Signed)
Patient hasn't been seen in clinic since July.  Do you want her to RTC or refill?

## 2015-11-18 ENCOUNTER — Other Ambulatory Visit: Payer: Self-pay | Admitting: Physician Assistant

## 2015-11-18 NOTE — Telephone Encounter (Signed)
Refilled. Please advise her it's time for a visit.  Meds ordered this encounter  Medications  . zolpidem (AMBIEN) 10 MG tablet    Sig: TAKE 1 TABLET BY MOUTH AT BEDTIME    Dispense:  30 tablet    Refill:  0    Not to exceed 5 additional fills before 04/18/2016

## 2015-11-19 NOTE — Telephone Encounter (Signed)
I think this is a duplicate?  Printed Rx. Let her know she needs to see me for additional fills.  Meds ordered this encounter  Medications  . ALPRAZolam (XANAX) 0.25 MG tablet    Sig: TAKE 1 TABLET TWICE A DAY AS NEEDED    Dispense:  60 tablet    Refill:  0    Not to exceed 5 additional fills before 04/09/2016

## 2015-11-19 NOTE — Telephone Encounter (Signed)
Patient has not been seen since 7/16 do you want to RTC?

## 2015-11-20 NOTE — Telephone Encounter (Signed)
Faxed

## 2015-12-12 ENCOUNTER — Ambulatory Visit: Payer: Medicare Other | Admitting: Physician Assistant

## 2015-12-14 ENCOUNTER — Other Ambulatory Visit: Payer: Self-pay | Admitting: Physician Assistant

## 2015-12-16 NOTE — Telephone Encounter (Signed)
She has an scheduled appointment on 5/23

## 2015-12-18 ENCOUNTER — Other Ambulatory Visit: Payer: Self-pay | Admitting: Physician Assistant

## 2015-12-18 NOTE — Telephone Encounter (Signed)
Meds ordered this encounter  Medications  . zolpidem (AMBIEN) 10 MG tablet    Sig: TAKE 1 TABLET BY MOUTH AT BEDTIME    Dispense:  30 tablet    Refill:  0    Not to exceed 5 additional fills before 05/17/2016

## 2015-12-19 NOTE — Telephone Encounter (Signed)
Called in.

## 2015-12-27 ENCOUNTER — Telehealth: Payer: Self-pay | Admitting: Physician Assistant

## 2015-12-27 ENCOUNTER — Other Ambulatory Visit: Payer: Self-pay | Admitting: Physician Assistant

## 2015-12-27 NOTE — Telephone Encounter (Signed)
Patient request a refill of Lisinopril 5 MG and Celexa 40 MG. [facility] on [address].

## 2015-12-28 ENCOUNTER — Other Ambulatory Visit: Payer: Self-pay | Admitting: Physician Assistant

## 2015-12-28 MED ORDER — CITALOPRAM HYDROBROMIDE 40 MG PO TABS: ORAL_TABLET | ORAL | Status: DC

## 2015-12-28 MED ORDER — LISINOPRIL 5 MG PO TABS: ORAL_TABLET | ORAL | Status: DC

## 2015-12-28 NOTE — Telephone Encounter (Signed)
Last Rx, appt with Chelle on 5/23. 30 day supply sent.

## 2016-01-01 ENCOUNTER — Encounter: Payer: Self-pay | Admitting: Physician Assistant

## 2016-01-02 ENCOUNTER — Encounter: Payer: Self-pay | Admitting: Physician Assistant

## 2016-01-02 ENCOUNTER — Ambulatory Visit (INDEPENDENT_AMBULATORY_CARE_PROVIDER_SITE_OTHER): Payer: BLUE CROSS/BLUE SHIELD | Admitting: Physician Assistant

## 2016-01-02 VITALS — BP 113/75 | HR 85 | Temp 98.7°F | Resp 16 | Ht 64.75 in | Wt 163.6 lb

## 2016-01-02 DIAGNOSIS — E785 Hyperlipidemia, unspecified: Secondary | ICD-10-CM

## 2016-01-02 DIAGNOSIS — F418 Other specified anxiety disorders: Secondary | ICD-10-CM

## 2016-01-02 DIAGNOSIS — I10 Essential (primary) hypertension: Secondary | ICD-10-CM

## 2016-01-02 DIAGNOSIS — F32A Depression, unspecified: Secondary | ICD-10-CM | POA: Insufficient documentation

## 2016-01-02 DIAGNOSIS — Z23 Encounter for immunization: Secondary | ICD-10-CM

## 2016-01-02 DIAGNOSIS — G4700 Insomnia, unspecified: Secondary | ICD-10-CM

## 2016-01-02 DIAGNOSIS — F329 Major depressive disorder, single episode, unspecified: Secondary | ICD-10-CM | POA: Insufficient documentation

## 2016-01-02 DIAGNOSIS — F419 Anxiety disorder, unspecified: Secondary | ICD-10-CM | POA: Insufficient documentation

## 2016-01-02 MED ORDER — METOPROLOL SUCCINATE ER 100 MG PO TB24: ORAL_TABLET | ORAL | Status: DC

## 2016-01-02 MED ORDER — ZOLPIDEM TARTRATE 10 MG PO TABS: 10.0000 mg | ORAL_TABLET | Freq: Every day | ORAL | Status: DC

## 2016-01-02 MED ORDER — CITALOPRAM HYDROBROMIDE 40 MG PO TABS: ORAL_TABLET | ORAL | Status: DC

## 2016-01-02 MED ORDER — ZOSTER VACCINE LIVE 19400 UNT/0.65ML ~~LOC~~ SUSR: 0.6500 mL | Freq: Once | SUBCUTANEOUS | Status: DC

## 2016-01-02 MED ORDER — VERAPAMIL HCL ER 180 MG PO CP24: 180.0000 mg | ORAL_CAPSULE | Freq: Every day | ORAL | Status: DC

## 2016-01-02 MED ORDER — ALPRAZOLAM 0.25 MG PO TABS: 0.2500 mg | ORAL_TABLET | Freq: Two times a day (BID) | ORAL | Status: DC | PRN

## 2016-01-02 NOTE — Progress Notes (Signed)
Patient ID: MICHOL CAN, female    DOB: Sep 04, 1950, 65 y.o.   MRN: [id number]  PCP: Wynne Dust  Subjective:   Chief Complaint  Patient presents with  . Medication Refill    per patient ALL RXs    HPI Presents for medication refills.  Overall, she is doing well. Hot flashes from Femara. Will take it for a total of 5 years. Otherwise, she is tolerating her medications well without adverse effects.    Review of Systems As above.    Patient Active Problem List   Diagnosis Date Noted  . Abnormal breast finding 09/27/2015  . Breast cancer of upper-outer quadrant of left female breast ([place]) 09/09/2014  . HTN (hypertension) 12/24/2012  . Migraine 12/24/2012  . GERD (gastroesophageal reflux disease) 12/24/2012  . Hearing loss in left ear 12/24/2012  . 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 Insomnia 12/24/2012  . Diverticulosis 12/24/2012  . Personal history of colonic polyps 12/24/2012  . Hyperlipidemia 12/24/2012  . BCC (basal cell carcinoma), arm 12/24/2012     Prior to Admission medications   Medication Sig Start Date End Date Taking? Authorizing Provider  ALPRAZolam Duanne Moron) 0.25 MG tablet TAKE 1 TABLET TWICE A DAY AS NEEDED 11/19/15  Yes Kaleen Rochette, PA-C  citalopram (CELEXA) 40 MG tablet TAKE 1 TABLET BY MOUTH EVERY DAY **MAKE APPT FOR MORE REFILLS** 12/28/15  Yes Pratyush Ammon, PA-C  HYDROcodone-acetaminophen (NORCO/VICODIN) 5-325 MG tablet  09/06/15  Yes Historical Provider, MD  letrozole (FEMARA) 2.5 MG tablet Take 1 tablet (2.5 mg total) by mouth daily. 06/19/15  Yes Nicholas Lose, MD  lisinopril (PRINIVIL,ZESTRIL) 5 MG tablet TAKE 1 TABLET (5 MG TOTAL) BY MOUTH DAILY "NO MORE REFILLS WITHOUT OFFICE VISIT" 12/28/15  Yes Messiah Rovira, PA-C  metoprolol succinate (TOPROL-XL) 100 MG 24 hr tablet TAKE 1 TABLET (100 MG TOTAL) BY MOUTH DAILY.  TAKE WITH OR IMMEDIATELY FOLLOWING A MEAL   "NO MORE REFILLS WITHOUT OFFICE VISIT" 08/01/15  Yes Calise Dunckel, PA-C    verapamil (VERELAN PM) 180 MG 24 hr capsule TAKE 1 CAPSULE (180 MG TOTAL) BY MOUTH 2 (TWO) TIMES DAILY "NO MORE REFILLS WITHOUT OFFICE VISIT" 10/29/15  Yes Lathaniel Legate, PA-C  zolpidem (AMBIEN) 10 MG tablet TAKE 1 TABLET BY MOUTH AT BEDTIME 12/18/15  Yes Aarthi Uyeno, PA-C  anastrozole (ARIMIDEX) 1 MG tablet Take 1 tablet (1 mg total) by mouth daily. Patient not taking: Reported on 01/02/2016 03/02/15   Nicholas Lose, MD  docusate sodium (COLACE) 100 MG capsule Take 1 capsule (100 mg total) by mouth daily. Patient not taking: Reported on 01/02/2016 05/19/15   [name], MD  fluorometholone (FML) 0.1 % ophthalmic suspension INSTILL 1 DROP 4 TIMES A DAY FOR 4 DAYS THEN TWICE A DAY FOR 1 WEEK 12/29/15   Historical Provider, MD  lansoprazole (PREVACID) 15 MG capsule Take 30 mg by mouth daily at 12 noon. Reported on 01/02/2016    Historical Provider, MD  methocarbamol (ROBAXIN) 500 MG tablet Take 1 tablet (500 mg total) by mouth 4 (four) times daily. Patient not taking: Reported on 01/02/2016 05/19/15   [name], MD  phenylephrine (SUDAFED PE) 10 MG TABS tablet Take 10 mg by mouth as needed.    Historical Provider, MD     Allergies  Allergen Reactions  . Codeine Nausea And Vomiting  . Darvocet [Propoxyphene N-Acetaminophen] Other (See Comments)    Pruritis, rapid heart rate  . Tramadol Other (See Comments)    Chest tightness  . Gold-Containing Drug Products Swelling  .  Sulfa Antibiotics Rash       Objective:  Physical Exam  Constitutional: She is oriented to person, place, and time. She appears well-developed and well-nourished. She is active and cooperative. No distress.  BP 113/75 mmHg  Pulse 85  Temp(Src) 98.7 F (37.1 C) (Oral)  Resp 16  Ht 5' 4.75" (1.645 m)  Wt 163 lb 9.6 oz (74.208 kg)  BMI 27.42 kg/m2  SpO2 94%  HENT:  Head: Normocephalic and atraumatic.  Right Ear: Hearing normal.  Left Ear: Hearing normal.  Eyes: Conjunctivae are normal. No scleral icterus.  Neck:  Normal range of motion. Neck supple. No thyromegaly present.  Cardiovascular: Normal rate, regular rhythm and normal heart sounds.   Pulses:      Radial pulses are 2+ on the right side, and 2+ on the left side.  Pulmonary/Chest: Effort normal and breath sounds normal.  Lymphadenopathy:       Head (right side): No tonsillar, no preauricular, no posterior auricular and no occipital adenopathy present.       Head (left side): No tonsillar, no preauricular, no posterior auricular and no occipital adenopathy present.    She has no cervical adenopathy.       Right: No supraclavicular adenopathy present.       Left: No supraclavicular adenopathy present.  Neurological: She is alert and oriented to person, place, and time. No sensory deficit.  LEFT-sided facial palsy  Skin: Skin is warm, dry and intact. No rash noted. No cyanosis or erythema. Nails show no clubbing.  Psychiatric: She has a normal mood and affect. Her speech is normal and behavior is normal. Judgment and thought content normal. Cognition and memory are normal.           Assessment & Plan:   1. Essential hypertension controlled - verapamil (VERELAN PM) 180 MG 24 hr capsule; Take 1 capsule (180 mg total) by mouth at bedtime.  Dispense: 180 capsule; Refill: 3 - metoprolol succinate (TOPROL-XL) 100 MG 24 hr tablet; TAKE 1 TABLET (100 MG TOTAL) BY MOUTH DAILY.  TAKE WITH OR IMMEDIATELY FOLLOWING A MEAL.  Dispense: 90 tablet; Refill: 3  2. Insomnia, Anxiety and depression Controlled - citalopram (CELEXA) 40 MG tablet; TAKE 1 TABLET BY MOUTH EVERY DAY  Dispense: 90 tablet; Refill: 3 - ALPRAZolam (XANAX) 0.25 MG tablet; Take 1 tablet (0.25 mg total) by mouth 2 (two) times daily as needed.  Dispense: 60 tablet; Refill: 0 - zolpidem (AMBIEN) 10 MG tablet; Take 1 tablet (10 mg total) by mouth at bedtime.  Dispense: 30 tablet; Refill: 0  3. Need for shingles vaccine - Zoster Vaccine Live, PF, (ZOSTAVAX) 60454 UNT/0.65ML injection;  Inject 19,400 Units into the skin once.  Dispense: 1 each; Refill: 0   Fara Chute, PA-C Physician Assistant-Certified [facility]

## 2016-01-02 NOTE — Patient Instructions (Signed)
     IF you received an x-ray today, you will receive an invoice from [facility]. Please contact [facility] at [phone] with questions or concerns regarding your invoice.   IF you received labwork today, you will receive an invoice from [facility]. Please contact [facility] at [phone] with questions or concerns regarding your invoice.   Our billing staff will not be able to assist you with questions regarding bills from these companies.  You will be contacted with the lab results as soon as they are available. The fastest way to get your results is to activate your My Chart account. Instructions are located on the last page of this paperwork. If you have not heard from us regarding the results in 2 weeks, please contact this office.      

## 2016-01-16 ENCOUNTER — Telehealth: Payer: Self-pay | Admitting: *Deleted

## 2016-01-16 NOTE — Telephone Encounter (Signed)
Pt called relaying that Letrozole is causing pain to her joints to the point that it was hard for her to get out of her chair. Relate she has stopped taking her medication. Informed pt that we typically have pt d/c medication when they have these s/s and start on a new AI. Pt to see Dr. Lindi Adie on 6/14. Confirmed appt. Discussed with pt the need to discuss her symptoms with Dr. Lindi Adie. Dr. Lindi Adie notified of symptoms and d/c of letrozole. Encourage pt to call with further needs or questions. Received verbal understanding.

## 2016-01-24 ENCOUNTER — Ambulatory Visit (HOSPITAL_BASED_OUTPATIENT_CLINIC_OR_DEPARTMENT_OTHER): Payer: BLUE CROSS/BLUE SHIELD | Admitting: Hematology and Oncology

## 2016-01-24 ENCOUNTER — Encounter: Payer: Self-pay | Admitting: Hematology and Oncology

## 2016-01-24 VITALS — BP 142/91 | HR 88 | Temp 98.1°F | Resp 18 | Ht 64.75 in | Wt 164.4 lb

## 2016-01-24 DIAGNOSIS — M791 Myalgia: Secondary | ICD-10-CM

## 2016-01-24 DIAGNOSIS — C50412 Malignant neoplasm of upper-outer quadrant of left female breast: Secondary | ICD-10-CM

## 2016-01-24 NOTE — Assessment & Plan Note (Addendum)
Left Mastectomy 09/23/14: IDC grade 2 with extracellular mucin 6.2 cm, 4 LN Neg, ER 97%, PR 98%, Her 2 Neg ratio 1.28, Ki 67 47% T3N0 Stage 2B  Treatment plan: adjuvant chemotherapy withDose dense Adriamycin and Cytoxan 4 followed by Abraxane weekly 12 started 10/20/14 completed 03/02/15 Declined XRT Started Anastrozole 03/27/15  Anastrozole Toxicities: 1. Hair loss 2. Fatigue 3. Joint aches and pains  Breast Cancer Surveillance: 1. Breast exam 01/24/2016: Normal 2. Mammogram 10/05/2015 No abnormalities. Postsurgical changes. Breast Density Category C. I recommended that she get 3-D mammograms for surveillance. Discussed the differences between different breast density categories.    I encouraged her to take anastrozole at bedtime and see if her symptoms are any better. Patient stays very busy with dancing, helping her mother and other friends.  RTC in 6 months

## 2016-01-24 NOTE — Progress Notes (Signed)
Patient Care Team: [name], PA-C as PCP - General (Family Medicine) Fanny Skates, MD as Consulting Physician (General Surgery) Nicholas Lose, MD as Consulting Physician (Hematology and Oncology) Arloa Koh, MD as Consulting Physician (Radiation Oncology) Mauro Kaufmann, RN as Registered Nurse Rockwell Germany, RN as Registered Nurse Holley Bouche, NP as Nurse Practitioner (Nurse Practitioner) Sylvan Cheese, NP as Nurse Practitioner (Hematology and Oncology) [name], MD as Consulting Physician (Plastic Surgery)  DIAGNOSIS: Breast cancer of upper-outer quadrant of left female breast [facility])   Staging form: Breast, AJCC 7th Edition The uterus is of    Clinical stage from 09/14/2014: Stage IIB (T3, N0, M0) - Unsigned     Pathologic stage from 09/26/2014: Stage IIB (T3, N0, cM0) - Signed by Seward Grater, MD on 10/04/2014       Staging comments: Staged on final mastectomy by Dr. Avis Epley.    SUMMARY OF ONCOLOGIC HISTORY:   Breast cancer of upper-outer quadrant of left female breast ([place])   08/23/2014 Mammogram Left breast: possible mass warranting further imaging   08/30/2014 Breast US Left breast: solid heterogeneous mass centered at 3:00, 2 cm from the nipple and extending into the retroareolar region. Mass is greater than 5 cm.   09/06/2014 Initial Biopsy Left breast 3:00 biopsy: Invasive ductal carcinoma with abundant extracellular mucin, ER+ (97%), PR+ (98%), Ki-67 47%, HER-2 negative (ratio 1.28)   09/12/2014 Breast MRI Left breast: 6.5 x 5.3 x 5.1 cm irregular enhancing mass with 2 adjacent 8 mm satellite masses, no lymphadenopathy   09/14/2014 Clinical Stage Stage IIB: T3 M0   09/23/2014 Definitive Surgery Left Mastectomy/[facility]): IDC grade 2 with extracellular mucin 6.2 cm, 4 LN Neg, ER 97%, PR 98%, Her 2 Neg ratio 1.28, Ki 67 47%   09/23/2014 Pathologic Stage Stage IIB: T3 N0   10/20/2014 - 03/02/2015 Chemotherapy Adjuvant chemotherapy with dose dense Adriamycin  and Cytoxan 4 followed by Abraxane weekly 12, refused radiation    Radiation Therapy Pt declined   03/27/2015 - 01/24/2016 Anti-estrogen oral therapy Anastrozole 1 mg daily switched to letrozole Nov 2016, Stopped due to myalgias   05/18/2015 Surgery Left delayed breast reconstruction with tissue expander. Capsulectomy and ablation of chronic seroma cavity [facility]ares)   07/12/2015 Survivorship Survivorship care plan completed and mailed to patient in lieu of in person visit    CHIEF COMPLIANT: Significant side effects from antiestrogen therapy  INTERVAL HISTORY: Amy [name] is a 65 year old with above-mentioned history of left breast cancer treated with mastectomy followed by adjuvant chemotherapy. She declined radiation and was on antiestrogen therapy initially anastrozole later changed to letrozole. Unfortunately she is having lots of musculoskeletal aches and pains to the point that she is in tears. She does not want to continue antiestrogen therapy anymore. She is now unable to do any activities at home or outside.  REVIEW OF SYSTEMS:   Constitutional: Denies fevers, chills or abnormal weight loss Eyes: Denies blurriness of vision Ears, nose, mouth, throat, and face: Denies mucositis or sore throat Respiratory: Denies cough, dyspnea or wheezes Cardiovascular: Denies palpitation, chest discomfort Gastrointestinal:  Denies nausea, heartburn or change in bowel habits Skin: Denies abnormal skin rashes Lymphatics: Denies new lymphadenopathy or easy bruising Neurological:Denies numbness, tingling or new weaknesses Behavioral/Psych: Mood is stable, no new changes  Extremities: No lower extremity edema Breast:  denies any pain or lumps or nodules in either breasts All other systems were reviewed with the patient and are negative.  I have reviewed the past  medical history, past surgical history, social history and family history with the patient and they are unchanged from previous  note.  ALLERGIES:  is allergic to codeine; darvocet; tramadol; gold-containing drug products; and sulfa antibiotics.  MEDICATIONS:  Current Outpatient Prescriptions  Medication Sig Dispense Refill  . ALPRAZolam (XANAX) 0.25 MG tablet Take 1 tablet (0.25 mg total) by mouth 2 (two) times daily as needed. 60 tablet 0  . citalopram (CELEXA) 40 MG tablet TAKE 1 TABLET BY MOUTH EVERY DAY 90 tablet 3  . fluorometholone (FML) 0.1 % ophthalmic suspension INSTILL 1 DROP 4 TIMES A DAY FOR 4 DAYS THEN TWICE A DAY FOR 1 WEEK  2  . HYDROcodone-acetaminophen (NORCO/VICODIN) 5-325 MG tablet     . lansoprazole (PREVACID) 15 MG capsule Take 30 mg by mouth daily at 12 noon. Reported on 01/02/2016    . letrozole (FEMARA) 2.5 MG tablet Take 1 tablet (2.5 mg total) by mouth daily. 90 tablet 3  . metoprolol succinate (TOPROL-XL) 100 MG 24 hr tablet TAKE 1 TABLET (100 MG TOTAL) BY MOUTH DAILY.  TAKE WITH OR IMMEDIATELY FOLLOWING A MEAL. 90 tablet 3  . phenylephrine (SUDAFED PE) 10 MG TABS tablet Take 10 mg by mouth as needed.    . verapamil (VERELAN PM) 180 MG 24 hr capsule Take 1 capsule (180 mg total) by mouth at bedtime. 180 capsule 3  . zolpidem (AMBIEN) 10 MG tablet Take 1 tablet (10 mg total) by mouth at bedtime. 30 tablet 0  . Zoster Vaccine Live, PF, (ZOSTAVAX) 46568 UNT/0.65ML injection Inject 19,400 Units into the skin once. 1 each 0   No current facility-administered medications for this visit.    PHYSICAL EXAMINATION: ECOG PERFORMANCE STATUS: 1 - Symptomatic but completely ambulatory  Filed Vitals:   01/24/16 1358  BP: 142/91  Pulse: 88  Temp: 98.1 F (36.7 C)  Resp: 18   Filed Weights   01/24/16 1358  Weight: 164 lb 6.4 oz (74.571 kg)    GENERAL:alert, no distress and comfortable SKIN: skin color, texture, turgor are normal, no rashes or significant lesions EYES: normal, Conjunctiva are pink and non-injected, sclera clear OROPHARYNX:no exudate, no erythema and lips, buccal mucosa, and  tongue normal  NECK: supple, thyroid normal size, non-tender, without nodularity LYMPH:  no palpable lymphadenopathy in the cervical, axillary or inguinal LUNGS: clear to auscultation and percussion with normal breathing effort HEART: regular rate & rhythm and no murmurs and no lower extremity edema ABDOMEN:abdomen soft, non-tender and normal bowel sounds MUSCULOSKELETAL:no cyanosis of digits and no clubbing  NEURO: alert & oriented x 3 with fluent speech, no focal motor/sensory deficits EXTREMITIES: No lower extremity edema  LABORATORY DATA:  I have reviewed the data as listed   Chemistry      Component Value Date/Time   NA 135 05/09/2015 1030   NA 139 03/02/2015 0852   K 4.0 05/09/2015 1030   K 4.1 03/02/2015 0852   CL 100* 05/09/2015 1030   CO2 27 05/09/2015 1030   CO2 27 03/02/2015 0852   BUN 8 05/09/2015 1030   BUN 4.6* 03/02/2015 0852   CREATININE 0.76 05/09/2015 1030   CREATININE 0.7 03/02/2015 0852   CREATININE 0.56 08/16/2014 1208      Component Value Date/Time   CALCIUM 9.3 05/09/2015 1030   CALCIUM 8.9 03/02/2015 0852   ALKPHOS 49 03/02/2015 0852   ALKPHOS 34* 09/20/2014 1553   AST 17 03/02/2015 0852   AST 18 09/20/2014 1553   ALT 14 03/02/2015 0852  ALT 11 09/20/2014 1553   BILITOT 0.30 03/02/2015 0852   BILITOT 0.2* 09/20/2014 1553       Lab Results  Component Value Date   WBC 6.6 05/09/2015   HGB 12.3 05/09/2015   HCT 37.9 05/09/2015   MCV 84.4 05/09/2015   PLT 408* 05/09/2015   NEUTROABS 1.7 03/02/2015     ASSESSMENT & PLAN:  Breast cancer of upper-outer quadrant of left female breast Left Mastectomy 09/23/14: IDC grade 2 with extracellular mucin 6.2 cm, 4 LN Neg, ER 97%, PR 98%, Her 2 Neg ratio 1.28, Ki 67 47% T3N0 Stage 2B  Treatment plan: adjuvant chemotherapy withDose dense Adriamycin and Cytoxan 4 followed by Abraxane weekly 12 started 10/20/14 completed 03/02/15 Declined XRT Started Anastrozole 03/27/15 Switch to letrozole November 2016,  discontinued letrozole 01/24/2016 due to musculoskeletal aches and pains  Breast Cancer Surveillance: 1. Breast exam 01/24/2016: Normal 2. Mammogram 10/05/2015 No abnormalities. Postsurgical changes. Breast Density Category C. I recommended that she get 3-D mammograms for surveillance. Discussed the differences between different breast density categories.    Patient stays very busy with dancing, helping her mother and other friends.  RTC in 6 months    No orders of the defined types were placed in this encounter.   The patient has a good understanding of the overall plan. she agrees with it. she will call with any problems that may develop before the next visit here.   Rulon Eisenmenger, MD 01/24/2016

## 2016-01-29 ENCOUNTER — Other Ambulatory Visit: Payer: Self-pay | Admitting: Physician Assistant

## 2016-01-30 NOTE — Telephone Encounter (Signed)
Rx printed at 104. Will bring to 102 after clinic.  Meds ordered this encounter  Medications  . ALPRAZolam (XANAX) 0.25 MG tablet    Sig: TAKE 1 TABLET BY MOUTH 2 TIMES DAILY AS NEEDED    Dispense:  60 tablet    Refill:  0    Not to exceed 5 additional fills before 06/30/2016

## 2016-01-31 NOTE — Telephone Encounter (Signed)
Called in.

## 2016-02-09 ENCOUNTER — Telehealth: Payer: Self-pay

## 2016-02-09 DIAGNOSIS — I10 Essential (primary) hypertension: Secondary | ICD-10-CM

## 2016-02-09 MED ORDER — ZOLPIDEM TARTRATE 10 MG PO TABS: 10.0000 mg | ORAL_TABLET | Freq: Every day | ORAL | Status: DC

## 2016-02-09 NOTE — Telephone Encounter (Signed)
Meds ordered this encounter  Medications  . zolpidem (AMBIEN) 10 MG tablet    Sig: Take 1 tablet (10 mg total) by mouth at bedtime.    Dispense:  30 tablet    Refill:  0    Not to exceed 5 additional fills before 05/17/2016    Order Specific Question:  Supervising Provider    Answer:  Tami Lin P [id number]

## 2016-02-09 NOTE — Telephone Encounter (Signed)
fax request for Zolpidem tartrate 10mg  routed to [facility]

## 2016-02-12 NOTE — Telephone Encounter (Signed)
Called in.

## 2016-03-06 ENCOUNTER — Other Ambulatory Visit: Payer: Self-pay

## 2016-03-06 MED ORDER — ALPRAZOLAM 0.25 MG PO TABS: ORAL_TABLET | ORAL | 0 refills | Status: DC

## 2016-03-06 NOTE — Telephone Encounter (Signed)
Pharm reqs RF of alprazolam. Pended. 

## 2016-03-06 NOTE — Telephone Encounter (Signed)
Faxed

## 2016-03-06 NOTE — Telephone Encounter (Signed)
Meds ordered this encounter  Medications  . ALPRAZolam (XANAX) 0.25 MG tablet    Sig: TAKE 1 TABLET BY MOUTH 2 TIMES DAILY AS NEEDED    Dispense:  60 tablet    Refill:  0    Not to exceed 5 additional fills before 06/30/2016

## 2016-03-08 ENCOUNTER — Other Ambulatory Visit: Payer: Self-pay | Admitting: Physician Assistant

## 2016-03-08 DIAGNOSIS — I10 Essential (primary) hypertension: Secondary | ICD-10-CM

## 2016-03-08 NOTE — Telephone Encounter (Signed)
faxed

## 2016-03-08 NOTE — Telephone Encounter (Signed)
Meds ordered this encounter  Medications  . zolpidem (AMBIEN) 10 MG tablet    Sig: TAKE 1 TAB AT BEDTIME    Dispense:  30 tablet    Refill:  0    Not to exceed 4 additional fills before 08/08/2016

## 2016-03-09 ENCOUNTER — Telehealth: Payer: Self-pay

## 2016-03-09 ENCOUNTER — Other Ambulatory Visit: Payer: Self-pay | Admitting: Physician Assistant

## 2016-03-09 NOTE — Telephone Encounter (Signed)
Patient states that the pharmacy does not have the refill for xanax. The previous message states it was faxed on 7/26. Please resend!  [facility] in [place]

## 2016-03-11 ENCOUNTER — Other Ambulatory Visit: Payer: Self-pay | Admitting: Physician Assistant

## 2016-03-13 NOTE — Telephone Encounter (Signed)
Called pharmacy, Rx was picked up on 7/31

## 2016-04-05 ENCOUNTER — Other Ambulatory Visit: Payer: Self-pay | Admitting: Physician Assistant

## 2016-04-05 DIAGNOSIS — I10 Essential (primary) hypertension: Secondary | ICD-10-CM

## 2016-04-07 NOTE — Telephone Encounter (Signed)
Meds ordered this encounter  Medications  . zolpidem (AMBIEN) 10 MG tablet    Sig: TAKE 1 TABLET AT BEDTIME    Dispense:  30 tablet    Refill:  0    Not to exceed 5 additional fills before 09/04/2016

## 2016-04-08 NOTE — Telephone Encounter (Signed)
Faxed

## 2016-04-12 ENCOUNTER — Other Ambulatory Visit: Payer: Self-pay | Admitting: Physician Assistant

## 2016-04-12 NOTE — Telephone Encounter (Signed)
Last 30 day RF 7/26. Last OV 5/23.

## 2016-04-16 NOTE — Telephone Encounter (Signed)
Meds ordered this encounter  Medications  . ALPRAZolam (XANAX) 0.25 MG tablet    Sig: TAKE 1 TABLET BY MOUTH TWICE A DAY AS NEEDED    Dispense:  60 tablet    Refill:  0    Not to exceed 5 additional fills before 09/07/2016

## 2016-04-17 DIAGNOSIS — J0140 Acute pansinusitis, unspecified: Secondary | ICD-10-CM | POA: Diagnosis not present

## 2016-04-17 DIAGNOSIS — Z23 Encounter for immunization: Secondary | ICD-10-CM | POA: Diagnosis not present

## 2016-04-17 NOTE — Telephone Encounter (Signed)
Faxed

## 2016-05-06 ENCOUNTER — Other Ambulatory Visit: Payer: Self-pay | Admitting: Physician Assistant

## 2016-05-06 DIAGNOSIS — I10 Essential (primary) hypertension: Secondary | ICD-10-CM

## 2016-05-07 NOTE — Telephone Encounter (Signed)
Wants chelle to call her regarding her medication

## 2016-05-08 NOTE — Telephone Encounter (Signed)
Meds ordered this encounter  Medications  . zolpidem (AMBIEN) 10 MG tablet    Sig: TAKE 1 TABLET AT BEDTIME    Dispense:  30 tablet    Refill:  0    Not to exceed 5 additional fills before 10/05/2016    Plan follow-up late November.

## 2016-05-08 NOTE — Telephone Encounter (Signed)
Notified pt RF done and need for f/up. Transferred her to scheduling for appt and she is having her husband p/up. Rx was put in p/up drawer.

## 2016-05-08 NOTE — Telephone Encounter (Signed)
Pt checking on status of this med refill request.

## 2016-05-08 NOTE — Telephone Encounter (Signed)
LMOM to CB if she needed to discuss problem with medication or has question about it. I don't see that pt left any mention of anything other than needing the refill when she called to check status, but wanted to check with her regarding wanting Chelle to call. Advised pt that otherwise, Chelle is in the office today and we will call her when it has been sent.

## 2016-06-04 ENCOUNTER — Other Ambulatory Visit: Payer: Self-pay | Admitting: Physician Assistant

## 2016-06-04 DIAGNOSIS — I10 Essential (primary) hypertension: Secondary | ICD-10-CM

## 2016-06-04 NOTE — Telephone Encounter (Signed)
Chelle, is ok for her to have her alprazolam 0.25mg  prescription? Her last refill was on 04/16/16. Her next appt. With you is on 06/15/16.   Thank you!

## 2016-06-04 NOTE — Telephone Encounter (Signed)
Rx printed at 104. Will bring to 102 after clinic.  Meds ordered this encounter  Medications  . zolpidem (AMBIEN) 10 MG tablet    Sig: TAKE 1 TABLET AT BEDTIME    Dispense:  30 tablet    Refill:  0    Not to exceed 5 additional fills before 11/04/2016

## 2016-06-04 NOTE — Telephone Encounter (Signed)
Last OV for insomnia 5/23, last RF 9/27.

## 2016-06-04 NOTE — Telephone Encounter (Signed)
Faxed

## 2016-06-04 NOTE — Telephone Encounter (Signed)
Rx printed at 104. Will bring to 102 after clinic.  Meds ordered this encounter  Medications  . ALPRAZolam (XANAX) 0.25 MG tablet    Sig: TAKE 1 TABLET TWICE A DAY AS NEEDED    Dispense:  60 tablet    Refill:  0    Not to exceed 5 additional fills before 10/14/2016

## 2016-06-05 NOTE — Telephone Encounter (Signed)
Faxed

## 2016-06-25 ENCOUNTER — Encounter: Payer: Self-pay | Admitting: Physician Assistant

## 2016-06-25 ENCOUNTER — Ambulatory Visit (INDEPENDENT_AMBULATORY_CARE_PROVIDER_SITE_OTHER): Payer: Medicare Other | Admitting: Physician Assistant

## 2016-06-25 VITALS — BP 138/84 | HR 81 | Temp 98.4°F | Resp 18 | Ht 64.5 in | Wt 158.4 lb

## 2016-06-25 DIAGNOSIS — G4700 Insomnia, unspecified: Secondary | ICD-10-CM

## 2016-06-25 DIAGNOSIS — F419 Anxiety disorder, unspecified: Secondary | ICD-10-CM

## 2016-06-25 DIAGNOSIS — F329 Major depressive disorder, single episode, unspecified: Secondary | ICD-10-CM

## 2016-06-25 DIAGNOSIS — E785 Hyperlipidemia, unspecified: Secondary | ICD-10-CM | POA: Diagnosis not present

## 2016-06-25 DIAGNOSIS — Z23 Encounter for immunization: Secondary | ICD-10-CM | POA: Diagnosis not present

## 2016-06-25 DIAGNOSIS — F418 Other specified anxiety disorders: Secondary | ICD-10-CM

## 2016-06-25 DIAGNOSIS — I10 Essential (primary) hypertension: Secondary | ICD-10-CM | POA: Diagnosis not present

## 2016-06-25 DIAGNOSIS — M7711 Lateral epicondylitis, right elbow: Secondary | ICD-10-CM | POA: Diagnosis not present

## 2016-06-25 DIAGNOSIS — F32A Depression, unspecified: Secondary | ICD-10-CM

## 2016-06-25 MED ORDER — ZOLPIDEM TARTRATE 10 MG PO TABS: 10.0000 mg | ORAL_TABLET | Freq: Every day | ORAL | 0 refills | Status: DC

## 2016-06-25 MED ORDER — ZOSTER VACCINE LIVE 19400 UNT/0.65ML ~~LOC~~ SUSR: 0.6500 mL | Freq: Once | SUBCUTANEOUS | 0 refills | Status: AC

## 2016-06-25 MED ORDER — ALPRAZOLAM 0.25 MG PO TABS: 0.2500 mg | ORAL_TABLET | Freq: Two times a day (BID) | ORAL | 0 refills | Status: DC | PRN

## 2016-06-25 NOTE — Progress Notes (Signed)
Patient ID: Amy [name], female    DOB: 1950-09-23, 65 y.o.   MRN: [id number]  PCP: [name], PA-C  Chief Complaint  Patient presents with  . Medication Refill  . Elbow Pain    right elbow. Hit elbow on gate latch 2 weeks ago.     Subjective:    HPI Presents for evaluation of HTN, lipids, insomnia. In addition, she is having RIGHT elbow pain x 2 weeks, after the wind blew the metal gate closed and the latch hit the outer elbow.  Overall, she feels well.  Uses prn hydrocodone due to pain in her feet due to letrozole effect and need for injections.  Has completed her breast reconstruction surgeries. Will not have any additional procedures. Is excited to purchase her first new bra since her diagnosis 08/2014.  She is RIGHT hand dominant. Is able to do all her ADLs, fully flex and extend the elbow, but notes that pronation/supination hurts and grasping/lifting items hurts.   Review of Systems As above. No CP, SOB, HA, dizziness. No nausea, vomiting or diarrhea.    Patient Active Problem List   Diagnosis Date Noted  . Anxiety and depression 01/02/2016  . Breast cancer of upper-outer quadrant of left female breast ([place]) 09/09/2014  . HTN (hypertension) 12/24/2012  . Migraine 12/24/2012  . GERD (gastroesophageal reflux disease) 12/24/2012  . Hearing loss in left ear 12/24/2012  . 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 Insomnia 12/24/2012  . Diverticulosis 12/24/2012  . Personal history of colonic polyps 12/24/2012  . Hyperlipidemia 12/24/2012  . BCC (basal cell carcinoma), arm 12/24/2012     Prior to Admission medications   Medication Sig Start Date End Date Taking? Authorizing Provider  ALPRAZolam Duanne Moron) 0.25 MG tablet TAKE 1 TABLET TWICE A DAY AS NEEDED 06/04/16  Yes Arnetia Bronk, PA-C  citalopram (CELEXA) 40 MG tablet TAKE 1 TABLET BY MOUTH EVERY DAY 01/02/16  Yes Ferris Tally, PA-C  HYDROcodone-acetaminophen (NORCO/VICODIN) 5-325 MG tablet  09/06/15  Yes  Historical Provider, MD  lansoprazole (PREVACID) 15 MG capsule Take 30 mg by mouth daily at 12 noon. Reported on 01/02/2016   Yes Historical Provider, MD  metoprolol succinate (TOPROL-XL) 100 MG 24 hr tablet TAKE 1 TABLET (100 MG TOTAL) BY MOUTH DAILY.  TAKE WITH OR IMMEDIATELY FOLLOWING A MEAL. 01/02/16  Yes Kathee Tumlin, PA-C  phenylephrine (SUDAFED PE) 10 MG TABS tablet Take 10 mg by mouth as needed.   Yes Historical Provider, MD  verapamil (VERELAN PM) 180 MG 24 hr capsule Take 1 capsule (180 mg total) by mouth at bedtime. 01/02/16  Yes Raybon Conard, PA-C  zolpidem (AMBIEN) 10 MG tablet TAKE 1 TABLET AT BEDTIME 06/04/16  Yes Wilmary Levit, PA-C  Zoster Vaccine Live, PF, (ZOSTAVAX) 60454 UNT/0.65ML injection Inject 19,400 Units into the skin once. 01/02/16  Yes Elleana Stillson, PA-C  fluorometholone (FML) 0.1 % ophthalmic suspension INSTILL 1 DROP 4 TIMES A DAY FOR 4 DAYS THEN TWICE A DAY FOR 1 WEEK 12/29/15   Historical Provider, MD     Allergies  Allergen Reactions  . Codeine Nausea And Vomiting  . Darvocet [Propoxyphene N-Acetaminophen] Other (See Comments)    Pruritis, rapid heart rate  . Tramadol Other (See Comments)    Chest tightness  . Gold-Containing Drug Products Swelling  . Sulfa Antibiotics Rash       Objective:  Physical Exam  Constitutional: She is oriented to person, place, and time. She appears well-developed and well-nourished. She is active and cooperative. No distress.  BP 138/84   Pulse 81   Temp 98.4 F (36.9 C) (Oral)   Resp 18   Ht 5' 4.5" (1.638 m)   Wt 158 lb 6.4 oz (71.8 kg)   SpO2 95%   BMI 26.77 kg/m   HENT:  Head: Normocephalic and atraumatic.  Right Ear: Hearing normal.  Left Ear: Hearing normal.  Eyes: Conjunctivae are normal. No scleral icterus.  Neck: Normal range of motion. Neck supple. No thyromegaly present.  Cardiovascular: Normal rate, regular rhythm and normal heart sounds.   Pulses:      Radial pulses are 2+ on the right side, and  2+ on the left side.  Pulmonary/Chest: Effort normal and breath sounds normal.  Musculoskeletal:       Right shoulder: Normal.       Right elbow: She exhibits swelling (trace swelling of the lateral epicondyle). She exhibits normal range of motion, no effusion, no deformity and no laceration. Tenderness found. Lateral epicondyle tenderness noted. No radial head, no medial epicondyle and no olecranon process tenderness noted.       Left elbow: Normal.       Right wrist: Normal.       Right upper arm: Normal.       Right forearm: Normal.       Right hand: Normal.  Lymphadenopathy:       Head (right side): No tonsillar, no preauricular, no posterior auricular and no occipital adenopathy present.       Head (left side): No tonsillar, no preauricular, no posterior auricular and no occipital adenopathy present.    She has no cervical adenopathy.       Right: No supraclavicular adenopathy present.       Left: No supraclavicular adenopathy present.  Neurological: She is alert and oriented to person, place, and time. No sensory deficit.  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 No rash noted. No cyanosis or erythema. Nails show no clubbing.  Psychiatric: She has a normal mood and affect. Her speech is normal and behavior is normal.           Assessment & Plan:   1. Essential hypertension Controlled.  2. Hyperlipidemia, unspecified hyperlipidemia type Not fasting today. Controlled when last checked 12 months ago. Defer to next visit.  3. Insomnia, unspecified type, AND Anxiety and Depression Stable. Reminded her to minimize hydrocodone use with these medications to minimize risk of accidental overdose. - zolpidem (AMBIEN) 10 MG tablet; Take 1 tablet (10 mg total) by mouth at bedtime.  Dispense: 30 tablet; Refill: 0 - ALPRAZolam (XANAX) 0.25 MG tablet; Take 1 tablet (0.25 mg total) by mouth 2 (two) times daily as needed.  Dispense: 60 tablet; Refill: 0  4.  Need for shingles vaccine Her pharmacy wouldn't administer it last time she tried. Perhaps due to her breast cancer treatment? Now that she's completed treatment, she'll try again. - Zoster Vaccine Live, PF, (ZOSTAVAX) [id number] UNT/0.65ML injection; Inject 19,400 Units into the skin once.  Dispense: 1 each; Refill: 0  5. Lateral epicondylitis of right elbow Rest. OTC elbow strap. Ice. If persists, RTC for radiographs.   Return in about 6 months (around 12/23/2016) for re-evaluation and fasting labs.    Fara Chute, PA-C Physician Assistant-Certified [facility]

## 2016-06-25 NOTE — Patient Instructions (Addendum)
Consider a tennis elbow strap. Ice massage can also help.    IF you received an x-ray today, you will receive an invoice from [facility]. Please contact [facility] at [phone] with questions or concerns regarding your invoice.   IF you received labwork today, you will receive an invoice from [employer]. Please contact [facility] at [phone] with questions or concerns regarding your invoice.   Our billing staff will not be able to assist you with questions regarding bills from these companies.  You will be contacted with the lab results as soon as they are available. The fastest way to get your results is to activate your My Chart account. Instructions are located on the last page of this paperwork. If you have not heard from [place] regarding the results in 2 weeks, please contact this office.

## 2016-07-09 ENCOUNTER — Ambulatory Visit: Payer: BLUE CROSS/BLUE SHIELD | Admitting: Physician Assistant

## 2016-07-23 ENCOUNTER — Telehealth (INDEPENDENT_AMBULATORY_CARE_PROVIDER_SITE_OTHER): Payer: Self-pay | Admitting: Physical Medicine and Rehabilitation

## 2016-07-23 ENCOUNTER — Ambulatory Visit (INDEPENDENT_AMBULATORY_CARE_PROVIDER_SITE_OTHER): Payer: BLUE CROSS/BLUE SHIELD | Admitting: Physical Medicine and Rehabilitation

## 2016-07-23 ENCOUNTER — Encounter (INDEPENDENT_AMBULATORY_CARE_PROVIDER_SITE_OTHER): Payer: Self-pay | Admitting: Physical Medicine and Rehabilitation

## 2016-07-23 VITALS — BP 153/103 | HR 72

## 2016-07-23 DIAGNOSIS — M545 Low back pain: Secondary | ICD-10-CM

## 2016-07-23 DIAGNOSIS — M961 Postlaminectomy syndrome, not elsewhere classified: Secondary | ICD-10-CM | POA: Diagnosis not present

## 2016-07-23 DIAGNOSIS — G8929 Other chronic pain: Secondary | ICD-10-CM

## 2016-07-23 DIAGNOSIS — M7062 Trochanteric bursitis, left hip: Secondary | ICD-10-CM

## 2016-07-23 DIAGNOSIS — M7061 Trochanteric bursitis, right hip: Secondary | ICD-10-CM

## 2016-07-23 DIAGNOSIS — M4125 Other idiopathic scoliosis, thoracolumbar region: Secondary | ICD-10-CM | POA: Diagnosis not present

## 2016-07-23 DIAGNOSIS — G894 Chronic pain syndrome: Secondary | ICD-10-CM | POA: Diagnosis not present

## 2016-07-23 NOTE — Telephone Encounter (Signed)
Active [employer] coverage and no precert required

## 2016-07-23 NOTE — Progress Notes (Addendum)
SHARECE SMALLS - 65 y.o. female MRN [id number]  Date of birth: [birth year]  Office Visit Note: Visit Date: 07/23/2016 PCP: [name], PA-C Referred by: [name], PA-C  Subjective: Chief Complaint  Patient presents with  . Lower Back - Pain   HPI: [name] is a 65 year old female that I last saw on 2015 who comes in today with chronic worsening low back and bilateral hip pain with some buttock pain as well. Her case is very complicated. 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 She has had pain really ever since that time particularly over the graft site in the iliac crest on the right. She also has had chronic low back pain and at times radicular pain. We have had multiple images from her the last MRI that we have shows no real nerve compression or stenosis. She is fused down to L4-5. She has done well with facet blocks in the past but did not really respond to radiofrequency ablation. She has had a complicated course with brain meningioma which was resected but did cause weakness on the left side. She recovered from that and since I've last seen her has undergone mastectomy for breast cancer and chemotherapy. She's really had a rough go of it and very complicated medical history. She used to do well with Lidoderm patches over the graft site but her insurance did not cover those amazingly even though we appealed her case at the time. She has not had any radicular symptoms. She's had no new trauma. She says the pain is very severe she can't lay on either side. She has to try to lay on her sides because of the meniscectomy and surgery that she had. She really is not sleeping well. Most of her pain is with standing and walking. She does okay sitting. She rates her pain as a 9 out of 10 at times. She reports her pain as a 5 with just standing normally. She gets some pain worse at night. She has some referral pattern over the right hip and leg  on the posterior lateral side. She still gets a little bit of tingling but not true dysesthesia. This is on the right. No focal weakness. No bowel or bladder dysfunction. She has had physical therapy in the past. She has had chronic narcotic medicines in the past and really did not do well with those. She reports intolerances an allergy to tramadol codeine and Darvocet.    Review of Systems  Constitutional: Negative for chills, fever, malaise/fatigue and weight loss.  HENT: Negative for hearing loss and sinus pain.   Eyes: Negative for blurred vision, double vision and photophobia.  Respiratory: Negative for cough and shortness of breath.   Cardiovascular: Negative for chest pain, palpitations and leg swelling.  Gastrointestinal: Negative for abdominal pain, nausea and vomiting.  Genitourinary: Negative for flank pain.  Musculoskeletal: Positive for back pain and joint pain. Negative for myalgias.  Skin: Negative for itching and rash.  Neurological: Positive for tingling. Negative for tremors, focal weakness and weakness.  Endo/Heme/Allergies: Negative.   Psychiatric/Behavioral: Negative for depression.  All other systems reviewed and are negative.  Otherwise per HPI.  Assessment & Plan: Visit Diagnoses:  1. Chronic bilateral low back pain without sciatica   2. Other idiopathic scoliosis, thoracolumbar region   3. Post laminectomy syndrome   4. Chronic pain syndrome   5. Trochanteric bursitis, left hip   6. Trochanteric bursitis, right hip     Plan:  Findings:  Chronic pain syndrome and postlaminectomy syndrome and a very complicated case with prior brain tumor resection and now status post breast cancer treatment with chemotherapy and meniscectomy. She's had longstem fusion for scoliosis. She is having chronic axial low back pain worse with extension rotation consistent with facet related pain. She really had put off any injections for quite a while because of her illness. She did great  with bilateral facet joint blocks at L5-S1 the past. I do want to schedule her for that. Nothing else is really ever helped her. She is getting pain over the iliac crest site. I think some of this is myofascial and not just really over the graft site but in her mind it is over the graft site. I think a trigger point injection today would be beneficial for that. His helped her in the past. She also has greater trochanteric bursitis bilaterally worse on the right. Due to the severity of her symptoms and her complicated case with ahead and injected that today as well just from a diagnostic standpoint. Again she might do well with over-the-counter Lidoderm patches over the graft site. She has not been trying those. She has been on different medications to the chemotherapy. She has a fairly bad history with opioid medications and really is not indicated in this case. I spent more than 25 minutes speaking face-to-face with the patient with 50% of the time in counseling.    Meds & Orders: No orders of the defined types were placed in this encounter.   Orders Placed This Encounter  Procedures  . Large Joint Injection/Arthrocentesis  . Trigger Point Injection    Follow-up: Return for scheduled bilateral L5-S1 Facet injection.   Procedures: Greater trochanteric bursa injection Date/Time: 07/23/2016 9:16 AM Performed by: Magnus Sinning Authorized by: Magnus Sinning   Consent Given by:  Patient Site marked: the procedure site was marked   Timeout: prior to procedure the correct patient, procedure, and site was verified   Indications:  Pain Location:  Hip Site:  R greater trochanter Prep: patient was prepped and draped in usual sterile fashion   Needle Size:  22 G Needle Length:  3.5 inches Approach:  Lateral Ultrasound Guidance: No   Fluoroscopic Guidance: No   Arthrogram: No   Medications:  40 mg triamcinolone acetonide 40 MG/ML; 4 mL lidocaine 2 % Aspiration Attempted: No   Patient  tolerance:  Patient tolerated the procedure well with no immediate complications Trigger Point Inj Gluteus medius Date/Time: 07/23/2016 9:17 AM Performed by: Magnus Sinning Authorized by: Magnus Sinning   Consent Given by:  Patient Site marked: the procedure site was marked   Timeout: prior to procedure the correct patient, procedure, and site was verified   Indications:  Muscle spasm and pain Total # of Trigger Points:  1 Location: back   Needle Size:  25 G Approach:  Dorsal Medications #1:  40 mg triamcinolone acetonide 40 MG/ML; 3 mL lidocaine 1 % Patient tolerance:  Patient tolerated the procedure well with no immediate complications    No notes on file   Clinical History: No specialty comments available.  She reports that she quit smoking about 21 years ago. Her smoking use included Cigarettes. She has a 5.00 pack-year smoking history. She has never used smokeless tobacco. No results for input(s): HGBA1C, LABURIC in the last 8760 hours.  Objective:  VS:  HT:    WT:   BMI:     BP:(!) 153/103  HR:72bpm  TEMP: ( )  RESP:  Physical Exam  Constitutional: She is oriented to person, place, and time. She appears well-developed and well-nourished.  Eyes: Conjunctivae are normal.  Left Eye ptosis  Neck: Normal range of motion. Neck supple. No tracheal deviation present.  Cardiovascular: Normal rate and intact distal pulses.   Pulmonary/Chest: Effort normal.  Abdominal: Soft. There is no tenderness. There is no guarding.  Musculoskeletal:  Spine observation shows fairly straight spine with long well-healed surgical scar scar over right iliac crest which was bone donor site. She has pain over the site and almost as allodynia which is a chronic problem for her. There is no swelling or redness or induration. She does have a focal trigger point in this area to that does reproduce her pain. She has pain over both greater trochanters which is fairly exquisite. She has no pain with hip  rotation. She has good distal strength without any deficits and no clonus. His pain with extension of the lumbar spine and rotation.  Neurological: She is alert and oriented to person, place, and time. A cranial nerve deficit is present. She exhibits normal muscle tone. Coordination normal.  Skin: Skin is warm and dry. No rash noted. No erythema.  Psychiatric: She has a normal mood and affect. Her behavior is normal.  Nursing note and vitals reviewed.   Ortho Exam Imaging: No results found.  Past Medical/Family/Surgical/Social History: Medications & Allergies reviewed per EMR Patient Active Problem List   Diagnosis Date Noted  . Anxiety and depression 01/02/2016  . Breast cancer of upper-outer quadrant of left female breast ([place]) 09/09/2014  . HTN (hypertension) 12/24/2012  . Migraine 12/24/2012  . GERD (gastroesophageal reflux disease) 12/24/2012  . Hearing loss in left ear 12/24/2012  . 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 Insomnia 12/24/2012  . Diverticulosis 12/24/2012  . Personal history of colonic polyps 12/24/2012  . Hyperlipidemia 12/24/2012  . BCC (basal cell carcinoma), arm 12/24/2012   Past Medical History:  Diagnosis Date  . Allergy   . 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 Anxiety    "sometimes" (09/23/2014)  . Cancer of left breast ([place]) 08/2014   left upper outer  . Cerebral meningioma ([place]) 09/2012   auditory; s/p excision  . Chronic lower back pain   . Chronic pain in left foot   . Deafness in left ear   . Facial droop    "left side; from brain tumor OR"  . GERD (gastroesophageal reflux disease)   . History of chemotherapy    last treatment 03/02/2015  . History of hiatal hernia   . Hypertension   . Migraine    "before brain tumor was removed" (09/23/2014)  . PONV (postoperative nausea and vomiting)    "severe"  . Stress incontinence    Family History  Problem Relation Age of Onset  . Adopted: Yes  . Hypertension Father    Past  Surgical History:  Procedure Laterality Date  . BACK SURGERY    . BRAIN SURGERY  2014   cerebellopontine angle tumor with craniotomy   . BREAST BIOPSY Left 08/2014  . BREAST RECONSTRUCTION Left 05/18/2015  . BREAST RECONSTRUCTION WITH PLACEMENT OF TISSUE EXPANDER AND FLEX HD (ACELLULAR HYDRATED DERMIS) Left 05/18/2015   Procedure: LEFT BREAST RECONSTRUCTION WITH PLACEMENT OF TISSUE EXPANDER AND ACELLULAR HYDRATED DERMIS MATRIX;  Surgeon: [name], MD;  Location: [facility];  Service: Plastics;  Laterality: Left;  . COLONOSCOPY    . FOOT FRACTURE SURGERY Left 1990's  .  FOOT SURGERY Left    "one the muscle; trying to roll foot inward after failed recovery S/P fracture"  . FRACTURE SURGERY    . JOINT REPLACEMENT    . KNEE ARTHROSCOPY Left   . MASTECTOMY COMPLETE / SIMPLE W/ SENTINEL NODE BIOPSY Left 09/23/2014  . MULTIPLE TOOTH EXTRACTIONS    . PARTIAL KNEE ARTHROPLASTY Left 2000's    Dr. Sharol Given  . PATELLAR TENDON REPAIR  05/27/2012   Procedure: PATELLA TENDON REPAIR;  Surgeon: Newt Minion, MD;  Location: [facility];  Service: Orthopedics;  Laterality: Right;  Right Patella Tendon  Reconstruction with Semi Tendonosum  . PORT-A-CATH REMOVAL Right 03/16/2015   Procedure: REMOVAL PORT-A-CATH;  Surgeon: Fanny Skates, MD;  Location: [facility];  Service: General;  Laterality: Right;  . PORTACATH PLACEMENT Right 09/23/2014  . PORTACATH PLACEMENT Right 09/23/2014   Procedure: INSERTION PORT-A-CATH RIGHT SUBCLAVIAN;  Surgeon: Fanny Skates, MD;  Location: [facility];  Service: General;  Laterality: Right;  . POSTERIOR LUMBAR FUSION  02/1988   L2 reconstruction; fusion  . SIMPLE MASTECTOMY WITH AXILLARY SENTINEL NODE BIOPSY Left 09/23/2014   Procedure: LEFT TOTAL MASTECTOMY WITH LEFT AXILLARY SENTINEL LYMPH NODE BIOPSY;  Surgeon: Fanny Skates, MD;  Location: [facility];  Service: General;  Laterality: Left;  . TOTAL ABDOMINAL HYSTERECTOMY  12/1976  . TOTAL KNEE ARTHROPLASTY Right 05/24/2009    Dr. Sharol Given  . UPPER  GASTROINTESTINAL ENDOSCOPY    . WISDOM TOOTH EXTRACTION     Social History   Occupational History  . disability     [employer]   Social History Main Topics  . Smoking status: Former Smoker    Packs/day: 0.25    Years: 20.00    Types: Cigarettes    Quit date: 05/22/1995  . Smokeless tobacco: Never Used  . Alcohol use No  . Drug use: No  . Sexual activity: Yes    Partners: Male    Birth control/ protection: None     Comment: menarche age 57, P 0 ,HRT x 42 yrs

## 2016-07-24 MED ORDER — LIDOCAINE HCL 1 % IJ SOLN
3.0000 mL | INTRAMUSCULAR | Status: AC | PRN
  Administered 2016-07-23: 3 mL

## 2016-07-24 MED ORDER — TRIAMCINOLONE ACETONIDE 40 MG/ML IJ SUSP
40.0000 mg | INTRAMUSCULAR | Status: AC | PRN
  Administered 2016-07-23: 40 mg via INTRAMUSCULAR

## 2016-07-24 MED ORDER — TRIAMCINOLONE ACETONIDE 40 MG/ML IJ SUSP
40.0000 mg | INTRAMUSCULAR | Status: AC | PRN
  Administered 2016-07-23: 40 mg via INTRA_ARTICULAR

## 2016-07-24 MED ORDER — LIDOCAINE HCL 2 % IJ SOLN
4.0000 mL | INTRAMUSCULAR | Status: AC | PRN
  Administered 2016-07-23: 4 mL

## 2016-07-29 ENCOUNTER — Other Ambulatory Visit: Payer: Self-pay | Admitting: Physician Assistant

## 2016-07-29 DIAGNOSIS — G4700 Insomnia, unspecified: Secondary | ICD-10-CM

## 2016-07-30 ENCOUNTER — Ambulatory Visit (INDEPENDENT_AMBULATORY_CARE_PROVIDER_SITE_OTHER): Payer: BLUE CROSS/BLUE SHIELD | Admitting: Physical Medicine and Rehabilitation

## 2016-07-30 ENCOUNTER — Encounter (INDEPENDENT_AMBULATORY_CARE_PROVIDER_SITE_OTHER): Payer: Self-pay | Admitting: Physical Medicine and Rehabilitation

## 2016-07-30 VITALS — BP 141/89 | HR 96 | Temp 98.3°F

## 2016-07-30 DIAGNOSIS — M47816 Spondylosis without myelopathy or radiculopathy, lumbar region: Secondary | ICD-10-CM | POA: Diagnosis not present

## 2016-07-30 DIAGNOSIS — G894 Chronic pain syndrome: Secondary | ICD-10-CM

## 2016-07-30 DIAGNOSIS — M961 Postlaminectomy syndrome, not elsewhere classified: Secondary | ICD-10-CM

## 2016-07-30 MED ORDER — LIDOCAINE HCL (PF) 1 % IJ SOLN: 0.3300 mL | Freq: Once | INTRAMUSCULAR | Status: DC

## 2016-07-30 MED ORDER — METHYLPREDNISOLONE ACETATE 80 MG/ML IJ SUSP
80.0000 mg | Freq: Once | INTRAMUSCULAR | Status: AC
  Administered 2016-07-30: 80 mg

## 2016-07-30 NOTE — Progress Notes (Signed)
Amy [name] - 65 y.o. female MRN [id number]  Date of birth: Mar 22, 1951  Office Visit Note: Visit Date: 07/30/2016 PCP: [name], PA-C Referred by: [name], PA-C  Subjective: Chief Complaint  Patient presents with  . Lower Back - Pain   HPI: Amy [name] is a 65 year old female that recently saw evaluation and management. She is having chronic worsening low back pain below her fusion. Please see our prior evaluation and management note for further details and justification.    ROS Otherwise per HPI.  Assessment & Plan: Visit Diagnoses:  1. Spondylosis without myelopathy or radiculopathy, lumbar region   2. Post laminectomy syndrome   3. Chronic pain syndrome     Plan: Findings:  Planned bilateral L5-S1 facet joint blocks.    Meds & Orders:  Meds ordered this encounter  Medications  . lidocaine (PF) (XYLOCAINE) 1 % injection 0.3 mL  . methylPREDNISolone acetate (DEPO-MEDROL) injection 80 mg    Orders Placed This Encounter  Procedures  . Nerve Block    Follow-up: Return if symptoms worsen or fail to improve.   Procedures: No procedures performed  Lumbar Facet Joint Intra-Articular Injection(s) with Fluoroscopic Guidance  Patient: Amy [name]      Date of Birth: Mar 30, 1951 MRN: [id number] PCP: [name], PA-C      Visit Date: 07/30/2016   Universal Protocol:    Date/Time: 12/19/172:18 PM  Consent Given By: the patient  Position: PRONE   Additional Comments: Vital signs were monitored before and after the procedure. Patient was prepped and draped in the usual sterile fashion. The correct patient, procedure, and site was verified.   Injection Procedure Details:  Procedure Site One Meds Administered:  Meds ordered this encounter  Medications  . lidocaine (PF) (XYLOCAINE) 1 % injection 0.3 mL  . methylPREDNISolone acetate (DEPO-MEDROL) injection 80 mg     Laterality: Bilateral  Location/Site:  L5-S1  Needle  size: 22 guage  Needle type: Spinal  Needle Placement: Articular  Findings:  -Contrast Used: 1 mL iohexol 180 mg iodine/mL   -Comments: Excellent flow of contrast producing a partial arthrogram.  Procedure Details: The fluoroscope beam is vertically oriented in AP, and the inferior recess is visualized beneath the lower pole of the inferior apophyseal process, which represents the target point for needle insertion. When direct visualization is difficult the target point is located at the medial projection of the vertebral pedicle. The region overlying each aforementioned target is locally anesthetized with a 1 to 2 ml. volume of 1% Lidocaine without Epinephrine.   The spinal needle was inserted into each of the above mentioned facet joints using biplanar fluoroscopic guidance. A 0.25 to 0.5 ml. volume of Isovue-250 was injected and a partial facet joint arthrogram was obtained. A single spot film was obtained of the resulting arthrogram.    One to 1.25 ml of the steroid/anesthetic solution was then injected into each of the facet joints noted above.   Additional Comments:  The patient tolerated the procedure well Dressing: Band-Aid    Post-procedure details: Patient was observed during the procedure. Post-procedure instructions were reviewed.  Patient left the clinic in stable condition.       Clinical History: No specialty comments available.  She reports that she quit smoking about 21 years ago. Her smoking use included Cigarettes. She has a 5.00 pack-year smoking history. She has never used smokeless tobacco. No results for input(s): HGBA1C, LABURIC in the last 8760 hours.  Objective:  VS:  HT:  WT:   BMI:     BP:(!) 141/89  HR:96bpm  TEMP:98.3 F (36.8 C)(Oral)  RESP:(!) 70 % Physical Exam  Musculoskeletal:  The patient ambulates without aid. She does have concordant pain with extension rotation of the lumbar spine. She has some allodynia across the right lower back  and upper buttock.    Ortho Exam Imaging: No results found.  Past Medical/Family/Surgical/Social History: Medications & Allergies reviewed per EMR Patient Active Problem List   Diagnosis Date Noted  . Spondylosis without myelopathy or radiculopathy, lumbar region 07/30/2016  . Anxiety and depression 01/02/2016  . Breast cancer of upper-outer quadrant of left female breast ([place]) 09/09/2014  . HTN (hypertension) 12/24/2012  . Migraine 12/24/2012  . GERD (gastroesophageal reflux disease) 12/24/2012  . Hearing loss in left ear 12/24/2012  . 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 Insomnia 12/24/2012  . Diverticulosis 12/24/2012  . Personal history of colonic polyps 12/24/2012  . Hyperlipidemia 12/24/2012  . BCC (basal cell carcinoma), arm 12/24/2012   Past Medical History:  Diagnosis Date  . Allergy   . 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 Anxiety    "sometimes" (09/23/2014)  . Cancer of left breast ([place]) 08/2014   left upper outer  . Cerebral meningioma ([place]) 09/2012   auditory; s/p excision  . Chronic lower back pain   . Chronic pain in left foot   . Deafness in left ear   . Facial droop    "left side; from brain tumor OR"  . GERD (gastroesophageal reflux disease)   . History of chemotherapy    last treatment 03/02/2015  . History of hiatal hernia   . Hypertension   . Migraine    "before brain tumor was removed" (09/23/2014)  . PONV (postoperative nausea and vomiting)    "severe"  . Stress incontinence    Family History  Problem Relation Age of Onset  . Adopted: Yes  . Hypertension Father    Past Surgical History:  Procedure Laterality Date  . BACK SURGERY    . BRAIN SURGERY  2014   cerebellopontine angle tumor with craniotomy   . BREAST BIOPSY Left 08/2014  . BREAST RECONSTRUCTION Left 05/18/2015  . BREAST RECONSTRUCTION WITH PLACEMENT OF TISSUE EXPANDER AND FLEX HD (ACELLULAR HYDRATED DERMIS) Left 05/18/2015   Procedure: LEFT BREAST RECONSTRUCTION  WITH PLACEMENT OF TISSUE EXPANDER AND ACELLULAR HYDRATED DERMIS MATRIX;  Surgeon: [name], MD;  Location: [facility];  Service: Plastics;  Laterality: Left;  . COLONOSCOPY    . FOOT FRACTURE SURGERY Left 1990's  . FOOT SURGERY Left    "one the muscle; trying to roll foot inward after failed recovery S/P fracture"  . FRACTURE SURGERY    . JOINT REPLACEMENT    . KNEE ARTHROSCOPY Left   . MASTECTOMY COMPLETE / SIMPLE W/ SENTINEL NODE BIOPSY Left 09/23/2014  . MULTIPLE TOOTH EXTRACTIONS    . PARTIAL KNEE ARTHROPLASTY Left 2000's    Dr. Sharol Given  . PATELLAR TENDON REPAIR  05/27/2012   Procedure: PATELLA TENDON REPAIR;  Surgeon: Newt Minion, MD;  Location: [facility];  Service: Orthopedics;  Laterality: Right;  Right Patella Tendon  Reconstruction with Semi Tendonosum  . PORT-A-CATH REMOVAL Right 03/16/2015   Procedure: REMOVAL PORT-A-CATH;  Surgeon: Fanny Skates, MD;  Location: [facility];  Service: General;  Laterality: Right;  . PORTACATH PLACEMENT Right 09/23/2014  . PORTACATH PLACEMENT Right 09/23/2014   Procedure: INSERTION PORT-A-CATH RIGHT SUBCLAVIAN;  Surgeon: Fanny Skates, MD;  [facility];  Service: General;  Laterality: Right;  . POSTERIOR LUMBAR FUSION  02/1988   L2 reconstruction; fusion  . SIMPLE MASTECTOMY WITH AXILLARY SENTINEL NODE BIOPSY Left 09/23/2014   Procedure: LEFT TOTAL MASTECTOMY WITH LEFT AXILLARY SENTINEL LYMPH NODE BIOPSY;  Surgeon: Fanny Skates, MD;  Location: [facility];  Service: General;  Laterality: Left;  . TOTAL ABDOMINAL HYSTERECTOMY  12/1976  . TOTAL KNEE ARTHROPLASTY Right 05/24/2009    Dr. Sharol Given  . UPPER GASTROINTESTINAL ENDOSCOPY    . WISDOM TOOTH EXTRACTION     Social History   Occupational History  . disability     [employer]   Social History Main Topics  . Smoking status: Former Smoker    Packs/day: 0.25    Years: 20.00    Types: Cigarettes    Quit date: 05/22/1995  . Smokeless tobacco: Never Used  . Alcohol use No  . Drug use: No  . Sexual activity: Yes      Partners: Male    Birth control/ protection: None     Comment: menarche age 53, P 0 ,HRT x 42 yrs

## 2016-07-30 NOTE — Patient Instructions (Signed)

## 2016-07-30 NOTE — Procedures (Signed)
Lumbar Facet Joint Intra-Articular Injection(s) with Fluoroscopic Guidance  Patient: Amy [name]      Date of Birth: Jan 22, 1951 MRN: [id number] PCP: [name], PA-C      Visit Date: 07/30/2016   Universal Protocol:    Date/Time: 12/19/172:18 PM  Consent Given By: the patient  Position: PRONE   Additional Comments: Vital signs were monitored before and after the procedure. Patient was prepped and draped in the usual sterile fashion. The correct patient, procedure, and site was verified.   Injection Procedure Details:  Procedure Site One Meds Administered:  Meds ordered this encounter  Medications  . lidocaine (PF) (XYLOCAINE) 1 % injection 0.3 mL  . methylPREDNISolone acetate (DEPO-MEDROL) injection 80 mg     Laterality: Bilateral  Location/Site:  L5-S1  Needle size: 22 guage  Needle type: Spinal  Needle Placement: Articular  Findings:  -Contrast Used: 1 mL iohexol 180 mg iodine/mL   -Comments: Excellent flow of contrast producing a partial arthrogram.  Procedure Details: The fluoroscope beam is vertically oriented in AP, and the inferior recess is visualized beneath the lower pole of the inferior apophyseal process, which represents the target point for needle insertion. When direct visualization is difficult the target point is located at the medial projection of the vertebral pedicle. The region overlying each aforementioned target is locally anesthetized with a 1 to 2 ml. volume of 1% Lidocaine without Epinephrine.   The spinal needle was inserted into each of the above mentioned facet joints using biplanar fluoroscopic guidance. A 0.25 to 0.5 ml. volume of Isovue-250 was injected and a partial facet joint arthrogram was obtained. A single spot film was obtained of the resulting arthrogram.    One to 1.25 ml of the steroid/anesthetic solution was then injected into each of the facet joints noted above.   Additional Comments:  The patient tolerated the  procedure well Dressing: Band-Aid    Post-procedure details: Patient was observed during the procedure. Post-procedure instructions were reviewed.  Patient left the clinic in stable condition.

## 2016-08-01 NOTE — Telephone Encounter (Signed)
Lm rx up front for pick up  ([facility])

## 2016-08-01 NOTE — Telephone Encounter (Signed)
Meds ordered this encounter  Medications  . zolpidem (AMBIEN) 10 MG tablet    Sig: TAKE 1 TABLET BY MOUTH AT BEDTIME    Dispense:  30 tablet    Refill:  0    Not to exceed 5 additional fills before 12/25/2016

## 2016-08-14 ENCOUNTER — Other Ambulatory Visit: Payer: Self-pay | Admitting: Physician Assistant

## 2016-08-14 DIAGNOSIS — F329 Major depressive disorder, single episode, unspecified: Secondary | ICD-10-CM

## 2016-08-14 DIAGNOSIS — F419 Anxiety disorder, unspecified: Secondary | ICD-10-CM

## 2016-08-14 DIAGNOSIS — Z9882 Breast implant status: Secondary | ICD-10-CM | POA: Diagnosis not present

## 2016-08-14 DIAGNOSIS — Z853 Personal history of malignant neoplasm of breast: Secondary | ICD-10-CM | POA: Diagnosis not present

## 2016-08-16 NOTE — Telephone Encounter (Signed)
Meds ordered this encounter  Medications  . ALPRAZolam (XANAX) 0.25 MG tablet    Sig: TAKE 1 TABLET BY MOUTH 2 TIMES A DAY AS NEEDED    Dispense:  60 tablet    Refill:  0    Not to exceed 5 additional fills before 12/25/2016

## 2016-08-17 NOTE — Telephone Encounter (Signed)
Faxed to [facility]

## 2016-08-20 ENCOUNTER — Other Ambulatory Visit: Payer: Self-pay | Admitting: Nurse Practitioner

## 2016-08-23 ENCOUNTER — Other Ambulatory Visit: Payer: Self-pay | Admitting: Physician Assistant

## 2016-08-23 DIAGNOSIS — Z853 Personal history of malignant neoplasm of breast: Secondary | ICD-10-CM

## 2016-08-30 ENCOUNTER — Other Ambulatory Visit: Payer: Self-pay | Admitting: Physician Assistant

## 2016-08-30 DIAGNOSIS — G5761 Lesion of plantar nerve, right lower limb: Secondary | ICD-10-CM | POA: Diagnosis not present

## 2016-08-30 DIAGNOSIS — G4700 Insomnia, unspecified: Secondary | ICD-10-CM

## 2016-08-31 ENCOUNTER — Other Ambulatory Visit: Payer: Self-pay

## 2016-08-31 DIAGNOSIS — G4700 Insomnia, unspecified: Secondary | ICD-10-CM

## 2016-08-31 MED ORDER — ZOLPIDEM TARTRATE 10 MG PO TABS: 10.0000 mg | ORAL_TABLET | Freq: Every day | ORAL | 0 refills | Status: DC

## 2016-08-31 NOTE — Telephone Encounter (Signed)
Patient's husband is calling to check on patient's Ambien prescription.  The pharmacy sent in a request yesterday.  I advised him that refills typically take 2-3 business days and that we do not have someone working on refills on saturdays. He states that she is out of her prescription.  Please advise  [phone]

## 2016-08-31 NOTE — Telephone Encounter (Signed)
Meds ordered this encounter  Medications  . zolpidem (AMBIEN) 10 MG tablet    Sig: Take 1 tablet (10 mg total) by mouth at bedtime.    Dispense:  30 tablet    Refill:  0    Not to exceed 5 additional fills before 12/25/2016

## 2016-08-31 NOTE — Telephone Encounter (Signed)
08/01/16 last refill 06/2016 last ov

## 2016-09-02 NOTE — Telephone Encounter (Signed)
Called to pharmacy 

## 2016-09-09 ENCOUNTER — Encounter: Payer: Self-pay | Admitting: Student

## 2016-09-16 ENCOUNTER — Telehealth (INDEPENDENT_AMBULATORY_CARE_PROVIDER_SITE_OTHER): Payer: Self-pay | Admitting: Physical Medicine and Rehabilitation

## 2016-09-16 NOTE — Telephone Encounter (Signed)
Did they really help if so then ok, if not much help we would eval at same time

## 2016-09-17 NOTE — Telephone Encounter (Signed)
Patient said both injections really did help. Scheduled for 09/24/16 at 0815.

## 2016-09-18 DIAGNOSIS — G5762 Lesion of plantar nerve, left lower limb: Secondary | ICD-10-CM | POA: Diagnosis not present

## 2016-09-24 ENCOUNTER — Ambulatory Visit (INDEPENDENT_AMBULATORY_CARE_PROVIDER_SITE_OTHER): Payer: Self-pay

## 2016-09-24 ENCOUNTER — Ambulatory Visit (INDEPENDENT_AMBULATORY_CARE_PROVIDER_SITE_OTHER): Payer: BLUE CROSS/BLUE SHIELD | Admitting: Physical Medicine and Rehabilitation

## 2016-09-24 ENCOUNTER — Encounter (INDEPENDENT_AMBULATORY_CARE_PROVIDER_SITE_OTHER): Payer: Self-pay | Admitting: Physical Medicine and Rehabilitation

## 2016-09-24 VITALS — BP 154/103 | HR 79

## 2016-09-24 DIAGNOSIS — M25551 Pain in right hip: Secondary | ICD-10-CM

## 2016-09-24 DIAGNOSIS — G894 Chronic pain syndrome: Secondary | ICD-10-CM

## 2016-09-24 DIAGNOSIS — M609 Myositis, unspecified: Secondary | ICD-10-CM | POA: Insufficient documentation

## 2016-09-24 DIAGNOSIS — M961 Postlaminectomy syndrome, not elsewhere classified: Secondary | ICD-10-CM | POA: Insufficient documentation

## 2016-09-24 DIAGNOSIS — M7061 Trochanteric bursitis, right hip: Secondary | ICD-10-CM | POA: Diagnosis not present

## 2016-09-24 NOTE — Progress Notes (Signed)
Amy [name] - 66 y.o. female MRN [id number]  Date of birth: 07/30/1951  Office Visit Note: Visit Date: 09/24/2016 PCP: [name], PA-C Referred by: [name], PA-C  Subjective: Chief Complaint  Patient presents with  . Right Hip - Pain   HPI: Amy [name] is a 66 year old female with chronic pain history and really just unfortunate medical problems with longstem fusion with continued pain over the graft site in the right pelvis. She continues to have chronic low back pain due to facet mediated pain below the fusion. She also most recently he has had right greater trochanteric bursitis which is been pretty severe. She comes in today after being seen in November with bursitis of the right hip which we completed injections that time with good relief up until just recently. She finds it hard to lay on that side. On exam today is pretty exquisitely tender to the touch right over the greater trochanter. She does try to stay active and tries to stretch. She also is reporting pain over the graft site once again. I think this is really more of a trigger point situation over the years trigger point injections along this area has been fairly beneficial. She is to use Lidoderm patches obtains those over-the-counter now with some relief. Her low back is actually doing much better after facet joint block performed in December. She really states her low back is doing quite well. She has no tingling numbness or paresthesia. No focal weakness no new trauma.    Review of Systems  Constitutional: Negative for chills, fever, malaise/fatigue and weight loss.  HENT: Negative for hearing loss and sinus pain.   Eyes: Negative for blurred vision, double vision and photophobia.  Respiratory: Negative for cough and shortness of breath.   Cardiovascular: Negative for chest pain, palpitations and leg swelling.  Gastrointestinal: Negative for abdominal pain, nausea and vomiting.  Genitourinary:  Negative for flank pain.  Musculoskeletal: Positive for back pain and joint pain. Negative for myalgias.  Skin: Negative for itching and rash.  Neurological: Negative for tremors, focal weakness and weakness.  Endo/Heme/Allergies: Negative.   Psychiatric/Behavioral: Negative for depression.  All other systems reviewed and are negative.  Otherwise per HPI.  Assessment & Plan: Visit Diagnoses:  1. Pain in right hip   2. Trochanteric bursitis, right hip   3. Myofascitis   4. Post laminectomy syndrome   5. Chronic pain syndrome     Plan: Findings:  Repeat right greater trochanteric bursa injection under fluoroscopic guidance to the body habitus. Hopefully this will give her more long-term relief. Exam is consistent with bursitis. We will entertain the idea of an L5 radicular pain as well but nothing past the knee and no paresthesias. We will go ahead and complete a trigger point injection over the gluteus DES and piriformis area which overlies the graft site area from her lumbar fusion. This is on the right.    Meds & Orders: No orders of the defined types were placed in this encounter.   Orders Placed This Encounter  Procedures  . Large Joint Injection/Arthrocentesis  . Trigger Point Injection  . XR C-ARM NO REPORT    Follow-up: Return if symptoms worsen or fail to improve, after 2 weeks.   Procedures: Greater Trochanter Bursa injection with fluoroscopic guidance Date/Time: 09/24/2016 8:40 AM Performed by: Magnus Sinning Authorized by: Magnus Sinning   Consent Given by:  Parent Site marked: the procedure site was marked   Timeout: prior to procedure the correct patient,  procedure, and site was verified   Indications:  Pain and diagnostic evaluation Location:  Hip Site:  R greater trochanter Prep: patient was prepped and draped in usual sterile fashion   Needle Size:  22 G Needle Length:  3.5 inches Approach:  Lateral Ultrasound Guidance: No   Fluoroscopic Guidance: Yes    Arthrogram: No   Medications:  3 mL bupivacaine 0.5 %; 80 mg triamcinolone acetonide 40 MG/ML Aspiration Attempted: No   Patient tolerance:  Patient tolerated the procedure well with no immediate complications  There was excellent flow of contrast into and along the greater trochanteric bursa. Patient did have some relief after the injection. Trigger Point Inj Date/Time: 09/24/2016 8:41 AM Performed by: Magnus Sinning Authorized by: Magnus Sinning   Consent Given by:  Patient Indications:  Pain Total # of Trigger Points:  2 Needle Size:  25 G Approach:  Dorsal Medications #1:  3 mL lidocaine 1 %; 10 mg triamcinolone acetonide 40 MG/ML Comments: Chair points are palpated along the graft site incision which is probably the gluteus medius and piriformis. A needling technique was utilized.    No notes on file   Clinical History: No specialty comments available.  She reports that she quit smoking about 21 years ago. Her smoking use included Cigarettes. She has a 5.00 pack-year smoking history. She has never used smokeless tobacco. No results for input(s): HGBA1C, LABURIC in the last 8760 hours.  Objective:  VS:  HT:    WT:   BMI:     BP:(!) 154/103  HR:79bpm  TEMP: ( )  RESP:  Physical Exam  Constitutional: She is oriented to person, place, and time. She appears well-developed and well-nourished.  Eyes: Conjunctivae and EOM are normal. Pupils are equal, round, and reactive to light.  Neck: Normal range of motion. Neck supple.  Cardiovascular: Normal rate and intact distal pulses.   Pulmonary/Chest: Effort normal.  Abdominal: Soft.  Musculoskeletal:  Patient has concordant pain over the right greater trochanter. She has no pain with hip rotation. She has good distal strength. She does have palpable trigger points previously a lot of pain over the PSIS area probably related to the musculature in this area.  Neurological: She is alert and oriented to person, place, and time.    Skin: Skin is warm and dry. No rash noted. No erythema.  Psychiatric: She has a normal mood and affect. Her behavior is normal.  Nursing note and vitals reviewed.   Ortho Exam Imaging: Xr C-arm No Report  Result Date: 09/24/2016 Please see Notes or Procedures tab for imaging impression.   Past Medical/Family/Surgical/Social History: Medications & Allergies reviewed per EMR Patient Active Problem List   Diagnosis Date Noted  . Pain in right hip 09/24/2016  . Trochanteric bursitis, right hip 09/24/2016  . Myofascitis 09/24/2016  . Post laminectomy syndrome 09/24/2016  . Chronic pain syndrome 09/24/2016  . Spondylosis without myelopathy or radiculopathy, lumbar region 07/30/2016  . Anxiety and depression 01/02/2016  . Breast cancer of upper-outer quadrant of left female breast ([place]) 09/09/2014  . HTN (hypertension) 12/24/2012  . Migraine 12/24/2012  . GERD (gastroesophageal reflux disease) 12/24/2012  . Hearing loss in left ear 12/24/2012  . 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 Insomnia 12/24/2012  . Diverticulosis 12/24/2012  . Personal history of colonic polyps 12/24/2012  . Hyperlipidemia 12/24/2012  . BCC (basal cell carcinoma), arm 12/24/2012   Past Medical History:  Diagnosis Date  . Allergy   . 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 Anxiety    "sometimes" (09/23/2014)  . Cancer of left breast ([place]) 08/2014   left upper outer  . Cerebral meningioma ([place]) 09/2012   auditory; s/p excision  . Chronic lower back pain   . Chronic pain in left foot   . Deafness in left ear   . Facial droop    "left side; from brain tumor OR"  . GERD (gastroesophageal reflux disease)   . History of chemotherapy    last treatment 03/02/2015  . History of hiatal hernia   . Hypertension   . Migraine    "before brain tumor was removed" (09/23/2014)  . PONV (postoperative nausea and vomiting)    "severe"  . Stress incontinence    Family History  Problem Relation Age of  Onset  . Adopted: Yes  . Hypertension Father    Past Surgical History:  Procedure Laterality Date  . BACK SURGERY    . BRAIN SURGERY  2014   cerebellopontine angle tumor with craniotomy   . BREAST BIOPSY Left 08/2014  . BREAST RECONSTRUCTION Left 05/18/2015  . BREAST RECONSTRUCTION WITH PLACEMENT OF TISSUE EXPANDER AND FLEX HD (ACELLULAR HYDRATED DERMIS) Left 05/18/2015   Procedure: LEFT BREAST RECONSTRUCTION WITH PLACEMENT OF TISSUE EXPANDER AND ACELLULAR HYDRATED DERMIS MATRIX;  Surgeon: [name], MD;  Location: [facility];  Service: Plastics;  Laterality: Left;  . COLONOSCOPY    . FOOT FRACTURE SURGERY Left 1990's  . FOOT SURGERY Left    "one the muscle; trying to roll foot inward after failed recovery S/P fracture"  . FRACTURE SURGERY    . JOINT REPLACEMENT    . KNEE ARTHROSCOPY Left   . MASTECTOMY COMPLETE / SIMPLE W/ SENTINEL NODE BIOPSY Left 09/23/2014  . MULTIPLE TOOTH EXTRACTIONS    . PARTIAL KNEE ARTHROPLASTY Left 2000's    Dr. Sharol Given  . PATELLAR TENDON REPAIR  05/27/2012   Procedure: PATELLA TENDON REPAIR;  Surgeon: Newt Minion, MD;  Location: [facility];  Service: Orthopedics;  Laterality: Right;  Right Patella Tendon  Reconstruction with Semi Tendonosum  . PORT-A-CATH REMOVAL Right 03/16/2015   Procedure: REMOVAL PORT-A-CATH;  Surgeon: Fanny Skates, MD;  Location: [facility];  Service: General;  Laterality: Right;  . PORTACATH PLACEMENT Right 09/23/2014  . PORTACATH PLACEMENT Right 09/23/2014   Procedure: INSERTION PORT-A-CATH RIGHT SUBCLAVIAN;  Surgeon: Fanny Skates, MD;  Location: [facility];  Service: General;  Laterality: Right;  . POSTERIOR LUMBAR FUSION  02/1988   L2 reconstruction; fusion  . SIMPLE MASTECTOMY WITH AXILLARY SENTINEL NODE BIOPSY Left 09/23/2014   Procedure: LEFT TOTAL MASTECTOMY WITH LEFT AXILLARY SENTINEL LYMPH NODE BIOPSY;  Surgeon: Fanny Skates, MD;  Location: [facility];  Service: General;  Laterality: Left;  . TOTAL ABDOMINAL HYSTERECTOMY  12/1976  . TOTAL KNEE  ARTHROPLASTY Right 05/24/2009    Dr. Sharol Given  . UPPER GASTROINTESTINAL ENDOSCOPY    . WISDOM TOOTH EXTRACTION     Social History   Occupational History  . disability     [employer]   Social History Main Topics  . Smoking status: Former Smoker    Packs/day: 0.25    Years: 20.00    Types: Cigarettes    Quit date: 05/22/1995  . Smokeless tobacco: Never Used  . Alcohol use No  . Drug use: No  . Sexual activity: Yes    Partners: Male    Birth control/ protection: None     Comment: menarche age 54, P 0 ,HRT x 42 yrs

## 2016-09-24 NOTE — Patient Instructions (Signed)

## 2016-09-25 MED ORDER — TRIAMCINOLONE ACETONIDE 40 MG/ML IJ SUSP
80.0000 mg | INTRAMUSCULAR | Status: AC | PRN
  Administered 2016-09-24: 80 mg via INTRA_ARTICULAR

## 2016-09-25 MED ORDER — BUPIVACAINE HCL 0.5 % IJ SOLN
3.0000 mL | INTRAMUSCULAR | Status: AC | PRN
  Administered 2016-09-24: 3 mL via INTRA_ARTICULAR

## 2016-09-25 MED ORDER — LIDOCAINE HCL 1 % IJ SOLN
3.0000 mL | INTRAMUSCULAR | Status: AC | PRN
  Administered 2016-09-24: 3 mL

## 2016-09-25 MED ORDER — TRIAMCINOLONE ACETONIDE 40 MG/ML IJ SUSP
10.0000 mg | INTRAMUSCULAR | Status: AC | PRN
  Administered 2016-09-24: 10 mg via INTRAMUSCULAR

## 2016-10-01 ENCOUNTER — Other Ambulatory Visit: Payer: Self-pay | Admitting: Physician Assistant

## 2016-10-01 DIAGNOSIS — F329 Major depressive disorder, single episode, unspecified: Secondary | ICD-10-CM

## 2016-10-01 DIAGNOSIS — F419 Anxiety disorder, unspecified: Secondary | ICD-10-CM

## 2016-10-01 DIAGNOSIS — G4700 Insomnia, unspecified: Secondary | ICD-10-CM

## 2016-10-02 NOTE — Telephone Encounter (Signed)
Meds ordered this encounter  Medications  . zolpidem (AMBIEN) 10 MG tablet    Sig: TAKE 1 TABLET AT BEDTIME    Dispense:  30 tablet    Refill:  0    Not to exceed 5 additional fills before 03/01/2017  . ALPRAZolam (XANAX) 0.25 MG tablet    Sig: TAKE 1 TABLET BY MOUTH TWICE A DAY AS NEEDED    Dispense:  60 tablet    Refill:  0    Not to exceed 5 additional fills before 02/13/2017

## 2016-10-02 NOTE — Telephone Encounter (Signed)
[facility] 

## 2016-10-04 ENCOUNTER — Other Ambulatory Visit: Payer: Self-pay | Admitting: Physician Assistant

## 2016-10-04 ENCOUNTER — Ambulatory Visit
Admission: RE | Admit: 2016-10-04 | Discharge: 2016-10-04 | Disposition: A | Discharge status: Home / Self Care | Payer: BLUE CROSS/BLUE SHIELD | Source: Ambulatory Visit | Attending: Physician Assistant | Admitting: Physician Assistant

## 2016-10-04 DIAGNOSIS — Z853 Personal history of malignant neoplasm of breast: Secondary | ICD-10-CM

## 2016-10-04 HISTORY — DX: Personal history of antineoplastic chemotherapy: Z92.21

## 2016-10-23 DIAGNOSIS — Z853 Personal history of malignant neoplasm of breast: Secondary | ICD-10-CM | POA: Diagnosis not present

## 2016-10-23 DIAGNOSIS — Z9882 Breast implant status: Secondary | ICD-10-CM | POA: Diagnosis not present

## 2016-10-25 DIAGNOSIS — G5762 Lesion of plantar nerve, left lower limb: Secondary | ICD-10-CM | POA: Diagnosis not present

## 2016-10-28 ENCOUNTER — Other Ambulatory Visit: Payer: Self-pay | Admitting: Physician Assistant

## 2016-10-28 DIAGNOSIS — G4700 Insomnia, unspecified: Secondary | ICD-10-CM

## 2016-10-30 ENCOUNTER — Telehealth: Payer: Self-pay | Admitting: Physician Assistant

## 2016-10-30 DIAGNOSIS — G4700 Insomnia, unspecified: Secondary | ICD-10-CM

## 2016-10-30 DIAGNOSIS — F329 Major depressive disorder, single episode, unspecified: Secondary | ICD-10-CM

## 2016-10-30 DIAGNOSIS — F419 Anxiety disorder, unspecified: Secondary | ICD-10-CM

## 2016-10-30 MED ORDER — ZOLPIDEM TARTRATE 10 MG PO TABS: 10.0000 mg | ORAL_TABLET | Freq: Every day | ORAL | 0 refills | Status: DC

## 2016-10-30 MED ORDER — ALPRAZOLAM 0.25 MG PO TABS: 0.2500 mg | ORAL_TABLET | Freq: Two times a day (BID) | ORAL | 0 refills | Status: DC | PRN

## 2016-10-30 NOTE — Telephone Encounter (Signed)
10/02/16 last refills

## 2016-10-30 NOTE — Telephone Encounter (Signed)
Pt has called twice today to see if medication to help her sleep has been refilled she is completely out   Best number [phone]

## 2016-10-30 NOTE — Telephone Encounter (Signed)
Printed. Please call in, or place in my box to sign tomorrow morning.  Meds ordered this encounter  Medications  . ALPRAZolam (XANAX) 0.25 MG tablet    Sig: Take 1 tablet (0.25 mg total) by mouth 2 (two) times daily as needed.    Dispense:  60 tablet    Refill:  0    Not to exceed 5 additional fills before 02/13/2017  . zolpidem (AMBIEN) 10 MG tablet    Sig: Take 1 tablet (10 mg total) by mouth at bedtime.    Dispense:  30 tablet    Refill:  0    Not to exceed 5 additional fills before 03/01/2017

## 2016-10-30 NOTE — Telephone Encounter (Signed)
Please review

## 2016-10-30 NOTE — Telephone Encounter (Signed)
Called to [facility] on [address], l/m for pt

## 2016-11-01 ENCOUNTER — Other Ambulatory Visit: Payer: Self-pay | Admitting: Physician Assistant

## 2016-11-01 DIAGNOSIS — F419 Anxiety disorder, unspecified: Secondary | ICD-10-CM

## 2016-11-01 DIAGNOSIS — F329 Major depressive disorder, single episode, unspecified: Secondary | ICD-10-CM

## 2016-11-07 DIAGNOSIS — G5762 Lesion of plantar nerve, left lower limb: Secondary | ICD-10-CM | POA: Diagnosis not present

## 2016-11-12 DIAGNOSIS — Z853 Personal history of malignant neoplasm of breast: Secondary | ICD-10-CM | POA: Diagnosis not present

## 2016-11-12 DIAGNOSIS — Z9882 Breast implant status: Secondary | ICD-10-CM | POA: Diagnosis not present

## 2016-11-19 DIAGNOSIS — Z9882 Breast implant status: Secondary | ICD-10-CM | POA: Diagnosis not present

## 2016-11-19 DIAGNOSIS — Z853 Personal history of malignant neoplasm of breast: Secondary | ICD-10-CM | POA: Diagnosis not present

## 2016-11-29 ENCOUNTER — Other Ambulatory Visit: Payer: Self-pay | Admitting: Physician Assistant

## 2016-11-29 DIAGNOSIS — G4700 Insomnia, unspecified: Secondary | ICD-10-CM

## 2016-11-30 NOTE — Telephone Encounter (Signed)
Meds ordered this encounter  Medications  . zolpidem (AMBIEN) 10 MG tablet    Sig: TAKE 1 TABLET BY MOUTH AT BEDTIME    Dispense:  30 tablet    Refill:  0    Not to exceed 5 additional fills before 04/28/2017

## 2016-11-30 NOTE — Telephone Encounter (Signed)
Called to pharmacy 

## 2016-12-02 DIAGNOSIS — S92535A Nondisplaced fracture of distal phalanx of left lesser toe(s), initial encounter for closed fracture: Secondary | ICD-10-CM | POA: Diagnosis not present

## 2016-12-06 ENCOUNTER — Other Ambulatory Visit: Payer: Self-pay | Admitting: Physician Assistant

## 2016-12-06 DIAGNOSIS — F32A Depression, unspecified: Secondary | ICD-10-CM

## 2016-12-06 DIAGNOSIS — F329 Major depressive disorder, single episode, unspecified: Secondary | ICD-10-CM

## 2016-12-06 DIAGNOSIS — F419 Anxiety disorder, unspecified: Secondary | ICD-10-CM

## 2016-12-06 NOTE — Telephone Encounter (Signed)
[facility] 

## 2016-12-06 NOTE — Telephone Encounter (Signed)
Patient notified via My Chart.  Meds ordered this encounter  Medications  . ALPRAZolam (XANAX) 0.25 MG tablet    Sig: TAKE 1 TABLET BY MOUTH TWICE A DAY AS NEEDED    Dispense:  60 tablet    Refill:  0    Not to exceed 5 additional fills before 04/28/2017

## 2016-12-24 ENCOUNTER — Encounter: Payer: Self-pay | Admitting: Physician Assistant

## 2016-12-24 ENCOUNTER — Ambulatory Visit (INDEPENDENT_AMBULATORY_CARE_PROVIDER_SITE_OTHER): Payer: BLUE CROSS/BLUE SHIELD | Admitting: Physician Assistant

## 2016-12-24 VITALS — BP 143/100 | HR 80 | Temp 98.0°F | Resp 18 | Ht 64.76 in | Wt 158.4 lb

## 2016-12-24 DIAGNOSIS — L723 Sebaceous cyst: Secondary | ICD-10-CM | POA: Diagnosis not present

## 2016-12-24 DIAGNOSIS — Z114 Encounter for screening for human immunodeficiency virus [HIV]: Secondary | ICD-10-CM | POA: Diagnosis not present

## 2016-12-24 DIAGNOSIS — I10 Essential (primary) hypertension: Secondary | ICD-10-CM | POA: Diagnosis not present

## 2016-12-24 DIAGNOSIS — E785 Hyperlipidemia, unspecified: Secondary | ICD-10-CM

## 2016-12-24 DIAGNOSIS — L089 Local infection of the skin and subcutaneous tissue, unspecified: Secondary | ICD-10-CM

## 2016-12-24 MED ORDER — DOXYCYCLINE HYCLATE 100 MG PO CAPS
100.0000 mg | ORAL_CAPSULE | Freq: Two times a day (BID) | ORAL | 0 refills | Status: AC
Start: 2016-12-24 — End: 2017-01-03

## 2016-12-24 NOTE — Patient Instructions (Addendum)
[facility]    IF you received an x-ray today, you will receive an invoice from [facility]. Please contact [facility] at [phone] with questions or concerns regarding your invoice.   IF you received labwork today, you will receive an invoice from [facility]. Please contact [facility] at [phone] with questions or concerns regarding your invoice.   Our billing staff will not be able to assist you with questions regarding bills from these companies.  You will be contacted with the lab results as soon as they are available. The fastest way to get your results is to activate your My Chart account. Instructions are located on the last page of this paperwork. If you have not heard from [place] regarding the results in 2 weeks, please contact this office.

## 2016-12-24 NOTE — Progress Notes (Signed)
Patient ID: Amy [name], female    DOB: [birth year], 66 y.o.   MRN: [id number]  PCP: [name], PA-C  Chief Complaint  Patient presents with  . Follow-up    6 months     Subjective:   Presents for evaluation of HTN, hyperlipidemia.  In general, she's doing well. Things have been much less chaotic and difficult regarding her own health. Her best friend died of breast cancer recently. She had been diagnosed on her 52th birthday and underwent lumpectomy. She did not tolerate chemotherapy and refused radiation therapy, and died one year later. As a breast cancer survivor herself, this has been difficult for the patient.  She is tolerating her mendications well without adverse effects.  Tender lump on the back x several weeks.  More back sleeping as she has been having the breast reconstruction on the LEFT.   Review of Systems No chest pain, SOB, HA, dizziness, vision change, N/V, diarrhea, constipation, dysuria, urinary urgency or frequency, myalgias, arthralgias.     Patient Active Problem List   Diagnosis Date Noted  . Pain in right hip 09/24/2016  . Trochanteric bursitis, right hip 09/24/2016  . Myofascitis 09/24/2016  . Post laminectomy syndrome 09/24/2016  . Chronic pain syndrome 09/24/2016  . Spondylosis without myelopathy or radiculopathy, lumbar region 07/30/2016  . Anxiety and depression 01/02/2016  . Hx of breast reconstruction 09/27/2015  . Migraine aura, persistent 09/22/2015  . Breast cancer of upper-outer quadrant of left female breast ([place]) 09/09/2014  . HTN (hypertension) 12/24/2012  . Migraine 12/24/2012  . GERD (gastroesophageal reflux disease) 12/24/2012  . Hearing loss in left ear 12/24/2012  . 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 Insomnia 12/24/2012  . Diverticulosis 12/24/2012  . Personal history of colonic polyps 12/24/2012  . Hyperlipidemia 12/24/2012  . BCC (basal cell carcinoma), arm 12/24/2012     Prior to Admission medications     Medication Sig Start Date End Date Taking? Authorizing Provider  ALPRAZolam Duanne Moron) 0.25 MG tablet TAKE 1 TABLET BY MOUTH TWICE A DAY AS NEEDED 12/06/16  Yes Mercy Malena, PA-C  citalopram (CELEXA) 40 MG tablet TAKE 1 TABLET BY MOUTH EVERY DAY 01/02/16  Yes Colon Rueth, PA-C  HYDROcodone-acetaminophen (NORCO/VICODIN) 5-325 MG tablet  09/06/15  Yes [provider]  lansoprazole (PREVACID) 15 MG capsule Take 30 mg by mouth daily at 12 noon. Reported on 01/02/2016   Yes [provider]  metoprolol succinate (TOPROL-XL) 100 MG 24 hr tablet TAKE 1 TABLET (100 MG TOTAL) BY MOUTH DAILY.  TAKE WITH OR IMMEDIATELY FOLLOWING A MEAL. 01/02/16  Yes Ferdinand Revoir, PA-C  verapamil (VERELAN PM) 180 MG 24 hr capsule Take 1 capsule (180 mg total) by mouth at bedtime. 01/02/16  Yes Rumaldo Difatta, PA-C  zolpidem (AMBIEN) 10 MG tablet TAKE 1 TABLET BY MOUTH AT BEDTIME 11/30/16  Yes Taliana Mersereau, PA-C     Allergies  Allergen Reactions  . Codeine Nausea And Vomiting  . Darvocet [Propoxyphene N-Acetaminophen] Other (See Comments)    Pruritis, rapid heart rate  . Tramadol Other (See Comments)    Chest tightness  . Gold-Containing Drug Products Swelling  . Meperidine Nausea And Vomiting  . Morphine Nausea And Vomiting  . Sulfa Antibiotics Rash       Objective:  Physical Exam  Constitutional: She is oriented to person, place, and time. She appears well-developed and well-nourished. She is active and cooperative. No distress.  BP (!) 143/100   Pulse 80   Temp 98 F (36.7  C) (Oral)   Resp 18   Ht 5' 4.76" (1.645 m)   Wt 158 lb 6.4 oz (71.8 kg)   SpO2 97%   BMI 26.55 kg/m   HENT:  Head: Normocephalic and atraumatic.  Right Ear: Hearing normal.  Left Ear: Hearing normal.  Eyes: Conjunctivae are normal. No scleral icterus.  Neck: Normal range of motion. Neck supple. No thyromegaly present.  Cardiovascular: Normal rate, regular rhythm and normal heart sounds.   Pulses:       Radial pulses are 2+ on the right side, and 2+ on the left side.  Pulmonary/Chest: Effort normal and breath sounds normal.  Lymphadenopathy:       Head (right side): No tonsillar, no preauricular, no posterior auricular and no occipital adenopathy present.       Head (left side): No tonsillar, no preauricular, no posterior auricular and no occipital adenopathy present.    She has no cervical adenopathy.       Right: No supraclavicular adenopathy present.       Left: No supraclavicular adenopathy present.  Neurological: She is alert and oriented to person, place, and time. No sensory deficit.  Skin: Skin is warm, dry and intact. Lesion (mid back, tender 2 cm lump. mild erythema, not fluctuant, central dialated pore.) noted. No rash noted. No cyanosis or erythema. Nails show no clubbing.  Psychiatric: She has a normal mood and affect. Her speech is normal and behavior is normal.           Assessment & Plan:   Problem List Items Addressed This Visit    HTN (hypertension) - Primary    Above goal today. Patient left without recheck. Update labs. Will ask patient to check BP and record results, and plan to increase verapamil dose if remains >140/90.      Relevant Orders   CBC with Differential/Platelet (Completed)   Comprehensive metabolic panel (Completed)   TSH (Completed)   T4, free (Completed)   Care order/instruction: (Completed)   Hyperlipidemia    Await labs.       Relevant Orders   Comprehensive metabolic panel (Completed)   Lipid panel (Completed)    Other Visit Diagnoses    Screening for HIV (human immunodeficiency virus)       Relevant Orders   HIV antibody (Completed)   Infected sebaceous cyst of skin       Doxycycline. Warm compresses. if not improving, RTC for possible I&D.   Relevant Medications   doxycycline (VIBRAMYCIN) 100 MG capsule       Return in about 6 months (around 06/26/2017).   Fara Chute, PA-C [facility]

## 2016-12-25 LAB — COMPREHENSIVE METABOLIC PANEL
ALK PHOS: 63 IU/L (ref 39–117)
ALT: 12 IU/L (ref 0–32)
AST: 15 IU/L (ref 0–40)
Albumin/Globulin Ratio: 2.3 — ABNORMAL HIGH (ref 1.2–2.2)
Albumin: 4.4 g/dL (ref 3.6–4.8)
BILIRUBIN TOTAL: 0.3 mg/dL (ref 0.0–1.2)
BUN / CREAT RATIO: 10 — AB (ref 12–28)
BUN: 6 mg/dL — AB (ref 8–27)
CHLORIDE: 103 mmol/L (ref 96–106)
CO2: 24 mmol/L (ref 18–29)
Calcium: 9.5 mg/dL (ref 8.7–10.3)
Creatinine, Ser: 0.63 mg/dL (ref 0.57–1.00)
GFR calc Af Amer: 110 mL/min/{1.73_m2} (ref >59)
GFR calc non Af Amer: 96 mL/min/{1.73_m2} (ref >59)
GLUCOSE: 93 mg/dL (ref 65–99)
Globulin, Total: 1.9 g/dL (ref 1.5–4.5)
POTASSIUM: 4.3 mmol/L (ref 3.5–5.2)
Sodium: 140 mmol/L (ref 134–144)
Total Protein: 6.3 g/dL (ref 6.0–8.5)

## 2016-12-25 LAB — LIPID PANEL
CHOL/HDL RATIO: 4.3 ratio (ref 0.0–4.4)
CHOLESTEROL TOTAL: 197 mg/dL (ref 100–199)
HDL: 46 mg/dL (ref >39)
LDL Calculated: 126 mg/dL — ABNORMAL HIGH (ref 0–99)
TRIGLYCERIDES: 124 mg/dL (ref 0–149)
VLDL Cholesterol Cal: 25 mg/dL (ref 5–40)

## 2016-12-25 LAB — CBC WITH DIFFERENTIAL/PLATELET
BASOS ABS: 0 10*3/uL (ref 0.0–0.2)
Basos: 1 %
EOS (ABSOLUTE): 0.1 10*3/uL (ref 0.0–0.4)
Eos: 3 %
Hematocrit: 39.9 % (ref 34.0–46.6)
Hemoglobin: 12.9 g/dL (ref 11.1–15.9)
Immature Grans (Abs): 0 10*3/uL (ref 0.0–0.1)
Immature Granulocytes: 0 %
LYMPHS ABS: 2.1 10*3/uL (ref 0.7–3.1)
Lymphs: 46 %
MCH: 27.2 pg (ref 26.6–33.0)
MCHC: 32.3 g/dL (ref 31.5–35.7)
MCV: 84 fL (ref 79–97)
MONOCYTES: 12 %
Monocytes Absolute: 0.5 10*3/uL (ref 0.1–0.9)
NEUTROS ABS: 1.7 10*3/uL (ref 1.4–7.0)
Neutrophils: 38 %
PLATELETS: 386 10*3/uL — AB (ref 150–379)
RBC: 4.74 x10E6/uL (ref 3.77–5.28)
RDW: 13.7 % (ref 12.3–15.4)
WBC: 4.5 10*3/uL (ref 3.4–10.8)

## 2016-12-25 LAB — TSH: TSH: 1.49 u[IU]/mL (ref 0.450–4.500)

## 2016-12-25 LAB — T4, FREE: FREE T4: 1.39 ng/dL (ref 0.82–1.77)

## 2016-12-25 LAB — HIV ANTIBODY (ROUTINE TESTING W REFLEX): HIV Screen 4th Generation wRfx: NONREACTIVE

## 2016-12-27 NOTE — Assessment & Plan Note (Signed)
Above goal today. Patient left without recheck. Update labs. Will ask patient to check BP and record results, and plan to increase verapamil dose if remains >140/90.

## 2016-12-27 NOTE — Assessment & Plan Note (Signed)
Await labs.

## 2016-12-28 ENCOUNTER — Other Ambulatory Visit: Payer: Self-pay | Admitting: Physician Assistant

## 2016-12-28 DIAGNOSIS — G4700 Insomnia, unspecified: Secondary | ICD-10-CM

## 2016-12-28 IMAGING — CR DG CHEST 2V
2 series · 2 of 2 positions shown · non-contrast
Comparison: None.

CLINICAL DATA: 63-year-old female preparing for left total
mastectomy on 09/23/2014. Preoperative chest x-ray.

EXAM:
CHEST  2 VIEW

[w chest pa]
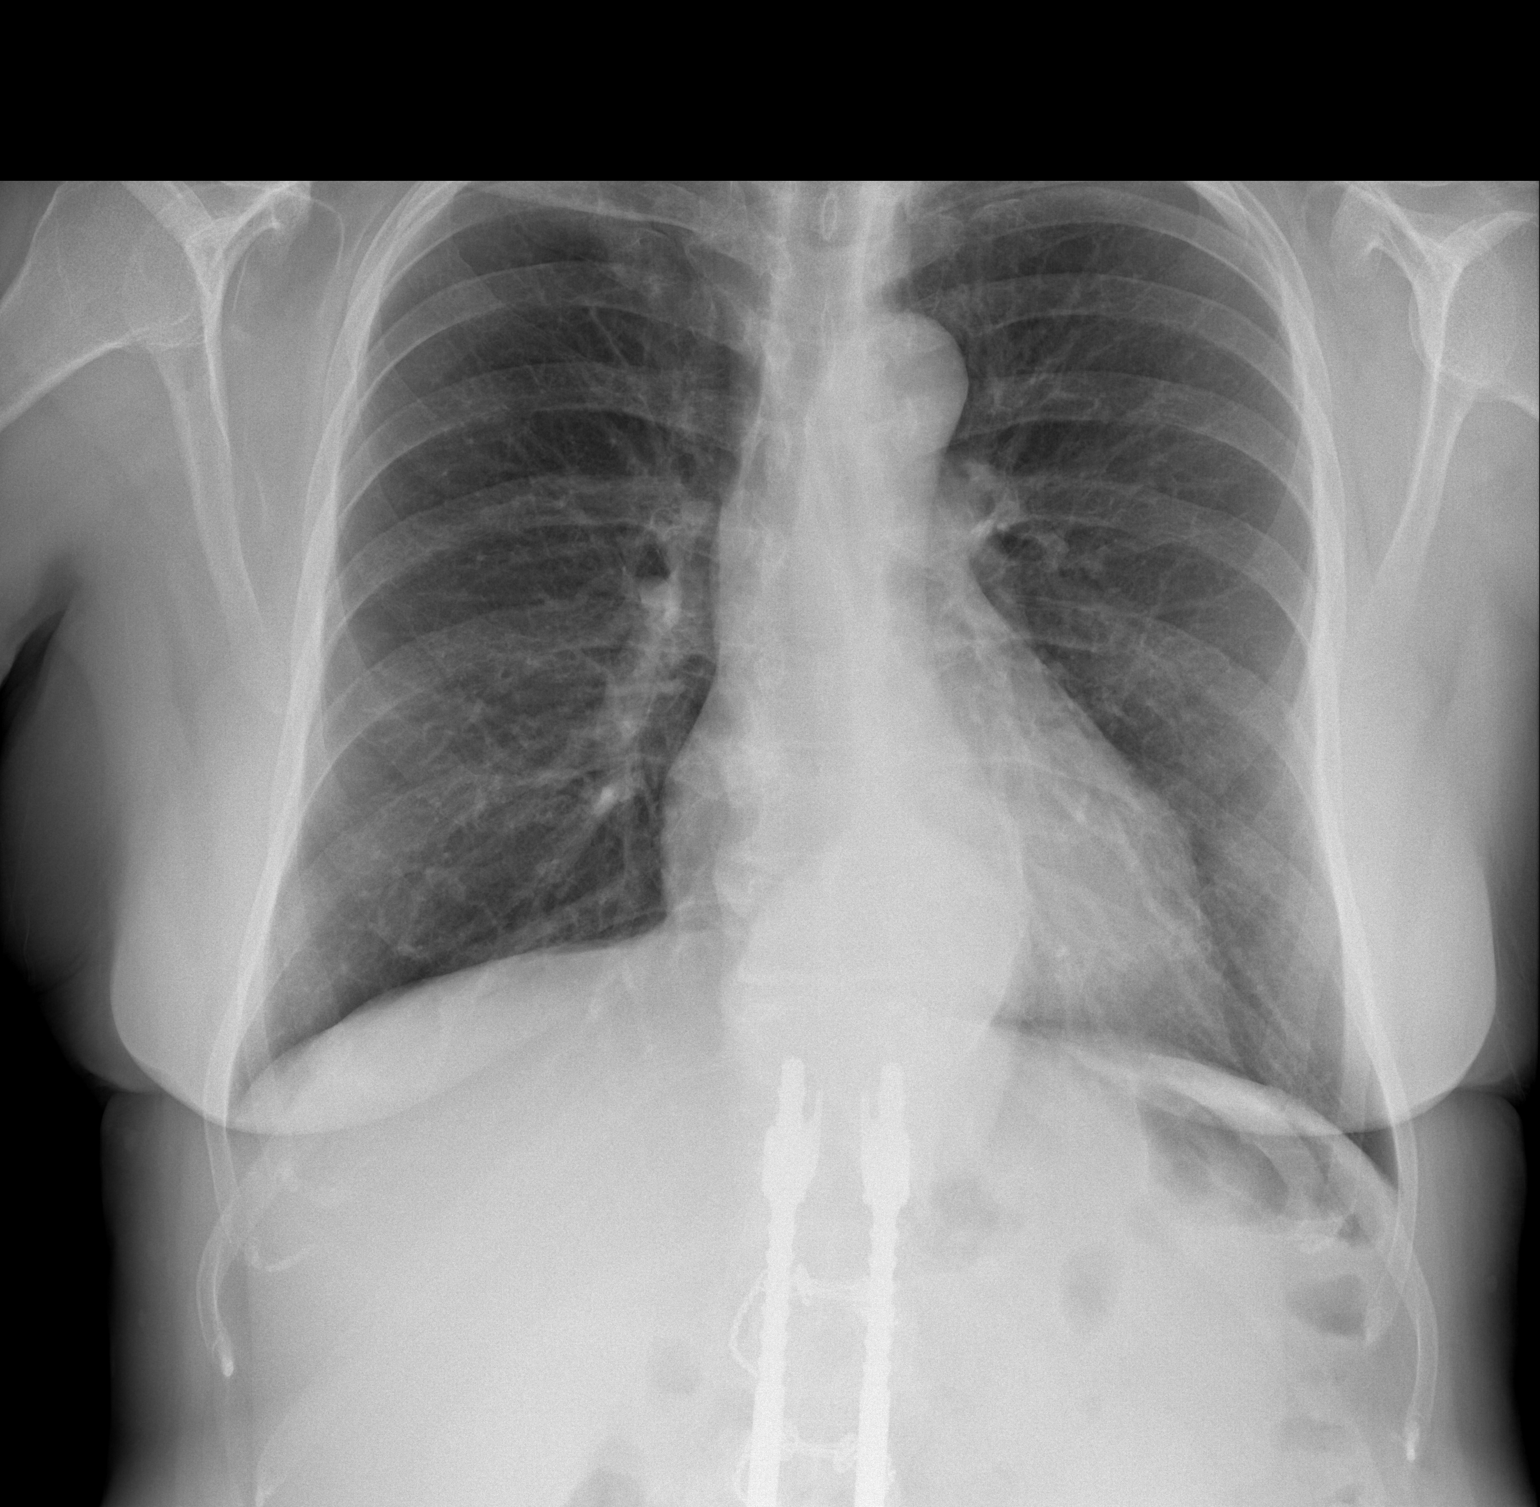

[w chest lat]
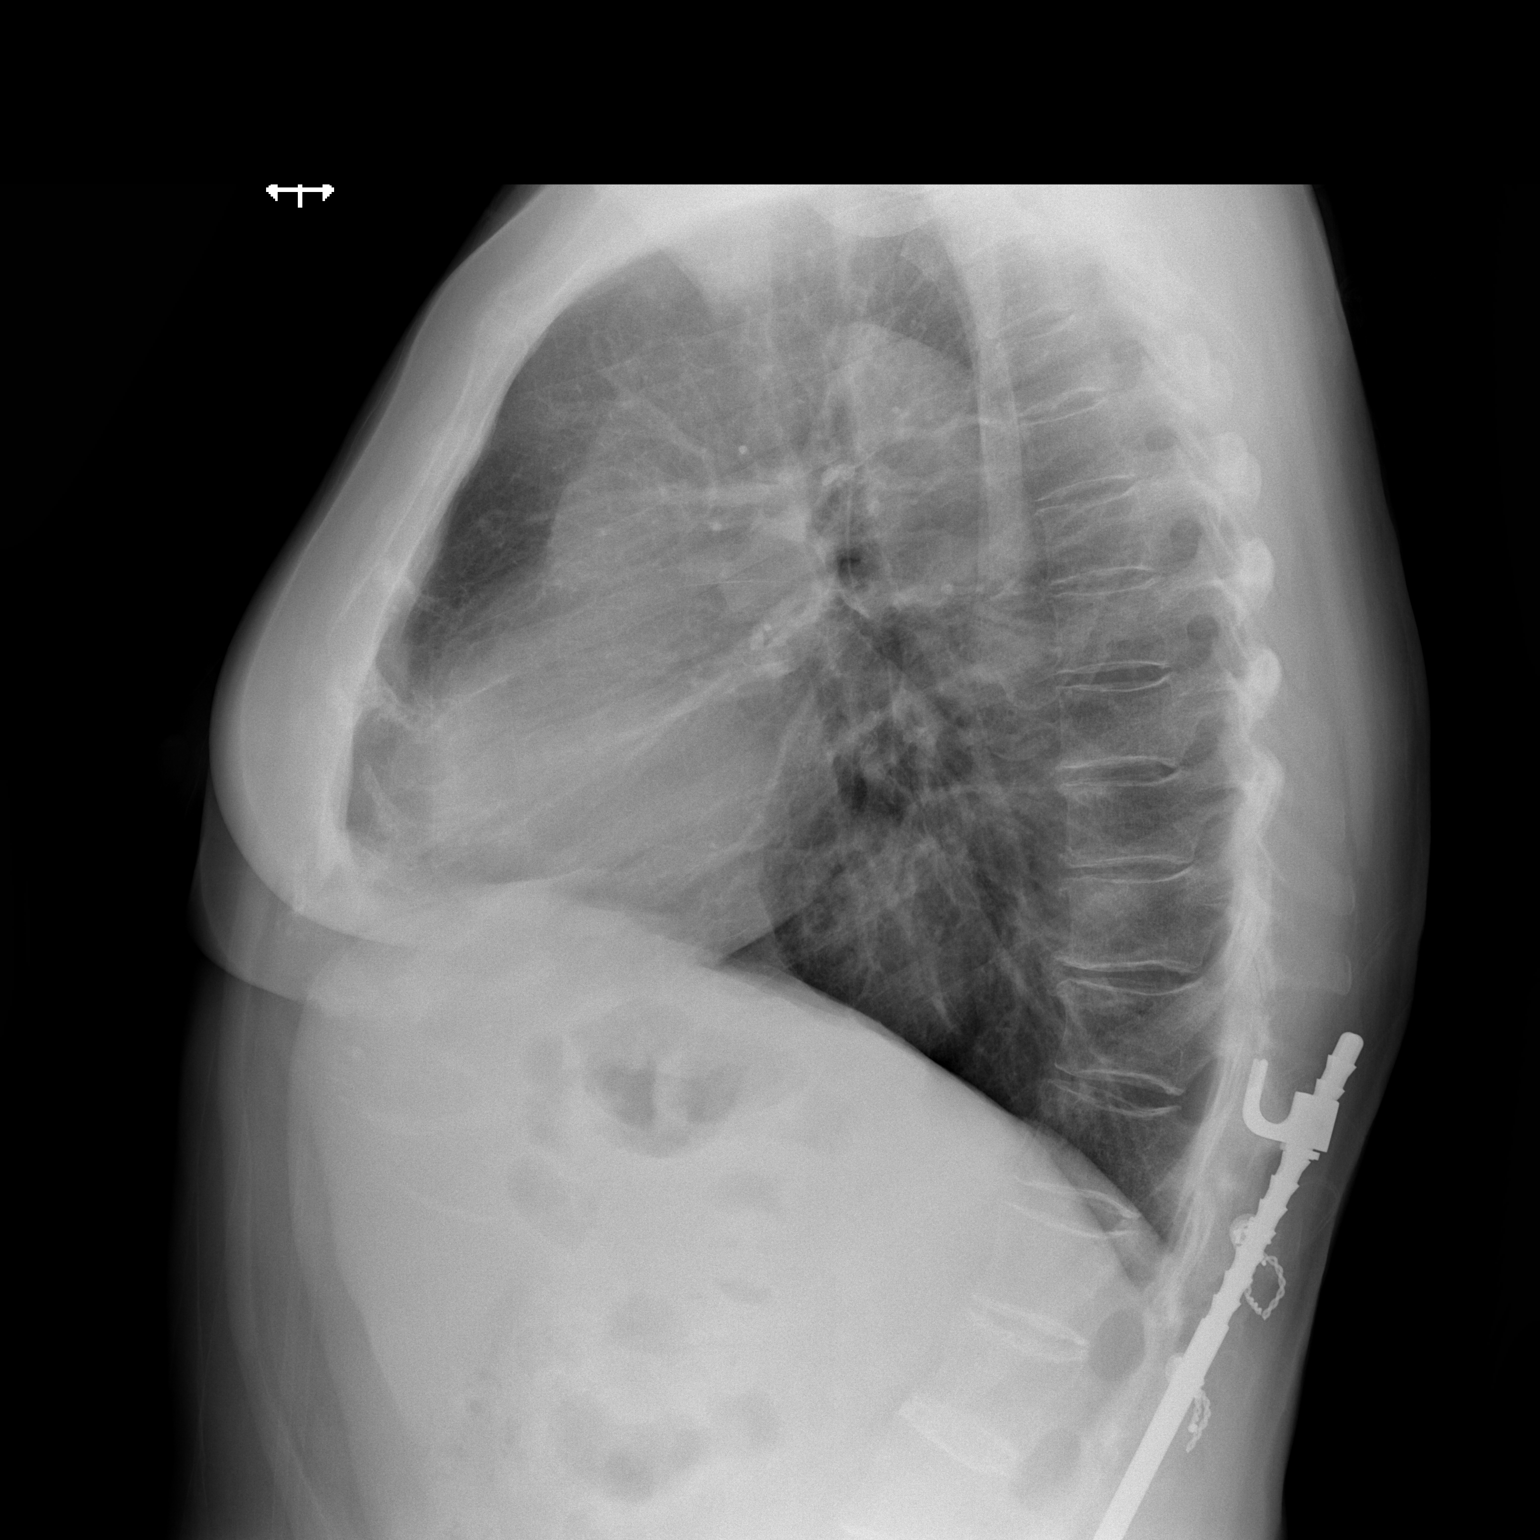

[2 of 2 positions shown; findings below may reference images not displayed]

FINDINGS: The lungs are clear and negative for focal airspace consolidation,
pulmonary edema or suspicious pulmonary nodule. Mild central
bronchitic changes which are similar compared to prior. No pleural
effusion or pneumothorax. Cardiac and mediastinal contours are
within normal limits. No acute fracture or lytic or blastic osseous
lesions. The visualized upper abdominal bowel gas pattern is
unremarkable. Hiatal hernia. Incompletely imaged posterior spine
fusion hardware.
IMPRESSION: No active cardiopulmonary disease.

Hiatal hernia.

## 2016-12-30 MED ORDER — VERAPAMIL HCL ER 240 MG PO CP24: 240.0000 mg | ORAL_CAPSULE | Freq: Every day | ORAL | 3 refills | Status: DC

## 2016-12-30 NOTE — Addendum Note (Signed)
Addended by: Fara Chute on: 12/30/2016 09:27 AM   Modules accepted: Orders

## 2016-12-31 NOTE — Telephone Encounter (Signed)
Pt called checking on status of this message. She is completely out of this med. Please advise

## 2016-12-31 NOTE — Telephone Encounter (Signed)
Rx printed at 104. Will bring to 102 after clinic.  Meds ordered this encounter  Medications  . zolpidem (AMBIEN) 10 MG tablet    Sig: TAKE 1 TABLET BY MOUTH AT BEDTIME    Dispense:  30 tablet    Refill:  0    Not to exceed 5 additional fills before 05/29/2017

## 2016-12-31 NOTE — Telephone Encounter (Signed)
11/30/16 last refill 12/2016 last ov

## 2017-01-01 NOTE — Telephone Encounter (Signed)
Pt clld back and notified Rx is clld in

## 2017-01-01 NOTE — Telephone Encounter (Signed)
[facility] on file, lmvm for pt to call me back

## 2017-01-08 ENCOUNTER — Telehealth (INDEPENDENT_AMBULATORY_CARE_PROVIDER_SITE_OTHER): Payer: Self-pay | Admitting: Physical Medicine and Rehabilitation

## 2017-01-08 NOTE — Telephone Encounter (Signed)
ok 

## 2017-01-08 NOTE — Telephone Encounter (Signed)
Scheduled for 01/17/17 at 0830.

## 2017-01-13 ENCOUNTER — Other Ambulatory Visit: Payer: Self-pay | Admitting: Physician Assistant

## 2017-01-13 DIAGNOSIS — G4700 Insomnia, unspecified: Secondary | ICD-10-CM

## 2017-01-13 DIAGNOSIS — F329 Major depressive disorder, single episode, unspecified: Secondary | ICD-10-CM

## 2017-01-13 DIAGNOSIS — I10 Essential (primary) hypertension: Secondary | ICD-10-CM

## 2017-01-13 DIAGNOSIS — F419 Anxiety disorder, unspecified: Secondary | ICD-10-CM

## 2017-01-13 DIAGNOSIS — F32A Depression, unspecified: Secondary | ICD-10-CM

## 2017-01-14 NOTE — Telephone Encounter (Signed)
Meds ordered this encounter  Medications  . DISCONTD: verapamil (VERELAN PM) 180 MG 24 hr capsule    Sig: TAKE 1 CAPSULE (180 MG TOTAL) BY MOUTH AT BEDTIME.    Dispense:  180 capsule    Refill:  1  . citalopram (CELEXA) 40 MG tablet    Sig: TAKE 1 TABLET BY MOUTH EVERY DAY    Dispense:  90 tablet    Refill:  3  . ALPRAZolam (XANAX) 0.25 MG tablet    Sig: TAKE 1 TABLET TWICE A DAY AS NEEDED    Dispense:  60 tablet    Refill:  0    Not to exceed 5 additional fills before 06/04/2017   Spoke with pharmacist to clarify verapamil dose. They will fill the newer dose they have on file, rather than the 180 mg.

## 2017-01-14 NOTE — Telephone Encounter (Signed)
12/24/16 last ov 12/06/16 last refill alprazolam

## 2017-01-14 NOTE — Telephone Encounter (Signed)
Called in alprazolam

## 2017-01-16 ENCOUNTER — Telehealth: Payer: Self-pay

## 2017-01-16 NOTE — Telephone Encounter (Signed)
verapimil er 240 on back order can they sub for calan sr 240? If ok please send new rx

## 2017-01-17 ENCOUNTER — Encounter (INDEPENDENT_AMBULATORY_CARE_PROVIDER_SITE_OTHER): Payer: Self-pay | Admitting: Physical Medicine and Rehabilitation

## 2017-01-17 ENCOUNTER — Ambulatory Visit (INDEPENDENT_AMBULATORY_CARE_PROVIDER_SITE_OTHER): Payer: BLUE CROSS/BLUE SHIELD | Admitting: Physical Medicine and Rehabilitation

## 2017-01-17 ENCOUNTER — Ambulatory Visit (INDEPENDENT_AMBULATORY_CARE_PROVIDER_SITE_OTHER): Payer: Medicare Other

## 2017-01-17 DIAGNOSIS — M7061 Trochanteric bursitis, right hip: Secondary | ICD-10-CM | POA: Diagnosis not present

## 2017-01-17 MED ORDER — VERAPAMIL HCL ER 240 MG PO TBCR: 240.0000 mg | EXTENDED_RELEASE_TABLET | Freq: Every day | ORAL | 3 refills | Status: DC

## 2017-01-17 MED ORDER — LIDOCAINE HCL 2 % IJ SOLN
4.0000 mL | INTRAMUSCULAR | Status: AC | PRN
  Administered 2017-01-17: 4 mL

## 2017-01-17 MED ORDER — TRIAMCINOLONE ACETONIDE 40 MG/ML IJ SUSP
80.0000 mg | INTRAMUSCULAR | Status: AC | PRN
  Administered 2017-01-17: 80 mg via INTRA_ARTICULAR

## 2017-01-17 MED ORDER — BUPIVACAINE HCL 0.25 % IJ SOLN
4.0000 mL | INTRAMUSCULAR | Status: AC | PRN
  Administered 2017-01-17: 4 mL via INTRA_ARTICULAR

## 2017-01-17 NOTE — Telephone Encounter (Signed)
Meds ordered this encounter  Medications  . verapamil (CALAN-SR) 240 MG CR tablet    Sig: Take 1 tablet (240 mg total) by mouth at bedtime.    Dispense:  90 tablet    Refill:  3    Order Specific Question:   Supervising Provider    Answer:   Brigitte Pulse, EVA N [[id number]]

## 2017-01-17 NOTE — Progress Notes (Deleted)
Right hip lateral and posterior hip.

## 2017-01-17 NOTE — Patient Instructions (Signed)

## 2017-01-17 NOTE — Progress Notes (Signed)
LEO WEYANDT - 66 y.o. female MRN [id number]  Date of birth: 20-Dec-1950  Office Visit Note: Visit Date: 01/17/2017 PCP: [name], PA-C Referred by: [name], PA-C  Subjective: Chief Complaint  Patient presents with  . Right Hip - Pain   HPI: Mrs. Rosano is a 66 year old female with fairly incredible medical history of long lumbar fusion for traumatic injury and then surviving breast cancer with double vasectomy as well as meningioma resection with complications of stroke. She comes in today with worsening right lateral hip pain. Prior injection performed in February of her really great relief of this for quite some time. She is also been having some knee problems as she does see Dr. Sharol Given. We will repeat the bursa injection today.    ROS Otherwise per HPI.  Assessment & Plan: Visit Diagnoses:  1. Trochanteric bursitis, right hip     Plan: Findings:  Right greater trochanteric bursa injection fluoroscopic guidance. Guidance was used due to body habitus. Patient did have relief during the anesthetic phase.    Meds & Orders: No orders of the defined types were placed in this encounter.   Orders Placed This Encounter  Procedures  . Large Joint Injection/Arthrocentesis  . XR C-ARM NO REPORT    Follow-up: Return if symptoms worsen or fail to improve.   Procedures: Large Joint Inj Date/Time: 01/17/2017 9:00 AM Performed by: Magnus Sinning Authorized by: Magnus Sinning   Consent Given by:  Parent Site marked: the procedure site was marked   Timeout: prior to procedure the correct patient, procedure, and site was verified   Indications:  Pain and diagnostic evaluation Location:  Hip Site:  R greater trochanter Prep: patient was prepped and draped in usual sterile fashion   Needle Size:  22 G Needle Length:  3.5 inches Approach:  Lateral Ultrasound Guidance: No   Fluoroscopic Guidance: Yes   Arthrogram: No   Medications:  80 mg triamcinolone  acetonide 40 MG/ML; 4 mL lidocaine 2 %; 4 mL bupivacaine 0.25 % Aspiration Attempted: No   Patient tolerance:  Patient tolerated the procedure well with no immediate complications  There was excellent flow of contrast outlined the greater trochanteric bursa without vascular uptake.    No notes on file   Clinical History: No specialty comments available.  She reports that she quit smoking about 21 years ago. Her smoking use included Cigarettes. She has a 5.00 pack-year smoking history. She has never used smokeless tobacco. No results for input(s): HGBA1C, LABURIC in the last 8760 hours.  Objective:  VS:  HT:    WT:   BMI:     BP:   HR: bpm  TEMP: ( )  RESP:  Physical Exam  Musculoskeletal:  Patient does have pain over the right greater trochanter. No pain with hip rotation. Good distal strength.    Ortho Exam Imaging: No results found.  Past Medical/Family/Surgical/Social History: Medications & Allergies reviewed per EMR Patient Active Problem List   Diagnosis Date Noted  . Pain in right hip 09/24/2016  . Trochanteric bursitis, right hip 09/24/2016  . Myofascitis 09/24/2016  . Post laminectomy syndrome 09/24/2016  . Chronic pain syndrome 09/24/2016  . Spondylosis without myelopathy or radiculopathy, lumbar region 07/30/2016  . Anxiety and depression 01/02/2016  . Hx of breast reconstruction 09/27/2015  . Migraine aura, persistent 09/22/2015  . Breast cancer of upper-outer quadrant of left female breast ([place]) 09/09/2014  . HTN (hypertension) 12/24/2012  . Migraine 12/24/2012  . GERD (gastroesophageal reflux disease) 12/24/2012  .  Hearing loss in left ear 12/24/2012  . 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 Insomnia 12/24/2012  . Diverticulosis 12/24/2012  . Personal history of colonic polyps 12/24/2012  . Hyperlipidemia 12/24/2012  . BCC (basal cell carcinoma), arm 12/24/2012   Past Medical History:  Diagnosis Date  . Allergy   . 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 Anxiety    "sometimes" (09/23/2014)  . Cancer of left breast ([place]) 08/2014   left upper outer  . Cerebral meningioma ([place]) 09/2012   auditory; s/p excision  . Chronic lower back pain   . Chronic pain in left foot   . Deafness in left ear   . Facial droop    "left side; from brain tumor OR"  . GERD (gastroesophageal reflux disease)   . History of chemotherapy    last treatment 03/02/2015  . History of hiatal hernia   . Hypertension   . Migraine    "before brain tumor was removed" (09/23/2014)  . Personal history of chemotherapy 09/12/2014  . PONV (postoperative nausea and vomiting)    "severe"  . Stress incontinence    Family History  Problem Relation Age of Onset  . Adopted: Yes  . Hypertension Father    Past Surgical History:  Procedure Laterality Date  . AUGMENTATION MAMMAPLASTY Bilateral 11/11/2015  . BACK SURGERY    . BRAIN SURGERY  2014   cerebellopontine angle tumor with craniotomy   . BREAST BIOPSY Left 08/2014  . BREAST RECONSTRUCTION Left 05/18/2015  . BREAST RECONSTRUCTION WITH PLACEMENT OF TISSUE EXPANDER AND FLEX HD (ACELLULAR HYDRATED DERMIS) Left 05/18/2015   Procedure: LEFT BREAST RECONSTRUCTION WITH PLACEMENT OF TISSUE EXPANDER AND ACELLULAR HYDRATED DERMIS MATRIX;  Surgeon: [name], MD;  Location: [facility];  Service: Plastics;  Laterality: Left;  . COLONOSCOPY    . FOOT FRACTURE SURGERY Left 1990's  . FOOT SURGERY Left    "one the muscle; trying to roll foot inward after failed recovery S/P fracture"  . FRACTURE SURGERY    . JOINT REPLACEMENT    . KNEE ARTHROSCOPY Left   . MASTECTOMY COMPLETE / SIMPLE W/ SENTINEL NODE BIOPSY Left 09/23/2014  . MULTIPLE TOOTH EXTRACTIONS    . PARTIAL KNEE ARTHROPLASTY Left 2000's    Dr. Sharol Given  . PATELLAR TENDON REPAIR  05/27/2012   Procedure: PATELLA TENDON REPAIR;  Surgeon: Newt Minion, MD;  Location: [facility];  Service: Orthopedics;  Laterality: Right;  Right Patella Tendon  Reconstruction with  Semi Tendonosum  . PORT-A-CATH REMOVAL Right 03/16/2015   Procedure: REMOVAL PORT-A-CATH;  Surgeon: Fanny Skates, MD;  Location: [facility];  Service: General;  Laterality: Right;  . PORTACATH PLACEMENT Right 09/23/2014  . PORTACATH PLACEMENT Right 09/23/2014   Procedure: INSERTION PORT-A-CATH RIGHT SUBCLAVIAN;  Surgeon: Fanny Skates, MD;  Location: [facility];  Service: General;  Laterality: Right;  . POSTERIOR LUMBAR FUSION  02/1988   L2 reconstruction; fusion  . SIMPLE MASTECTOMY WITH AXILLARY SENTINEL NODE BIOPSY Left 09/23/2014   Procedure: LEFT TOTAL MASTECTOMY WITH LEFT AXILLARY SENTINEL LYMPH NODE BIOPSY;  Surgeon: Fanny Skates, MD;  Location: [facility];  Service: General;  Laterality: Left;  . TOTAL ABDOMINAL HYSTERECTOMY  12/1976  . TOTAL KNEE ARTHROPLASTY Right 05/24/2009    Dr. Sharol Given  . UPPER GASTROINTESTINAL ENDOSCOPY    . WISDOM TOOTH EXTRACTION     Social History   Occupational History  . disability     [employer]   Social History Main Topics  .  Smoking status: Former Smoker    Packs/day: 0.25    Years: 20.00    Types: Cigarettes    Quit date: 05/22/1995  . Smokeless tobacco: Never Used  . Alcohol use No  . Drug use: No  . Sexual activity: Yes    Partners: Male    Birth control/ protection: None     Comment: menarche age 57, P 0 ,HRT x 42 yrs

## 2017-01-20 ENCOUNTER — Ambulatory Visit (INDEPENDENT_AMBULATORY_CARE_PROVIDER_SITE_OTHER): Payer: Medicare Other | Admitting: Orthopedic Surgery

## 2017-01-28 ENCOUNTER — Other Ambulatory Visit: Payer: Self-pay | Admitting: Physician Assistant

## 2017-01-28 DIAGNOSIS — G4700 Insomnia, unspecified: Secondary | ICD-10-CM

## 2017-01-29 ENCOUNTER — Ambulatory Visit (INDEPENDENT_AMBULATORY_CARE_PROVIDER_SITE_OTHER): Payer: BLUE CROSS/BLUE SHIELD

## 2017-01-29 ENCOUNTER — Encounter (INDEPENDENT_AMBULATORY_CARE_PROVIDER_SITE_OTHER): Payer: Self-pay | Admitting: Orthopedic Surgery

## 2017-01-29 ENCOUNTER — Ambulatory Visit (INDEPENDENT_AMBULATORY_CARE_PROVIDER_SITE_OTHER): Payer: BLUE CROSS/BLUE SHIELD | Admitting: Orthopedic Surgery

## 2017-01-29 ENCOUNTER — Other Ambulatory Visit: Payer: Self-pay | Admitting: Physician Assistant

## 2017-01-29 VITALS — Ht 64.76 in | Wt 158.0 lb

## 2017-01-29 DIAGNOSIS — M25561 Pain in right knee: Secondary | ICD-10-CM | POA: Diagnosis not present

## 2017-01-29 DIAGNOSIS — G4700 Insomnia, unspecified: Secondary | ICD-10-CM

## 2017-01-29 MED ORDER — ZOLPIDEM TARTRATE 10 MG PO TABS: 10.0000 mg | ORAL_TABLET | Freq: Every day | ORAL | 0 refills | Status: DC

## 2017-01-29 NOTE — Progress Notes (Signed)
Office Visit Note   Patient: Amy [name]           Date of Birth: 06-09-1951           MRN: [id number] Visit Date: 01/29/2017              Requested by: [name], PA-C [address] [place], [place] [address] PCP: [name], PA-C  Chief Complaint  Patient presents with  . Right Knee - Pain      HPI: Patient is a 66 year old woman who is 5 years status post reconstruction patellar tendon rupture and status post total knee arthroplasty. Patient has not been seen for the past 5 years she has been diagnosed with a brain tumor which has been treated. Patient has had involvement with her cranial nerves and since treatment for the brain tumor she is been having increasing pain and weakness in her right leg.  Assessment & Plan: Visit Diagnoses:  1. Acute pain of right knee     Plan: Patient was given a prescription for bench benchmark therapy for quad and hamstring strengthening she will then go to a gym to continue with the strengthening. This should resolve with nonoperative treatment.  Follow-Up Instructions: Return if symptoms worsen or fail to improve.   Ortho Exam  Patient is alert, oriented, no adenopathy, well-dressed, normal affect, normal respiratory effort. Examination patient has a normal gait. She has full active extension of the right knee despite the superior migration of the patella. She has flexion to 120. Varus and valgus stress is stable. 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  Imaging: Xr Knee 1-2 Views Right  Result Date: 01/29/2017 Two-view radiographs of the right knee shows stable alignment of the total knee arthroplasty there has been superior migration of the patella.   Labs: Lab Results  Component Value Date   HGBA1C 5.5 08/16/2014   HGBA1C 5.3 01/06/2014    Orders:  Orders Placed This Encounter  Procedures  . XR Knee 1-2 Views Right   No orders of  the defined types were placed in this encounter.    Procedures: No procedures performed  Clinical Data: No additional findings.  ROS:  All other systems negative, except as noted in the HPI. Review of Systems  Objective: Vital Signs: Ht 5' 4.76" (1.645 m)   Wt 158 lb (71.7 kg)   BMI 26.49 kg/m   Specialty Comments:  No specialty comments available.  PMFS History: Patient Active Problem List   Diagnosis Date Noted  . Pain in right hip 09/24/2016  . Trochanteric bursitis, right hip 09/24/2016  . Myofascitis 09/24/2016  . Post laminectomy syndrome 09/24/2016  . Chronic pain syndrome 09/24/2016  . Spondylosis without myelopathy or radiculopathy, lumbar region 07/30/2016  . Anxiety and depression 01/02/2016  . Hx of breast reconstruction 09/27/2015  . Migraine aura, persistent 09/22/2015  . Breast cancer of upper-outer quadrant of left female breast ([facility]) 09/09/2014  . HTN (hypertension) 12/24/2012  . Migraine 12/24/2012  . GERD (gastroesophageal reflux disease) 12/24/2012  . Hearing loss in left ear 12/24/2012  . 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 Insomnia 12/24/2012  . Diverticulosis 12/24/2012  . Personal history of colonic polyps 12/24/2012  . Hyperlipidemia 12/24/2012  . BCC (basal cell carcinoma), arm 12/24/2012   Past Medical History:  Diagnosis Date  . Allergy   . 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  Anxiety    "sometimes" (09/23/2014)  . Cancer of left breast ([facility]) 08/2014   left upper outer  . Cerebral meningioma ([facility]) 09/2012   auditory; s/p excision  . Chronic lower back pain   . Chronic pain in left foot   . Deafness in left ear   . Facial droop    "left side; from brain tumor OR"  . GERD (gastroesophageal reflux disease)   . History of chemotherapy    last treatment 03/02/2015  . History of hiatal hernia   . Hypertension   . Migraine    "before brain tumor was removed" (09/23/2014)  . Personal history of chemotherapy 09/12/2014   . PONV (postoperative nausea and vomiting)    "severe"  . Stress incontinence     Family History  Problem Relation Age of Onset  . Adopted: Yes  . Hypertension Father     Past Surgical History:  Procedure Laterality Date  . AUGMENTATION MAMMAPLASTY Bilateral 11/11/2015  . BACK SURGERY    . BRAIN SURGERY  2014   cerebellopontine angle tumor with craniotomy   . BREAST BIOPSY Left 08/2014  . BREAST RECONSTRUCTION Left 05/18/2015  . BREAST RECONSTRUCTION WITH PLACEMENT OF TISSUE EXPANDER AND FLEX HD (ACELLULAR HYDRATED DERMIS) Left 05/18/2015   Procedure: LEFT BREAST RECONSTRUCTION WITH PLACEMENT OF TISSUE EXPANDER AND ACELLULAR HYDRATED DERMIS MATRIX;  Surgeon: [name], MD;  Location: [facility];  Service: Plastics;  Laterality: Left;  . COLONOSCOPY    . FOOT FRACTURE SURGERY Left 1990's  . FOOT SURGERY Left    "one the muscle; trying to roll foot inward after failed recovery S/P fracture"  . FRACTURE SURGERY    . JOINT REPLACEMENT    . KNEE ARTHROSCOPY Left   . MASTECTOMY COMPLETE / SIMPLE W/ SENTINEL NODE BIOPSY Left 09/23/2014  . MULTIPLE TOOTH EXTRACTIONS    . PARTIAL KNEE ARTHROPLASTY Left 2000's    Dr. Sharol Given  . PATELLAR TENDON REPAIR  05/27/2012   Procedure: PATELLA TENDON REPAIR;  Surgeon: Newt Minion, MD;  Location: [facility];  Service: Orthopedics;  Laterality: Right;  Right Patella Tendon  Reconstruction with Semi Tendonosum  . PORT-A-CATH REMOVAL Right 03/16/2015   Procedure: REMOVAL PORT-A-CATH;  Surgeon: Fanny Skates, MD;  Location: [facility];  Service: General;  Laterality: Right;  . PORTACATH PLACEMENT Right 09/23/2014  . PORTACATH PLACEMENT Right 09/23/2014   Procedure: INSERTION PORT-A-CATH RIGHT SUBCLAVIAN;  Surgeon: Fanny Skates, MD;  Location: [facility];  Service: General;  Laterality: Right;  . POSTERIOR LUMBAR FUSION  02/1988   L2 reconstruction; fusion  . SIMPLE MASTECTOMY WITH AXILLARY SENTINEL NODE BIOPSY Left 09/23/2014   Procedure: LEFT TOTAL MASTECTOMY WITH LEFT  AXILLARY SENTINEL LYMPH NODE BIOPSY;  Surgeon: Fanny Skates, MD;  Location: [facility];  Service: General;  Laterality: Left;  . TOTAL ABDOMINAL HYSTERECTOMY  12/1976  . TOTAL KNEE ARTHROPLASTY Right 05/24/2009    Dr. Sharol Given  . UPPER GASTROINTESTINAL ENDOSCOPY    . WISDOM TOOTH EXTRACTION     Social History   Occupational History  . disability     [employer]   Social History Main Topics  . Smoking status: Former Smoker    Packs/day: 0.25    Years: 20.00    Types: Cigarettes    Quit date: 05/22/1995  . Smokeless tobacco: Never Used  . Alcohol use No  . Drug use: No  . Sexual activity: Yes    Partners: Male    Birth control/ protection: None     Comment: menarche age  15, P 0 ,HRT x 42 yrs

## 2017-01-29 NOTE — Telephone Encounter (Addendum)
Meds ordered this encounter  Medications  . zolpidem (AMBIEN) 10 MG tablet    Sig: Take 1 tablet (10 mg total) by mouth at bedtime.    Dispense:  30 tablet    Refill:  0    Not to exceed 5 additional fills before 05/29/2017   Patient notified via My Chart.

## 2017-01-30 ENCOUNTER — Other Ambulatory Visit: Payer: Self-pay | Admitting: Physician Assistant

## 2017-01-30 DIAGNOSIS — F419 Anxiety disorder, unspecified: Secondary | ICD-10-CM

## 2017-01-30 DIAGNOSIS — F329 Major depressive disorder, single episode, unspecified: Secondary | ICD-10-CM

## 2017-01-30 MED ORDER — ALPRAZOLAM 0.25 MG PO TABS: 0.2500 mg | ORAL_TABLET | Freq: Two times a day (BID) | ORAL | 0 refills | Status: DC | PRN

## 2017-01-30 NOTE — Telephone Encounter (Signed)
Meds ordered this encounter  Medications  . ALPRAZolam (XANAX) 0.25 MG tablet    Sig: Take 1 tablet (0.25 mg total) by mouth 2 (two) times daily as needed.    Dispense:  60 tablet    Refill:  0    Not to exceed 5 additional fills before 06/04/2017

## 2017-01-30 NOTE — Telephone Encounter (Signed)
Prescription for Ambien faxed to [facility].

## 2017-01-30 NOTE — Telephone Encounter (Signed)
Script faxed to pharmacy

## 2017-01-31 NOTE — Telephone Encounter (Signed)
I authorized this yesterday, and patient notified by My Chart.

## 2017-02-02 ENCOUNTER — Other Ambulatory Visit: Payer: Self-pay | Admitting: Physician Assistant

## 2017-02-02 DIAGNOSIS — I10 Essential (primary) hypertension: Secondary | ICD-10-CM

## 2017-02-04 ENCOUNTER — Telehealth (INDEPENDENT_AMBULATORY_CARE_PROVIDER_SITE_OTHER): Payer: Self-pay

## 2017-02-04 NOTE — Telephone Encounter (Signed)
Patient needs aggressive quad and hamstring strengthening to improve the tracking of the patella and improve the stability of her knee. Once she has completed her physical therapy session she needs to continue strength training at a gym.

## 2017-02-04 NOTE — Telephone Encounter (Signed)
Please see message below and advise.

## 2017-02-04 NOTE — Telephone Encounter (Signed)
I called and advised of message below.  

## 2017-02-04 NOTE — Telephone Encounter (Signed)
Patient would like to know if therapy would bring her knee cap back into it's alignment and should she be wearing a knee brace.  CB# [phone].  Please advise.  Thank You.

## 2017-02-06 DIAGNOSIS — G5762 Lesion of plantar nerve, left lower limb: Secondary | ICD-10-CM | POA: Diagnosis not present

## 2017-02-21 ENCOUNTER — Ambulatory Visit (INDEPENDENT_AMBULATORY_CARE_PROVIDER_SITE_OTHER): Payer: Medicare Other | Admitting: Physician Assistant

## 2017-02-21 ENCOUNTER — Encounter: Payer: Self-pay | Admitting: Physician Assistant

## 2017-02-21 VITALS — BP 130/84 | HR 75 | Resp 18 | Ht 64.76 in | Wt 154.8 lb

## 2017-02-21 DIAGNOSIS — S83006A Unspecified dislocation of unspecified patella, initial encounter: Secondary | ICD-10-CM | POA: Insufficient documentation

## 2017-02-21 DIAGNOSIS — S83004A Unspecified dislocation of right patella, initial encounter: Secondary | ICD-10-CM | POA: Diagnosis not present

## 2017-02-21 DIAGNOSIS — I10 Essential (primary) hypertension: Secondary | ICD-10-CM | POA: Diagnosis not present

## 2017-02-21 MED ORDER — LISINOPRIL 10 MG PO TABS: 10.0000 mg | ORAL_TABLET | Freq: Every day | ORAL | 3 refills | Status: DC

## 2017-02-21 NOTE — Progress Notes (Signed)
Patient ID: Amy [name], female    DOB: [birth year], 66 y.o.   MRN: [id number]  PCP: [name], PA-C  Chief Complaint  Patient presents with  . Hypertension  . Follow-up    Subjective:   Presents for evaluation of HTN.  She has been in PT, due to dislocation of the RIGHT patella, and having more pain. Concerned that her BP may not be adequately controlled. No CP, SOB, HA, dizziness. She is feeling increased fatigue, which she relates to the verapamil. Sleepiness and fatigue are atypical for her, as she typically leads a very active lifestyle. Mood remains upbeat.   Review of Systems As above. Mild cough. Denies chest pain, shortness of breath, HA, dizziness, vision change, nausea, vomiting, diarrhea, constipation, melena, hematochezia, dysuria, increased urinary urgency or frequency, increased hunger or thirst, unintentional weight change, unexplained myalgias or arthralgias, rash.     Patient Active Problem List   Diagnosis Date Noted  . Dislocated patella 02/21/2017  . Trochanteric bursitis, right hip 09/24/2016  . Myofascitis 09/24/2016  . Post laminectomy syndrome 09/24/2016  . Chronic pain syndrome 09/24/2016  . Spondylosis without myelopathy or radiculopathy, lumbar region 07/30/2016  . Anxiety and depression 01/02/2016  . Hx of breast reconstruction 09/27/2015  . Breast cancer of upper-outer quadrant of left female breast ([facility]) 09/09/2014  . HTN (hypertension) 12/24/2012  . Migraine with aura 12/24/2012  . GERD (gastroesophageal reflux disease) 12/24/2012  . Hearing loss in left ear 12/24/2012  . 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 Insomnia 12/24/2012  . Diverticulosis 12/24/2012  . Personal history of colonic polyps 12/24/2012  . Hyperlipidemia 12/24/2012  . BCC (basal cell carcinoma), arm 12/24/2012     Prior to Admission medications   Medication Sig Start Date End Date Taking? Authorizing Provider  ALPRAZolam (XANAX) 0.25 MG tablet Take 1  tablet (0.25 mg total) by mouth 2 (two) times daily as needed. 01/30/17  Yes Hiroko Tregre, PA-C  citalopram (CELEXA) 40 MG tablet TAKE 1 TABLET BY MOUTH EVERY DAY 01/14/17  Yes Benay Pomeroy, PA-C  HYDROcodone-acetaminophen (NORCO/VICODIN) 5-325 MG tablet  09/06/15  Yes [provider]  lansoprazole (PREVACID) 15 MG capsule Take 30 mg by mouth daily at 12 noon. Reported on 01/02/2016   Yes [provider]  metoprolol succinate (TOPROL-XL) 100 MG 24 hr tablet TAKE 1 TABLET (100 MG TOTAL) BY MOUTH DAILY. TAKE WITH OR IMMEDIATELY FOLLOWING A MEAL. 02/02/17  Yes Kaven Cumbie, PA-C  verapamil (CALAN-SR) 240 MG CR tablet Take 1 tablet (240 mg total) by mouth at bedtime. 01/17/17  Yes Glenville Espina, PA-C  zolpidem (AMBIEN) 10 MG tablet Take 1 tablet (10 mg total) by mouth at bedtime. 01/29/17  Yes [name], PA-C     Allergies  Allergen Reactions  . Codeine Nausea And Vomiting  . Darvocet [Propoxyphene N-Acetaminophen] Other (See Comments)    Pruritis, rapid heart rate  . Tramadol Other (See Comments)    Chest tightness  . Gold-Containing Drug Products Swelling  . Meperidine Nausea And Vomiting  . Morphine Nausea And Vomiting  . Sulfa Antibiotics Rash       Objective:  Physical Exam  Constitutional: She is oriented to person, place, and time. She appears well-developed and well-nourished. She is active and cooperative. No distress.  BP 130/84   Pulse 75   Resp 18   Ht 5' 4.76" (1.645 m)   Wt 154 lb 12.8 oz (70.2 kg)   SpO2 95%   BMI 25.95 kg/m   HENT:  Head: Normocephalic and atraumatic.  Right Ear: Hearing normal.  Left Ear: Hearing normal.  Eyes: Conjunctivae are normal. No scleral icterus.  Neck: Normal range of motion. Neck supple. No thyromegaly present.  Cardiovascular: Normal rate, regular rhythm and normal heart sounds.   Pulses:      Radial pulses are 2+ on the right side, and 2+ on the left side.  Pulmonary/Chest: Effort normal and breath sounds  normal.  Lymphadenopathy:       Head (right side): No tonsillar, no preauricular, no posterior auricular and no occipital adenopathy present.       Head (left side): No tonsillar, no preauricular, no posterior auricular and no occipital adenopathy present.    She has no cervical adenopathy.       Right: No supraclavicular adenopathy present.       Left: No supraclavicular adenopathy present.  Neurological: She is alert and oriented to person, place, and time. No sensory deficit.  Skin: Skin is warm, dry and intact. No rash noted. No cyanosis or erythema. Nails show no clubbing.  Psychiatric: She has a normal mood and affect. Her speech is normal and behavior is normal.        Assessment & Plan:   Problem List Items Addressed This Visit    HTN (hypertension) - Primary    Relates fatigue to starting and increased verapamil dose. Stop verapamil. Restart lisinopril.      Relevant Medications   lisinopril (PRINIVIL,ZESTRIL) 10 MG tablet   Dislocated patella    Continue follow-up per orthopedics.          Return in about 6 weeks (around 04/04/2017) for re-evaluation of blood pressure and cholesterol.   Fara Chute, PA-C [facility]

## 2017-02-21 NOTE — Assessment & Plan Note (Signed)
Continue follow-up per orthopedics.

## 2017-02-21 NOTE — Patient Instructions (Signed)
     IF you received an x-ray today, you will receive an invoice from [facility]. Please contact [facility] at [phone] with questions or concerns regarding your invoice.   IF you received labwork today, you will receive an invoice from [facility]. Please contact [facility] at [phone] with questions or concerns regarding your invoice.   Our billing staff will not be able to assist you with questions regarding bills from these companies.  You will be contacted with the lab results as soon as they are available. The fastest way to get your results is to activate your My Chart account. Instructions are located on the last page of this paperwork. If you have not heard from us regarding the results in 2 weeks, please contact this office.     

## 2017-02-21 NOTE — Progress Notes (Signed)
Subjective:    Patient ID: Amy [name], female    DOB: 04/14/1951, 66 y.o.   MRN: [id number] PCP: [name], PA-C   HPI Amy [name] is a 66 year old female presenting for re-evaluation of hypertension and hyperlipidemia.  She is currently in pain, about 4-5/10, after physical therapy for her migrating patella. She is seeing [facility]. She enjoys it. She feels that her blood pressure medications are making her sleeping. Does not take blood pressure readings at home. No chest pain, SOB, dizziness, headaches, vision changes.  Mood is stable. She is feeling much sleepier and feels it happened after starting the verapamil. She doesn't like it. "It's not my nature.  Diet: Eats dairy, vegetables, toast, water, juice. Exercise: Active with her dogs and gardening.  She has a bit of a cough. No other concerns today.  Patient Active Problem List   Diagnosis Date Noted  . Trochanteric bursitis, right hip 09/24/2016  . Myofascitis 09/24/2016  . Post laminectomy syndrome 09/24/2016  . Chronic pain syndrome 09/24/2016  . Spondylosis without myelopathy or radiculopathy, lumbar region 07/30/2016  . Anxiety and depression 01/02/2016  . Hx of breast reconstruction 09/27/2015  . Breast cancer of upper-outer quadrant of left female breast ([place]) 09/09/2014  . HTN (hypertension) 12/24/2012  . Migraine with aura 12/24/2012  . GERD (gastroesophageal reflux disease) 12/24/2012  . Hearing loss in left ear 12/24/2012  . 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 Insomnia 12/24/2012  . Diverticulosis 12/24/2012  . Personal history of colonic polyps 12/24/2012  . Hyperlipidemia 12/24/2012  . BCC (basal cell carcinoma), arm 12/24/2012   Prior to Admission medications   Medication Sig Start Date End Date Taking? Authorizing Provider  ALPRAZolam (XANAX) 0.25 MG tablet Take 1 tablet (0.25 mg total) by mouth 2 (two) times daily as needed. 01/30/17  Yes  Jeffery, Chelle, PA-C  citalopram (CELEXA) 40 MG tablet TAKE 1 TABLET BY MOUTH EVERY DAY 01/14/17  Yes Jeffery, Chelle, PA-C  HYDROcodone-acetaminophen (NORCO/VICODIN) 5-325 MG tablet  09/06/15  Yes [provider]  lansoprazole (PREVACID) 15 MG capsule Take 30 mg by mouth daily at 12 noon. Reported on 01/02/2016   Yes [provider]  metoprolol succinate (TOPROL-XL) 100 MG 24 hr tablet TAKE 1 TABLET (100 MG TOTAL) BY MOUTH DAILY. TAKE WITH OR IMMEDIATELY FOLLOWING A MEAL. 02/02/17  Yes Jeffery, Chelle, PA-C  verapamil (CALAN-SR) 240 MG CR tablet Take 1 tablet (240 mg total) by mouth at bedtime. 01/17/17  Yes Jeffery, Chelle, PA-C  zolpidem (AMBIEN) 10 MG tablet Take 1 tablet (10 mg total) by mouth at bedtime. 01/29/17  Yes [name], PA-C    Allergies  Allergen Reactions  . Codeine Nausea And Vomiting  . Darvocet [Propoxyphene N-Acetaminophen] Other (See Comments)    Pruritis, rapid heart rate  . Tramadol Other (See Comments)    Chest tightness  . Gold-Containing Drug Products Swelling  . Meperidine Nausea And Vomiting  . Morphine Nausea And Vomiting  . Sulfa Antibiotics Rash    Review of Systems See above.    Objective:   Physical Exam  Constitutional: She appears well-developed and well-nourished.  BP (!) 145/90   Pulse 75   Resp 18   Ht 5' 4.76" (1.645 m)   Wt 154 lb 12.8 oz (70.2 kg)   SpO2 95%   BMI 25.95 kg/m    HENT:  Head: Normocephalic and atraumatic.  Right Ear: External ear normal.  Left Ear: External ear normal.  Nose: Nose  normal.  Mouth/Throat: Oropharynx is clear and moist.  Eyes: EOM are normal.  Neck: Normal range of motion. Neck supple.  Cardiovascular: Normal rate, regular rhythm, normal heart sounds and intact distal pulses.   Pulses:      Radial pulses are 2+ on the right side, and 2+ on the left side.  Pulmonary/Chest: Effort normal and breath sounds normal.  Lymphadenopathy:    She has no cervical adenopathy.  Neurological:  She is alert.  Skin: Skin is warm and dry.  Psychiatric: She has a normal mood and affect. Her behavior is normal.       Assessment & Plan:   1. Essential hypertension Discontinue verapamil. Start lisinopril. - lisinopril (PRINIVIL,ZESTRIL) 10 MG tablet; Take 1 tablet (10 mg total) by mouth daily.  Dispense: 90 tablet; Refill: 3  2. Dislocation of right patella, initial encounter Continue follow-up with orthopedics and physical therapy.    Respectfully, Weldon Picking, PA-S

## 2017-02-21 NOTE — Assessment & Plan Note (Signed)
Relates fatigue to starting and increased verapamil dose. Stop verapamil. Restart lisinopril.

## 2017-02-24 DIAGNOSIS — Z23 Encounter for immunization: Secondary | ICD-10-CM | POA: Diagnosis not present

## 2017-02-26 ENCOUNTER — Other Ambulatory Visit: Payer: Self-pay | Admitting: Physician Assistant

## 2017-02-26 DIAGNOSIS — G4700 Insomnia, unspecified: Secondary | ICD-10-CM

## 2017-02-28 ENCOUNTER — Encounter: Payer: Self-pay | Admitting: Physician Assistant

## 2017-02-28 ENCOUNTER — Other Ambulatory Visit: Payer: Self-pay | Admitting: Physician Assistant

## 2017-02-28 DIAGNOSIS — G4700 Insomnia, unspecified: Secondary | ICD-10-CM

## 2017-03-01 ENCOUNTER — Other Ambulatory Visit: Payer: Self-pay | Admitting: Physician Assistant

## 2017-03-01 ENCOUNTER — Telehealth: Payer: Self-pay

## 2017-03-01 DIAGNOSIS — G4700 Insomnia, unspecified: Secondary | ICD-10-CM

## 2017-03-01 MED ORDER — ZOLPIDEM TARTRATE 10 MG PO TABS: 10.0000 mg | ORAL_TABLET | Freq: Every day | ORAL | 0 refills | Status: DC

## 2017-03-01 NOTE — Telephone Encounter (Signed)
Patient notified via My Chart.  Please call to her pharmacy.  Meds ordered this encounter  Medications  . zolpidem (AMBIEN) 10 MG tablet    Sig: Take 1 tablet (10 mg total) by mouth at bedtime.    Dispense:  30 tablet    Refill:  0    Not to exceed 5 additional fills before 05/29/2017    Order Specific Question:   Supervising Provider    Answer:   Shawnee Knapp [[id number]]

## 2017-03-01 NOTE — Telephone Encounter (Signed)
Called in Rx for ambien to pharmacy. Pt aware.

## 2017-03-04 ENCOUNTER — Telehealth (INDEPENDENT_AMBULATORY_CARE_PROVIDER_SITE_OTHER): Payer: Self-pay | Admitting: Orthopedic Surgery

## 2017-03-04 NOTE — Telephone Encounter (Signed)
Patient's husband (Rob) called advised patient need the muscle relaxer (Methocarbamol) The number to contact (Rob) is [phone]. The number to contact patient is [phone]

## 2017-03-04 NOTE — Telephone Encounter (Signed)
Patient was seen on in our office on 01/29/2017.

## 2017-03-04 NOTE — Telephone Encounter (Signed)
Call patient and see if she can come in for an evaluation. I am not quite sure what she needs the muscle relaxant 4.

## 2017-03-05 ENCOUNTER — Telehealth (INDEPENDENT_AMBULATORY_CARE_PROVIDER_SITE_OTHER): Payer: Self-pay | Admitting: Orthopedic Surgery

## 2017-03-05 NOTE — Telephone Encounter (Signed)
I called and left voicemail to advise Dr. Sharol Given would like to see her back in the office if she is symptomatic.

## 2017-03-05 NOTE — Telephone Encounter (Signed)
Spoke with patient advised  her is scheduled to see Dr Sharol Given 03/11/17. Patient said she saw Dr Sharol Given 01/29/17. The number to contact patient is [phone]

## 2017-03-11 ENCOUNTER — Ambulatory Visit (INDEPENDENT_AMBULATORY_CARE_PROVIDER_SITE_OTHER): Payer: BLUE CROSS/BLUE SHIELD | Admitting: Orthopedic Surgery

## 2017-03-11 ENCOUNTER — Encounter (INDEPENDENT_AMBULATORY_CARE_PROVIDER_SITE_OTHER): Payer: Self-pay | Admitting: Orthopedic Surgery

## 2017-03-11 VITALS — Ht 64.0 in | Wt 154.0 lb

## 2017-03-11 DIAGNOSIS — M25561 Pain in right knee: Secondary | ICD-10-CM

## 2017-03-11 MED ORDER — METHOCARBAMOL 500 MG PO TABS: 500.0000 mg | ORAL_TABLET | Freq: Three times a day (TID) | ORAL | 0 refills | Status: DC | PRN

## 2017-03-11 NOTE — Progress Notes (Signed)
Office Visit Note   Patient: Amy [name]           Date of Birth: 10-Dec-1950           MRN: [id number] Visit Date: 03/11/2017              Requested by: [name], PA-C [address] [place], [place] [address] PCP: [name], PA-C  Chief Complaint  Patient presents with  . 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 Patient has difficulty sleeping she is going to physical therapy which causes discomfort. She also is most recent been treated for breast cancer as well as a recent stroke. She states that the Robaxin is the only medication that helps her with the leg pain.  Assessment & Plan: Visit Diagnoses:  1. Acute pain of right knee     Plan: Prescription for Robaxin was provided she will continue with physical therapy discussed the importance of quad strengthening.  Follow-Up Instructions: Return if symptoms worsen or fail to improve.   Ortho Exam  Patient is alert, oriented, no adenopathy, well-dressed, normal affect, normal respiratory effort. On examination patient has difficulty getting from sitting to a standing position. She has an antalgic gait she does have significant quad atonia and atrophy of the right thigh. She has full extension and flexion of the knee E collateral ligaments are stable. There is no redness no cellulitis no effusion no signs of infection.  Imaging: No results found.  Labs: Lab Results  Component Value Date   HGBA1C 5.5 08/16/2014   HGBA1C 5.3 01/06/2014    Orders:  No orders of the defined types were placed in this encounter.  Meds ordered this encounter  Medications  . methocarbamol (ROBAXIN) 500 MG tablet    Sig: Take 1 tablet (500 mg total) by mouth every 8 (eight) hours as needed for muscle spasms.    Dispense:  60 tablet    Refill:  0     Procedures: No procedures performed  Clinical Data: No  additional findings.  ROS:  All other systems negative, except as noted in the HPI. Review of Systems  Objective: Vital Signs: Ht 5\' 4"  (1.626 m)   Wt 154 lb (69.9 kg)   BMI 26.43 kg/m   Specialty Comments:  No specialty comments available.  PMFS History: Patient Active Problem List   Diagnosis Date Noted  . Dislocated patella 02/21/2017  . Trochanteric bursitis, right hip 09/24/2016  . Myofascitis 09/24/2016  . Post laminectomy syndrome 09/24/2016  . Chronic pain syndrome 09/24/2016  . Spondylosis without myelopathy or radiculopathy, lumbar region 07/30/2016  . Anxiety and depression 01/02/2016  . Hx of breast reconstruction 09/27/2015  . Breast cancer of upper-outer quadrant of left female breast ([place]) 09/09/2014  . HTN (hypertension) 12/24/2012  . Migraine with aura 12/24/2012  . GERD (gastroesophageal reflux disease) 12/24/2012  . Hearing loss in left ear 12/24/2012  . 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 Insomnia 12/24/2012  . Diverticulosis 12/24/2012  . Personal history of colonic polyps 12/24/2012  . Hyperlipidemia 12/24/2012  . BCC (basal cell carcinoma), arm 12/24/2012   Past Medical History:  Diagnosis Date  . Allergy   . 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 Anxiety    "sometimes" (09/23/2014)  . Cancer of left breast ([facility]) 08/2014   left upper outer  . 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 Chronic lower back pain   . Chronic pain in left foot   . Deafness in left ear   . Facial droop    "left side; from brain tumor OR"  . GERD (gastroesophageal reflux disease)   . History of chemotherapy    last treatment 03/02/2015  . History of hiatal hernia   . Hypertension   . Migraine    "before brain tumor was removed" (09/23/2014)  . Personal history of chemotherapy 09/12/2014  . PONV (postoperative nausea and vomiting)    "severe"  . Stress incontinence     Family History  Problem Relation Age of Onset  .  Adopted: Yes  . Hypertension Father     Past Surgical History:  Procedure Laterality Date  . AUGMENTATION MAMMAPLASTY Bilateral 11/11/2015  . BACK SURGERY    . BRAIN SURGERY  2014   cerebellopontine angle tumor with craniotomy   . BREAST BIOPSY Left 08/2014  . BREAST RECONSTRUCTION Left 05/18/2015  . BREAST RECONSTRUCTION WITH PLACEMENT OF TISSUE EXPANDER AND FLEX HD (ACELLULAR HYDRATED DERMIS) Left 05/18/2015   Procedure: LEFT BREAST RECONSTRUCTION WITH PLACEMENT OF TISSUE EXPANDER AND ACELLULAR HYDRATED DERMIS MATRIX;  Surgeon: [name], MD;  Location: [facility];  Service: Plastics;  Laterality: Left;  . COLONOSCOPY    . FOOT FRACTURE SURGERY Left 1990's  . FOOT SURGERY Left    "one the muscle; trying to roll foot inward after failed recovery S/P fracture"  . FRACTURE SURGERY    . JOINT REPLACEMENT    . KNEE ARTHROSCOPY Left   . MASTECTOMY COMPLETE / SIMPLE W/ SENTINEL NODE BIOPSY Left 09/23/2014  . MULTIPLE TOOTH EXTRACTIONS    . PARTIAL KNEE ARTHROPLASTY Left 2000's    Dr. Sharol Given  . PATELLAR TENDON REPAIR  05/27/2012   Procedure: PATELLA TENDON REPAIR;  Surgeon: Newt Minion, MD;  Location: [facility];  Service: Orthopedics;  Laterality: Right;  Right Patella Tendon  Reconstruction with Semi Tendonosum  . PORT-A-CATH REMOVAL Right 03/16/2015   Procedure: REMOVAL PORT-A-CATH;  Surgeon: Fanny Skates, MD;  Location: [facility];  Service: General;  Laterality: Right;  . PORTACATH PLACEMENT Right 09/23/2014  . PORTACATH PLACEMENT Right 09/23/2014   Procedure: INSERTION PORT-A-CATH RIGHT SUBCLAVIAN;  Surgeon: Fanny Skates, MD;  Location: [facility];  Service: General;  Laterality: Right;  . POSTERIOR LUMBAR FUSION  02/1988   L2 reconstruction; fusion  . SIMPLE MASTECTOMY WITH AXILLARY SENTINEL NODE BIOPSY Left 09/23/2014   Procedure: LEFT TOTAL MASTECTOMY WITH LEFT AXILLARY SENTINEL LYMPH NODE BIOPSY;  Surgeon: Fanny Skates, MD;  Location: [facility];  Service: General;  Laterality: Left;  . TOTAL ABDOMINAL  HYSTERECTOMY  12/1976  . TOTAL KNEE ARTHROPLASTY Right 05/24/2009    Dr. Sharol Given  . UPPER GASTROINTESTINAL ENDOSCOPY    . WISDOM TOOTH EXTRACTION     Social History   Occupational History  . disability     [employer]   Social History Main Topics  . Smoking status: Former Smoker    Packs/day: 0.25    Years: 20.00    Types: Cigarettes    Quit date: 05/22/1995  . Smokeless tobacco: Never Used  . Alcohol use No  . Drug use: No  . Sexual activity: Yes    Partners: Male    Birth control/ protection: None     Comment: menarche age 58, P 0 ,HRT x 42 yrs

## 2017-03-26 ENCOUNTER — Other Ambulatory Visit: Payer: Self-pay | Admitting: Physician Assistant

## 2017-03-26 DIAGNOSIS — G4700 Insomnia, unspecified: Secondary | ICD-10-CM

## 2017-03-26 DIAGNOSIS — F329 Major depressive disorder, single episode, unspecified: Secondary | ICD-10-CM

## 2017-03-26 DIAGNOSIS — F419 Anxiety disorder, unspecified: Secondary | ICD-10-CM

## 2017-03-27 MED ORDER — ALPRAZOLAM 0.25 MG PO TABS: 0.2500 mg | ORAL_TABLET | Freq: Two times a day (BID) | ORAL | 0 refills | Status: DC | PRN

## 2017-03-27 MED ORDER — ZOLPIDEM TARTRATE 10 MG PO TABS: 10.0000 mg | ORAL_TABLET | Freq: Every day | ORAL | 0 refills | Status: DC

## 2017-03-27 NOTE — Telephone Encounter (Signed)
Done

## 2017-03-27 NOTE — Telephone Encounter (Signed)
Spoke with pt - advised prescriptions at front desk 102 for pick up.

## 2017-04-03 ENCOUNTER — Telehealth (INDEPENDENT_AMBULATORY_CARE_PROVIDER_SITE_OTHER): Payer: Self-pay | Admitting: Physical Medicine and Rehabilitation

## 2017-04-03 NOTE — Telephone Encounter (Signed)
Ok

## 2017-04-03 NOTE — Telephone Encounter (Signed)
Scheduled for 04/07/17 at 1300.

## 2017-04-07 ENCOUNTER — Ambulatory Visit (INDEPENDENT_AMBULATORY_CARE_PROVIDER_SITE_OTHER): Payer: BLUE CROSS/BLUE SHIELD | Admitting: Physical Medicine and Rehabilitation

## 2017-04-07 ENCOUNTER — Ambulatory Visit (INDEPENDENT_AMBULATORY_CARE_PROVIDER_SITE_OTHER): Payer: Medicare Other

## 2017-04-07 ENCOUNTER — Encounter (INDEPENDENT_AMBULATORY_CARE_PROVIDER_SITE_OTHER): Payer: Self-pay | Admitting: Physical Medicine and Rehabilitation

## 2017-04-07 DIAGNOSIS — G894 Chronic pain syndrome: Secondary | ICD-10-CM

## 2017-04-07 DIAGNOSIS — M609 Myositis, unspecified: Secondary | ICD-10-CM

## 2017-04-07 DIAGNOSIS — M47816 Spondylosis without myelopathy or radiculopathy, lumbar region: Secondary | ICD-10-CM | POA: Diagnosis not present

## 2017-04-07 DIAGNOSIS — M7061 Trochanteric bursitis, right hip: Secondary | ICD-10-CM | POA: Diagnosis not present

## 2017-04-07 NOTE — Progress Notes (Signed)
Amy [name] - 66 y.o. female MRN [id number]  Date of birth: 04-09-1951  Office Visit Note: Visit Date: 04/07/2017 PCP: [name], PA-C Referred by: [name], PA-C  Subjective: Chief Complaint  Patient presents with  . Right Hip - Pain   HPI: Amy [name] is a very pleasant 66 year old female that I have known over the years. She has an extensive medical history with prior longstem lumbar fusion due to traumatic injury. She has survived breast cancer with double mastectomy and she has had resection of meningioma with complications of cerebrovascular incident. I saw her in June and we completed a trochanteric bursitis injection on the right hip with fluoroscopic guidance and she did quite well. She had prior injections without fluoroscopic guidance with no relief. She comes in today with worsening symptoms again on the right lateral hip and trochanter. She denies any specific groin pain. She has no numbness tingling or paresthesias. She continues to have some low back pain which is probably facet mediated pain below her fusion. She has been going to physical therapy but the right lateral pain has returned. She's had no focal weakness. She spelled conservative care including physical therapy and medication management.    Review of Systems  Constitutional: Negative for chills, fever, malaise/fatigue and weight loss.  HENT: Negative for hearing loss and sinus pain.   Eyes: Negative for blurred vision, double vision and photophobia.  Respiratory: Negative for cough and shortness of breath.   Cardiovascular: Negative for chest pain, palpitations and leg swelling.  Gastrointestinal: Negative for abdominal pain, nausea and vomiting.  Genitourinary: Negative for flank pain.  Musculoskeletal: Positive for back pain and joint pain. Negative for myalgias.  Skin: Negative for itching and rash.  Neurological: Negative for tremors, focal weakness and weakness.    Endo/Heme/Allergies: Negative.   Psychiatric/Behavioral: Negative for depression.  All other systems reviewed and are negative.  Otherwise per HPI.  Assessment & Plan: Visit Diagnoses:  1. Greater trochanteric bursitis, right   2. Spondylosis without myelopathy or radiculopathy, lumbar region   3. Chronic pain syndrome   4. Myofascitis     Plan: Findings:  Right lateral hip pain consistent with greater trochanteric bursitis which was relieved in June with the injection with fluoroscopic guidance. She has been in physical therapy as well and this seems to just really flared up. She has no radicular pattern although I can't rule out a radiculitis. He has extensive longstem fusion from traumatic spine. At this point I think it would be wise to repeat the fluoroscopically guided greater trochanteric injection and she'll continue with therapy exercises. We'll discuss her low back pain at length and is probably going to be time at some point to repeat the facet joint blocks. Could consider radiofrequency ablation although she has done really quite well with intermittent facet joint injections.    Meds & Orders: No orders of the defined types were placed in this encounter.   Orders Placed This Encounter  Procedures  . Large Joint Injection/Arthrocentesis  . XR C-ARM NO REPORT    Follow-up: Return if symptoms worsen or fail to improve.   Procedures: Greater trochanteric bursa injection with fluoroscopic guidance Date/Time: 04/07/2017 1:19 PM Performed by: Magnus Sinning Authorized by: Magnus Sinning   Consent Given by:  Parent Site marked: the procedure site was marked   Timeout: prior to procedure the correct patient, procedure, and site was verified   Indications:  Pain and diagnostic evaluation Location:  Hip Site:  R greater trochanter  Prep: patient was prepped and draped in usual sterile fashion   Needle Size:  22 G Needle Length:  3.5 inches Approach:  Lateral Ultrasound  Guidance: No   Fluoroscopic Guidance: Yes   Arthrogram: No   Medications:  4 mL bupivacaine 0.25 %; 4 mL lidocaine 2 %; 80 mg triamcinolone acetonide 40 MG/ML Aspiration Attempted: No   Patient tolerance:  Patient tolerated the procedure well with no immediate complications  There was excellent flow of contrast outlined the greater trochanteric bursa without vascular uptake.     No notes on file   Clinical History: No specialty comments available.  She reports that she quit smoking about 21 years ago. Her smoking use included Cigarettes. She has a 5.00 pack-year smoking history. She has never used smokeless tobacco. No results for input(s): HGBA1C, LABURIC in the last 8760 hours.  Objective:  VS:  HT:    WT:   BMI:     BP:   HR: bpm  TEMP: ( )  RESP:  Physical Exam  Constitutional: She is oriented to person, place, and time. She appears well-developed and well-nourished.  HENT:  Mild facial droop  Eyes: Conjunctivae are normal.  She does have some disconjugate gaze  Cardiovascular: Normal rate and intact distal pulses.   Pulmonary/Chest: Effort normal.  Musculoskeletal:  Patient ambulates without aid. She is somewhat slow to rise from a seated position. She does have pain exquisitely over the right greater trochanter and on the left. She has no pain with hip rotation she has good distal strength. She has no clonus.  Neurological: She is alert and oriented to person, place, and time. She exhibits normal muscle tone.  Skin: Skin is warm and dry. No rash noted. No erythema.  Psychiatric: She has a normal mood and affect. Her behavior is normal.  Nursing note and vitals reviewed.   Ortho Exam Imaging: No results found.  Past Medical/Family/Surgical/Social History: Medications & Allergies reviewed per EMR Patient Active Problem List   Diagnosis Date Noted  . Dislocated patella 02/21/2017  . Trochanteric bursitis, right hip 09/24/2016  . Myofascitis 09/24/2016  . Post  laminectomy syndrome 09/24/2016  . Chronic pain syndrome 09/24/2016  . Spondylosis without myelopathy or radiculopathy, lumbar region 07/30/2016  . Anxiety and depression 01/02/2016  . Hx of breast reconstruction 09/27/2015  . Breast cancer of upper-outer quadrant of left female breast ([facility]) 09/09/2014  . HTN (hypertension) 12/24/2012  . Migraine with aura 12/24/2012  . GERD (gastroesophageal reflux disease) 12/24/2012  . Hearing loss in left ear 12/24/2012  . 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 Insomnia 12/24/2012  . Diverticulosis 12/24/2012  . Personal history of colonic polyps 12/24/2012  . Hyperlipidemia 12/24/2012  . BCC (basal cell carcinoma), arm 12/24/2012   Past Medical History:  Diagnosis Date  . Allergy   . 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 Anxiety    "sometimes" (09/23/2014)  . Cancer of left breast ([facility]) 08/2014   left upper outer  . Cerebral meningioma ([facility]) 09/2012   auditory; s/p excision  . Chronic lower back pain   . Chronic pain in left foot   . Deafness in left ear   . Facial droop    "left side; from brain tumor OR"  . GERD (gastroesophageal reflux disease)   . History of chemotherapy    last treatment 03/02/2015  . History of hiatal hernia   . Hypertension   . Migraine    "before brain tumor  was removed" (09/23/2014)  . Personal history of chemotherapy 09/12/2014  . PONV (postoperative nausea and vomiting)    "severe"  . Stress incontinence    Family History  Problem Relation Age of Onset  . Adopted: Yes  . Hypertension Father    Past Surgical History:  Procedure Laterality Date  . AUGMENTATION MAMMAPLASTY Bilateral 11/11/2015  . BACK SURGERY    . BRAIN SURGERY  2014   cerebellopontine angle tumor with craniotomy   . BREAST BIOPSY Left 08/2014  . BREAST RECONSTRUCTION Left 05/18/2015  . BREAST RECONSTRUCTION WITH PLACEMENT OF TISSUE EXPANDER AND FLEX HD (ACELLULAR HYDRATED DERMIS) Left 05/18/2015   Procedure: LEFT BREAST  RECONSTRUCTION WITH PLACEMENT OF TISSUE EXPANDER AND ACELLULAR HYDRATED DERMIS MATRIX;  Surgeon: [name], MD;  Location: [facility];  Service: Plastics;  Laterality: Left;  . COLONOSCOPY    . FOOT FRACTURE SURGERY Left 1990's  . FOOT SURGERY Left    "one the muscle; trying to roll foot inward after failed recovery S/P fracture"  . FRACTURE SURGERY    . JOINT REPLACEMENT    . KNEE ARTHROSCOPY Left   . MASTECTOMY COMPLETE / SIMPLE W/ SENTINEL NODE BIOPSY Left 09/23/2014  . MULTIPLE TOOTH EXTRACTIONS    . PARTIAL KNEE ARTHROPLASTY Left 2000's    Dr. Sharol Given  . PATELLAR TENDON REPAIR  05/27/2012   Procedure: PATELLA TENDON REPAIR;  Surgeon: Newt Minion, MD;  Location: [facility];  Service: Orthopedics;  Laterality: Right;  Right Patella Tendon  Reconstruction with Semi Tendonosum  . PORT-A-CATH REMOVAL Right 03/16/2015   Procedure: REMOVAL PORT-A-CATH;  Surgeon: Fanny Skates, MD;  Location: [facility];  Service: General;  Laterality: Right;  . PORTACATH PLACEMENT Right 09/23/2014  . PORTACATH PLACEMENT Right 09/23/2014   Procedure: INSERTION PORT-A-CATH RIGHT SUBCLAVIAN;  Surgeon: Fanny Skates, MD;  Location: [facility];  Service: General;  Laterality: Right;  . POSTERIOR LUMBAR FUSION  02/1988   L2 reconstruction; fusion  . SIMPLE MASTECTOMY WITH AXILLARY SENTINEL NODE BIOPSY Left 09/23/2014   Procedure: LEFT TOTAL MASTECTOMY WITH LEFT AXILLARY SENTINEL LYMPH NODE BIOPSY;  Surgeon: Fanny Skates, MD;  Location: [facility];  Service: General;  Laterality: Left;  . TOTAL ABDOMINAL HYSTERECTOMY  12/1976  . TOTAL KNEE ARTHROPLASTY Right 05/24/2009    Dr. Sharol Given  . UPPER GASTROINTESTINAL ENDOSCOPY    . WISDOM TOOTH EXTRACTION     Social History   Occupational History  . disability     [employer]   Social History Main Topics  . Smoking status: Former Smoker    Packs/day: 0.25    Years: 20.00    Types: Cigarettes    Quit date: 05/22/1995  . Smokeless tobacco: Never Used  . Alcohol use No  . Drug use: No  . Sexual  activity: Yes    Partners: Male    Birth control/ protection: None     Comment: menarche age 44, P 0 ,HRT x 42 yrs

## 2017-04-07 NOTE — Patient Instructions (Signed)

## 2017-04-07 NOTE — Progress Notes (Deleted)
Last injection helped a lot. Has been going to physical therapy. Right lateral hip pain has returned.

## 2017-04-08 ENCOUNTER — Encounter: Payer: Self-pay | Admitting: Physician Assistant

## 2017-04-08 ENCOUNTER — Ambulatory Visit (INDEPENDENT_AMBULATORY_CARE_PROVIDER_SITE_OTHER): Payer: BLUE CROSS/BLUE SHIELD | Admitting: Physician Assistant

## 2017-04-08 VITALS — BP 142/88 | HR 84 | Temp 98.1°F | Resp 18 | Ht 64.76 in | Wt 156.4 lb

## 2017-04-08 DIAGNOSIS — S83004D Unspecified dislocation of right patella, subsequent encounter: Secondary | ICD-10-CM

## 2017-04-08 DIAGNOSIS — G510 Bell's palsy: Secondary | ICD-10-CM | POA: Diagnosis not present

## 2017-04-08 DIAGNOSIS — C50412 Malignant neoplasm of upper-outer quadrant of left female breast: Secondary | ICD-10-CM

## 2017-04-08 DIAGNOSIS — I10 Essential (primary) hypertension: Secondary | ICD-10-CM | POA: Diagnosis not present

## 2017-04-08 DIAGNOSIS — E785 Hyperlipidemia, unspecified: Secondary | ICD-10-CM | POA: Diagnosis not present

## 2017-04-08 DIAGNOSIS — Z170 Estrogen receptor positive status [ER+]: Secondary | ICD-10-CM

## 2017-04-08 DIAGNOSIS — H918X2 Other specified hearing loss, left ear: Secondary | ICD-10-CM

## 2017-04-08 MED ORDER — LISINOPRIL 20 MG PO TABS: 20.0000 mg | ORAL_TABLET | Freq: Every day | ORAL | 3 refills | Status: DC

## 2017-04-08 NOTE — Assessment & Plan Note (Signed)
Continue follow-up per oncology.

## 2017-04-08 NOTE — Assessment & Plan Note (Signed)
INCREASE lisinopril from 10 mg to 20 mg. CONTINUE metoprolol succinate 100 mg daily.

## 2017-04-08 NOTE — Patient Instructions (Addendum)
INCREASE the lisinopril to 20 mg by taking 2 10 mg tablets until your use up what you have, then start the 20 mg tablets that I sent to the pharmacy.    IF you received an x-ray today, you will receive an invoice from [facility]. Please contact [facility] at [phone] with questions or concerns regarding your invoice.   IF you received labwork today, you will receive an invoice from [facility]. Please contact [facility] at [phone] with questions or concerns regarding your invoice.   Our billing staff will not be able to assist you with questions regarding bills from these companies.  You will be contacted with the lab results as soon as they are available. The fastest way to get your results is to activate your My Chart account. Instructions are located on the last page of this paperwork. If you have not heard from [place] regarding the results in 2 weeks, please contact this office.

## 2017-04-08 NOTE — Assessment & Plan Note (Signed)
Referred for second opinion, at her request.
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
## 2017-04-08 NOTE — Assessment & Plan Note (Signed)
Stable. Secondary to resection of "Mallie Mussel," a cerebral hemangioma-auditory.

## 2017-04-09 LAB — LIPID PANEL
CHOL/HDL RATIO: 4.4 ratio (ref 0.0–4.4)
Cholesterol, Total: 198 mg/dL (ref 100–199)
HDL: 45 mg/dL (ref >39)
LDL Calculated: 94 mg/dL (ref 0–99)
TRIGLYCERIDES: 294 mg/dL — AB (ref 0–149)
VLDL Cholesterol Cal: 59 mg/dL — ABNORMAL HIGH (ref 5–40)

## 2017-04-09 LAB — COMPREHENSIVE METABOLIC PANEL
A/G RATIO: 2.4 — AB (ref 1.2–2.2)
ALT: 10 IU/L (ref 0–32)
AST: 15 IU/L (ref 0–40)
Albumin: 4.7 g/dL (ref 3.6–4.8)
Alkaline Phosphatase: 59 IU/L (ref 39–117)
BILIRUBIN TOTAL: 0.3 mg/dL (ref 0.0–1.2)
BUN/Creatinine Ratio: 13 (ref 12–28)
BUN: 8 mg/dL (ref 8–27)
CALCIUM: 9.8 mg/dL (ref 8.7–10.3)
CHLORIDE: 101 mmol/L (ref 96–106)
CO2: 22 mmol/L (ref 20–29)
Creatinine, Ser: 0.63 mg/dL (ref 0.57–1.00)
GFR, EST AFRICAN AMERICAN: 110 mL/min/{1.73_m2} (ref >59)
GFR, EST NON AFRICAN AMERICAN: 96 mL/min/{1.73_m2} (ref >59)
GLOBULIN, TOTAL: 2 g/dL (ref 1.5–4.5)
Glucose: 105 mg/dL — ABNORMAL HIGH (ref 65–99)
POTASSIUM: 4.6 mmol/L (ref 3.5–5.2)
SODIUM: 140 mmol/L (ref 134–144)
Total Protein: 6.7 g/dL (ref 6.0–8.5)

## 2017-04-09 LAB — CBC WITH DIFFERENTIAL/PLATELET
BASOS: 0 %
Basophils Absolute: 0 10*3/uL (ref 0.0–0.2)
EOS (ABSOLUTE): 0.1 10*3/uL (ref 0.0–0.4)
Eos: 1 %
Hematocrit: 39.7 % (ref 34.0–46.6)
Hemoglobin: 12.8 g/dL (ref 11.1–15.9)
IMMATURE GRANS (ABS): 0 10*3/uL (ref 0.0–0.1)
Immature Granulocytes: 0 %
LYMPHS: 22 %
Lymphocytes Absolute: 2 10*3/uL (ref 0.7–3.1)
MCH: 26.7 pg (ref 26.6–33.0)
MCHC: 32.2 g/dL (ref 31.5–35.7)
MCV: 83 fL (ref 79–97)
MONOS ABS: 1.1 10*3/uL — AB (ref 0.1–0.9)
Monocytes: 12 %
NEUTROS ABS: 6.1 10*3/uL (ref 1.4–7.0)
Neutrophils: 65 %
PLATELETS: 515 10*3/uL — AB (ref 150–379)
RBC: 4.79 x10E6/uL (ref 3.77–5.28)
RDW: 14 % (ref 12.3–15.4)
WBC: 9.3 10*3/uL (ref 3.4–10.8)

## 2017-04-09 MED ORDER — BUPIVACAINE HCL 0.25 % IJ SOLN
4.0000 mL | INTRAMUSCULAR | Status: AC | PRN
  Administered 2017-04-07: 4 mL via INTRA_ARTICULAR

## 2017-04-09 MED ORDER — TRIAMCINOLONE ACETONIDE 40 MG/ML IJ SUSP
80.0000 mg | INTRAMUSCULAR | Status: AC | PRN
  Administered 2017-04-07: 80 mg via INTRA_ARTICULAR

## 2017-04-09 MED ORDER — LIDOCAINE HCL 2 % IJ SOLN
4.0000 mL | INTRAMUSCULAR | Status: AC | PRN
  Administered 2017-04-07: 4 mL

## 2017-04-11 ENCOUNTER — Encounter: Payer: Self-pay | Admitting: Physician Assistant

## 2017-04-11 DIAGNOSIS — R7303 Prediabetes: Secondary | ICD-10-CM | POA: Insufficient documentation

## 2017-04-21 DIAGNOSIS — M25561 Pain in right knee: Secondary | ICD-10-CM | POA: Diagnosis not present

## 2017-04-23 ENCOUNTER — Telehealth: Payer: Self-pay | Admitting: Physician Assistant

## 2017-04-23 ENCOUNTER — Encounter: Payer: Self-pay | Admitting: Physician Assistant

## 2017-04-23 DIAGNOSIS — F419 Anxiety disorder, unspecified: Secondary | ICD-10-CM

## 2017-04-23 DIAGNOSIS — G4700 Insomnia, unspecified: Secondary | ICD-10-CM

## 2017-04-23 DIAGNOSIS — F32A Depression, unspecified: Secondary | ICD-10-CM

## 2017-04-23 DIAGNOSIS — F329 Major depressive disorder, single episode, unspecified: Secondary | ICD-10-CM

## 2017-04-25 MED ORDER — ZOLPIDEM TARTRATE 10 MG PO TABS: 10.0000 mg | ORAL_TABLET | Freq: Every day | ORAL | 0 refills | Status: DC

## 2017-04-25 MED ORDER — ALPRAZOLAM 0.25 MG PO TABS: 0.2500 mg | ORAL_TABLET | Freq: Two times a day (BID) | ORAL | 0 refills | Status: DC | PRN

## 2017-04-25 NOTE — Telephone Encounter (Signed)
Meds ordered this encounter  Medications  . ALPRAZolam (XANAX) 0.25 MG tablet    Sig: Take 1 tablet (0.25 mg total) by mouth 2 (two) times daily as needed.    Dispense:  60 tablet    Refill:  0    Not to exceed 5 additional fills before 06/04/2017    Order Specific Question:   Supervising Provider    Answer:   Brigitte Pulse, EVA N [[id number]]  . zolpidem (AMBIEN) 10 MG tablet    Sig: Take 1 tablet (10 mg total) by mouth at bedtime.    Dispense:  30 tablet    Refill:  0    Not to exceed 5 additional fills before 05/29/2017    Order Specific Question:   Supervising Provider    Answer:   Brigitte Pulse, EVA N [[id number]]   Called to patient's pharmacy.

## 2017-04-28 NOTE — Telephone Encounter (Signed)
?  Duplicate request? Prescriptions requested were authorized last week.

## 2017-05-02 DIAGNOSIS — G5752 Tarsal tunnel syndrome, left lower limb: Secondary | ICD-10-CM | POA: Diagnosis not present

## 2017-05-02 DIAGNOSIS — M722 Plantar fascial fibromatosis: Secondary | ICD-10-CM | POA: Diagnosis not present

## 2017-05-12 DIAGNOSIS — M65872 Other synovitis and tenosynovitis, left ankle and foot: Secondary | ICD-10-CM | POA: Diagnosis not present

## 2017-05-12 DIAGNOSIS — G5752 Tarsal tunnel syndrome, left lower limb: Secondary | ICD-10-CM | POA: Diagnosis not present

## 2017-05-21 ENCOUNTER — Other Ambulatory Visit: Payer: Self-pay | Admitting: Physician Assistant

## 2017-05-21 DIAGNOSIS — F32A Depression, unspecified: Secondary | ICD-10-CM

## 2017-05-21 DIAGNOSIS — F329 Major depressive disorder, single episode, unspecified: Secondary | ICD-10-CM

## 2017-05-21 DIAGNOSIS — G4700 Insomnia, unspecified: Secondary | ICD-10-CM

## 2017-05-21 DIAGNOSIS — F419 Anxiety disorder, unspecified: Secondary | ICD-10-CM

## 2017-05-22 MED ORDER — ZOLPIDEM TARTRATE 10 MG PO TABS: 10.0000 mg | ORAL_TABLET | Freq: Every day | ORAL | 0 refills | Status: DC

## 2017-05-22 MED ORDER — ALPRAZOLAM 0.25 MG PO TABS: 0.2500 mg | ORAL_TABLET | Freq: Two times a day (BID) | ORAL | 0 refills | Status: DC | PRN

## 2017-05-22 NOTE — Telephone Encounter (Signed)
Meds ordered this encounter  Medications  . ALPRAZolam (XANAX) 0.25 MG tablet    Sig: Take 1 tablet (0.25 mg total) by mouth 2 (two) times daily as needed.    Dispense:  60 tablet    Refill:  0    Not to exceed 5 additional fills before 06/04/2017  . zolpidem (AMBIEN) 10 MG tablet    Sig: Take 1 tablet (10 mg total) by mouth at bedtime.    Dispense:  30 tablet    Refill:  0    Not to exceed 5 additional fills before 05/29/2017

## 2017-05-24 ENCOUNTER — Other Ambulatory Visit: Payer: Self-pay | Admitting: Physician Assistant

## 2017-05-24 DIAGNOSIS — G4700 Insomnia, unspecified: Secondary | ICD-10-CM

## 2017-05-24 DIAGNOSIS — F419 Anxiety disorder, unspecified: Secondary | ICD-10-CM

## 2017-05-24 DIAGNOSIS — F329 Major depressive disorder, single episode, unspecified: Secondary | ICD-10-CM

## 2017-05-26 ENCOUNTER — Telehealth: Payer: Self-pay | Admitting: Physician Assistant

## 2017-05-26 ENCOUNTER — Encounter: Payer: Self-pay | Admitting: Physician Assistant

## 2017-05-26 ENCOUNTER — Telehealth: Payer: Self-pay

## 2017-05-26 NOTE — Telephone Encounter (Signed)
This was done on 10/11 - can someone check on this for the patient please and if not at the pharmacy call in and then let the patient know.

## 2017-05-26 NOTE — Telephone Encounter (Signed)
Pt states that the medication she requested for refill was called in on 05/22/17 and per Windell Hummingbird if this had not been seen by the pharmacy by 100 to have [place] call this in and no the patient is waiting to have this called in   Best number [phone]

## 2017-05-26 NOTE — Telephone Encounter (Signed)
This was addressed on 05/22/2017
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
## 2017-05-27 ENCOUNTER — Ambulatory Visit: Payer: Medicare Other | Admitting: Physician Assistant

## 2017-05-27 NOTE — Telephone Encounter (Signed)
This has been addressed in another thread.

## 2017-05-28 DIAGNOSIS — L600 Ingrowing nail: Secondary | ICD-10-CM | POA: Diagnosis not present

## 2017-06-02 ENCOUNTER — Telehealth (INDEPENDENT_AMBULATORY_CARE_PROVIDER_SITE_OTHER): Payer: Self-pay | Admitting: Physical Medicine and Rehabilitation

## 2017-06-02 NOTE — Telephone Encounter (Signed)
Left message for patient to call back to schedule.  °

## 2017-06-02 NOTE — Telephone Encounter (Signed)
If not on EPIC then can look at [facility] but it is L5-S1 facet

## 2017-06-02 NOTE — Telephone Encounter (Signed)
Scheduled for 10/31 with driver.

## 2017-06-11 ENCOUNTER — Ambulatory Visit (INDEPENDENT_AMBULATORY_CARE_PROVIDER_SITE_OTHER): Payer: Medicare Other

## 2017-06-11 ENCOUNTER — Ambulatory Visit (INDEPENDENT_AMBULATORY_CARE_PROVIDER_SITE_OTHER): Payer: BLUE CROSS/BLUE SHIELD | Admitting: Physical Medicine and Rehabilitation

## 2017-06-11 ENCOUNTER — Encounter (INDEPENDENT_AMBULATORY_CARE_PROVIDER_SITE_OTHER): Payer: Self-pay | Admitting: Physical Medicine and Rehabilitation

## 2017-06-11 VITALS — BP 143/85 | HR 74 | Temp 98.2°F

## 2017-06-11 DIAGNOSIS — M47816 Spondylosis without myelopathy or radiculopathy, lumbar region: Secondary | ICD-10-CM | POA: Diagnosis not present

## 2017-06-11 DIAGNOSIS — M609 Myositis, unspecified: Secondary | ICD-10-CM

## 2017-06-11 DIAGNOSIS — G894 Chronic pain syndrome: Secondary | ICD-10-CM | POA: Diagnosis not present

## 2017-06-11 DIAGNOSIS — L03032 Cellulitis of left toe: Secondary | ICD-10-CM | POA: Diagnosis not present

## 2017-06-11 MED ORDER — LIDOCAINE HCL (PF) 1 % IJ SOLN
2.0000 mL | Freq: Once | INTRAMUSCULAR | Status: AC
  Administered 2017-06-11: 2 mL

## 2017-06-11 MED ORDER — METHYLPREDNISOLONE ACETATE 80 MG/ML IJ SUSP
80.0000 mg | Freq: Once | INTRAMUSCULAR | Status: AC
  Administered 2017-06-11: 80 mg

## 2017-06-11 NOTE — Progress Notes (Deleted)
Pt here for facet lumbar injection, pt is in pain in lower back Right side worse pain. Radiates to Right hip and thigh. When sitting or laying is the worse pain. Has driver, no allergy to contrast. No blood thinners.

## 2017-06-11 NOTE — Patient Instructions (Signed)

## 2017-06-13 DIAGNOSIS — D333 Benign neoplasm of cranial nerves: Secondary | ICD-10-CM | POA: Diagnosis not present

## 2017-06-13 DIAGNOSIS — J310 Chronic rhinitis: Secondary | ICD-10-CM | POA: Diagnosis not present

## 2017-06-16 DIAGNOSIS — Z853 Personal history of malignant neoplasm of breast: Secondary | ICD-10-CM | POA: Diagnosis not present

## 2017-06-18 DIAGNOSIS — J310 Chronic rhinitis: Secondary | ICD-10-CM | POA: Diagnosis not present

## 2017-06-19 NOTE — Procedures (Signed)
Amy [name] is a 66 year old female with prior longstem thoracolumbar fusion secondary to burst fracture.  She has obtained good relief with intermittent facet joint block below the level of fusion which is at L5-S1.  She has consistent pain today with lower back pain right more than left with bilateral.  It does radiate to the hip and thigh at times without numbness or tingling.  He gets a lot of pain or more prolonged sitting or trying to lay flat.  Very stiff lumbar spine.  We have seen her on several occasions where she gets a combination of a trigger point injection or facet joint block.  We have not seen her for quite some time for facet joint block.  She really has done all manner of conservative care at her chart can be well reviewed before her multiple medical issues.  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  Cancer.  She continues to be active and doing well otherwise.  Lumbar Facet Joint Intra-Articular Injection(s) with Fluoroscopic Guidance  Patient: Amy [name]      Date of Birth: 11-14-1950 MRN: [id number] PCP: [name], PA-C      Visit Date: 06/11/2017   Universal Protocol:    Date/Time: 06/11/2017  Consent Given By: the patient  Position: PRONE   Additional Comments: Vital signs were monitored before and after the procedure. Patient was prepped and draped in the usual sterile fashion. The correct patient, procedure, and site was verified.   Injection Procedure Details:  Procedure Site One Meds Administered:  Meds ordered this encounter  Medications  . lidocaine (PF) (XYLOCAINE) 1 % injection 2 mL  . methylPREDNISolone acetate (DEPO-MEDROL) injection 80 mg     Laterality: Bilateral  Location/Site:  L5-S1  Needle size: 22 guage  Needle type: Spinal  Needle Placement: Articular  Findings:  -Contrast Used: 1 mL iohexol 180 mg iodine/mL   -Comments: Excellent flow of contrast producing a partial arthrogram.  Procedure Details: The fluoroscope beam is  vertically oriented in AP, and the inferior recess is visualized beneath the lower pole of the inferior apophyseal process, which represents the target point for needle insertion. When direct visualization is difficult the target point is located at the medial projection of the vertebral pedicle. The region overlying each aforementioned target is locally anesthetized with a 1 to 2 ml. volume of 1% Lidocaine without Epinephrine.   The spinal needle was inserted into each of the above mentioned facet joints using biplanar fluoroscopic guidance. A 0.25 to 0.5 ml. volume of Isovue-250 was injected and a partial facet joint arthrogram was obtained. A single spot film was obtained of the resulting arthrogram.    One to 1.25 ml of the steroid/anesthetic solution was then injected into each of the facet joints noted above.   Additional Comments:  The patient tolerated the procedure well Dressing: Band-Aid    Post-procedure details: Patient was observed during the procedure. Post-procedure instructions were reviewed.  Patient left the clinic in stable condition.

## 2017-06-23 ENCOUNTER — Other Ambulatory Visit: Payer: Self-pay | Admitting: Physician Assistant

## 2017-06-23 DIAGNOSIS — F329 Major depressive disorder, single episode, unspecified: Secondary | ICD-10-CM

## 2017-06-23 DIAGNOSIS — F419 Anxiety disorder, unspecified: Secondary | ICD-10-CM

## 2017-06-23 DIAGNOSIS — G4700 Insomnia, unspecified: Secondary | ICD-10-CM

## 2017-06-23 NOTE — Telephone Encounter (Signed)
Please advise 

## 2017-06-24 ENCOUNTER — Ambulatory Visit: Payer: Medicare Other

## 2017-06-24 MED ORDER — ZOLPIDEM TARTRATE 10 MG PO TABS: 10.0000 mg | ORAL_TABLET | Freq: Every day | ORAL | 0 refills | Status: DC

## 2017-06-24 MED ORDER — ALPRAZOLAM 0.25 MG PO TABS: 0.2500 mg | ORAL_TABLET | Freq: Two times a day (BID) | ORAL | 0 refills | Status: DC | PRN

## 2017-06-25 ENCOUNTER — Other Ambulatory Visit: Payer: Self-pay | Admitting: Physician Assistant

## 2017-06-25 DIAGNOSIS — F419 Anxiety disorder, unspecified: Secondary | ICD-10-CM

## 2017-06-25 DIAGNOSIS — G4700 Insomnia, unspecified: Secondary | ICD-10-CM

## 2017-06-25 DIAGNOSIS — F329 Major depressive disorder, single episode, unspecified: Secondary | ICD-10-CM

## 2017-06-25 NOTE — Telephone Encounter (Signed)
Controlled substance 

## 2017-06-25 NOTE — Telephone Encounter (Signed)
Verbal given to pharmacy-[facility] Unable to find written rx.

## 2017-06-27 ENCOUNTER — Ambulatory Visit (INDEPENDENT_AMBULATORY_CARE_PROVIDER_SITE_OTHER): Payer: BLUE CROSS/BLUE SHIELD | Admitting: Physician Assistant

## 2017-06-27 ENCOUNTER — Encounter: Payer: Self-pay | Admitting: Physician Assistant

## 2017-06-27 ENCOUNTER — Other Ambulatory Visit: Payer: Self-pay

## 2017-06-27 ENCOUNTER — Ambulatory Visit (INDEPENDENT_AMBULATORY_CARE_PROVIDER_SITE_OTHER): Payer: Medicare Other

## 2017-06-27 VITALS — BP 140/88 | HR 94 | Temp 98.1°F | Ht 65.0 in | Wt 152.8 lb

## 2017-06-27 VITALS — BP 140/88 | HR 81 | Temp 98.1°F | Resp 18 | Ht 64.76 in | Wt 152.8 lb

## 2017-06-27 DIAGNOSIS — E785 Hyperlipidemia, unspecified: Secondary | ICD-10-CM

## 2017-06-27 DIAGNOSIS — I10 Essential (primary) hypertension: Secondary | ICD-10-CM | POA: Diagnosis not present

## 2017-06-27 DIAGNOSIS — G4700 Insomnia, unspecified: Secondary | ICD-10-CM

## 2017-06-27 DIAGNOSIS — F419 Anxiety disorder, unspecified: Secondary | ICD-10-CM | POA: Diagnosis not present

## 2017-06-27 DIAGNOSIS — Z0000 Encounter for general adult medical examination without abnormal findings: Secondary | ICD-10-CM | POA: Diagnosis not present

## 2017-06-27 DIAGNOSIS — M7752 Other enthesopathy of left foot: Secondary | ICD-10-CM | POA: Diagnosis not present

## 2017-06-27 DIAGNOSIS — F329 Major depressive disorder, single episode, unspecified: Secondary | ICD-10-CM | POA: Diagnosis not present

## 2017-06-27 DIAGNOSIS — R739 Hyperglycemia, unspecified: Secondary | ICD-10-CM | POA: Diagnosis not present

## 2017-06-27 DIAGNOSIS — F32A Depression, unspecified: Secondary | ICD-10-CM

## 2017-06-27 DIAGNOSIS — G5762 Lesion of plantar nerve, left lower limb: Secondary | ICD-10-CM | POA: Diagnosis not present

## 2017-06-27 MED ORDER — METOPROLOL SUCCINATE ER 100 MG PO TB24: ORAL_TABLET | ORAL | 1 refills | Status: DC

## 2017-06-27 NOTE — Progress Notes (Signed)
Patient ID: Amy [name], female    DOB: 1951/05/07, 66 y.o.   MRN: [id number]  PCP: [name], PA-C  Chief Complaint  Patient presents with  . Hypertension  . Hyperlipidemia  . Follow-up    Subjective:   Presents for evaluation of HTN and hyperlipidemia. Her glucose was elevated at her last visit, and she was not fasting, so we intend to repeat it today fasting.  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 She's feeling a little nervous about it.  Home BP running 100's/60's.  Has lost a little weight. No changes in her eating or exercise, but she thinks she might be outside more with the cooler weather.   Review of Systems  Constitutional: Negative.   HENT: Negative for sore throat.   Eyes: Negative for visual disturbance.  Respiratory: Negative for cough, chest tightness, shortness of breath and wheezing.   Cardiovascular: Negative for chest pain and palpitations.  Gastrointestinal: Negative for abdominal pain, diarrhea, nausea and vomiting.  Genitourinary: Negative for dysuria, frequency, hematuria and urgency.  Musculoskeletal: Negative for arthralgias and myalgias.  Skin: Negative for rash.  Neurological: Positive for dizziness (no worse than normal). Negative for tremors, seizures, syncope, facial asymmetry, speech difficulty, weakness, light-headedness, numbness and headaches.  Psychiatric/Behavioral: Negative for decreased concentration. The patient is not nervous/anxious.        Patient Active Problem List   Diagnosis Date Noted  . Hyperglycemia 04/11/2017  . Dislocated patella 02/21/2017  . Trochanteric bursitis, right hip 09/24/2016  . Myofascitis 09/24/2016  . Post laminectomy syndrome 09/24/2016  . Chronic pain syndrome 09/24/2016  . Spondylosis without myelopathy or radiculopathy, lumbar region  07/30/2016  . Anxiety and depression 01/02/2016  . Hx of breast reconstruction 09/27/2015  . Breast cancer of upper-outer quadrant of left female breast ([place]) 09/09/2014  . HTN (hypertension) 12/24/2012  . Migraine with aura 12/24/2012  . GERD (gastroesophageal reflux disease) 12/24/2012  . Hearing loss in left ear 12/24/2012  . 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 Insomnia 12/24/2012  . Diverticulosis 12/24/2012  . Personal history of colonic polyps 12/24/2012  . Hyperlipidemia 12/24/2012  . BCC (basal cell carcinoma), arm 12/24/2012    Prior to Admission medications   Medication Sig Start Date End Date Taking? Authorizing Provider  ALPRAZolam Duanne Moron) 0.25 MG tablet Take 1 tablet (0.25 mg total) 2 (two) times daily as needed by mouth. 06/24/17  Yes Ioana Louks, PA-C  citalopram (CELEXA) 40 MG tablet TAKE 1 TABLET BY MOUTH EVERY DAY 01/14/17  Yes Quentavious Rittenhouse, PA-C  HYDROcodone-acetaminophen (NORCO/VICODIN) 5-325 MG tablet  09/06/15  Yes [provider]  ipratropium (ATROVENT) 0.06 % nasal spray Place 2 sprays 4 (four) times daily into both nostrils. 06/13/17 07/11/17 Yes [provider]  lansoprazole (PREVACID) 15 MG capsule Take 30 mg by mouth daily at 12 noon. Reported on 01/02/2016   Yes [provider]  lisinopril (PRINIVIL,ZESTRIL) 20 MG tablet Take 1 tablet (20 mg total) by mouth daily. 04/08/17  Yes Lonni Dirden, PA-C  metoprolol succinate (TOPROL-XL) 100 MG 24 hr tablet TAKE 1 TABLET (100 MG TOTAL) BY MOUTH DAILY. TAKE WITH OR IMMEDIATELY FOLLOWING A MEAL. 02/02/17  Yes Torey Regan, PA-C  zolpidem (AMBIEN) 10 MG tablet Take 1 tablet (10 mg total) at bedtime by mouth. 06/24/17  Yes [name], PA-C  Allergies  Allergen Reactions  . Codeine Nausea And Vomiting  . Darvocet [Propoxyphene N-Acetaminophen] Other (See Comments)    Pruritis, rapid heart rate  . Tramadol Other (See Comments)    Chest tightness  . Gold-Containing Drug Products  Swelling  . Meperidine Nausea And Vomiting  . Morphine Nausea And Vomiting  . Verapamil     fatigue  . Sulfa Antibiotics Rash       Objective:  Physical Exam  Constitutional: She is oriented to person, place, and time. She appears well-developed and well-nourished. She is active and cooperative. No distress.  BP 140/88 (BP Location: Right Arm, Patient Position: Sitting, Cuff Size: Normal)   Pulse 81   Temp 98.1 F (36.7 C) (Oral)   Resp 18   Ht 5' 4.76" (1.645 m)   Wt 152 lb 12.8 oz (69.3 kg)   SpO2 98%   BMI 25.62 kg/m    HENT:  Head: Normocephalic and atraumatic.  Right Ear: Hearing normal.  Left Ear: Hearing normal.  Eyes: Conjunctivae are normal. No scleral icterus.  Neck: Normal range of motion. Neck supple. No thyromegaly present.  Cardiovascular: Normal rate, regular rhythm and normal heart sounds.  Pulses:      Radial pulses are 2+ on the right side, and 2+ on the left side.  Pulmonary/Chest: Effort normal and breath sounds normal.  Lymphadenopathy:       Head (right side): No tonsillar, no preauricular, no posterior auricular and no occipital adenopathy present.       Head (left side): No tonsillar, no preauricular, no posterior auricular and no occipital adenopathy present.    She has no cervical adenopathy.       Right: No supraclavicular adenopathy present.       Left: No supraclavicular adenopathy present.  Neurological: She is alert and oriented to person, place, and time. A cranial nerve deficit is present. No sensory deficit.  Palsy of LEFT face  Skin: Skin is warm, dry and intact. No rash noted. No cyanosis or erythema. Nails show no clubbing.  Psychiatric: She has a normal mood and affect. Her speech is normal and behavior is normal.     Wt Readings from Last 3 Encounters:  06/27/17 152 lb 12.8 oz (69.3 kg)  04/08/17 156 lb 6.4 oz (70.9 kg)  03/11/17 154 lb (69.9 kg)       Assessment & Plan:   Problem List Items Addressed This Visit    HTN  (hypertension) - Primary    Uncontrolled here today. Normal at home. Continue to monitor and continue current treatment. Bring home monitor to next visit to compare together.      Relevant Medications   metoprolol succinate (TOPROL-XL) 100 MG 24 hr tablet   Other Relevant Orders   Comprehensive metabolic panel   CBC with Differential/Platelet   TSH   Insomnia    Controlled.       Hyperlipidemia    Await labs. Initiate treatment as indicated by results.        Relevant Medications   metoprolol succinate (TOPROL-XL) 100 MG 24 hr tablet   Other Relevant Orders   Comprehensive metabolic panel   Lipid panel   Anxiety and depression   Relevant Orders   TSH   Hyperglycemia    Was not fasting last visit. Recheck today with A1C. If normal, will resolve this problem.      Relevant Orders   Comprehensive metabolic panel   Hemoglobin A1c       Return in about  6 months (around 12/25/2017) for re-evalaution of blood pressure and cholesterol, with FASTING labs.   Fara Chute, PA-C [facility] [place]

## 2017-06-27 NOTE — Progress Notes (Deleted)
     Patient ID: Amy [name], female    DOB: 11-10-1950, 66 y.o.   MRN: [id number]  PCP: [name], PA-C  Chief Complaint  Patient presents with  . Hypertension  . Hyperlipidemia  . Follow-up    Subjective:   Presents for evaluation of ***.  ***.    Review of Systems     Patient Active Problem List   Diagnosis Date Noted  . Hyperglycemia 04/11/2017  . Dislocated patella 02/21/2017  . Trochanteric bursitis, right hip 09/24/2016  . Myofascitis 09/24/2016  . Post laminectomy syndrome 09/24/2016  . Chronic pain syndrome 09/24/2016  . Spondylosis without myelopathy or radiculopathy, lumbar region 07/30/2016  . Anxiety and depression 01/02/2016  . Hx of breast reconstruction 09/27/2015  . Breast cancer of upper-outer quadrant of left female breast ([place]) 09/09/2014  . HTN (hypertension) 12/24/2012  . Migraine with aura 12/24/2012  . GERD (gastroesophageal reflux disease) 12/24/2012  . Hearing loss in left ear 12/24/2012  . 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 Insomnia 12/24/2012  . Diverticulosis 12/24/2012  . Personal history of colonic polyps 12/24/2012  . Hyperlipidemia 12/24/2012  . BCC (basal cell carcinoma), arm 12/24/2012     Prior to Admission medications   Medication Sig Start Date End Date Taking? Authorizing Provider  ALPRAZolam Duanne Moron) 0.25 MG tablet Take 1 tablet (0.25 mg total) 2 (two) times daily as needed by mouth. 06/24/17  Yes Chiyo Fay, PA-C  citalopram (CELEXA) 40 MG tablet TAKE 1 TABLET BY MOUTH EVERY DAY 01/14/17  Yes Saburo Luger, PA-C  HYDROcodone-acetaminophen (NORCO/VICODIN) 5-325 MG tablet  09/06/15  Yes [provider]  lansoprazole (PREVACID) 15 MG capsule Take 30 mg by mouth daily at 12 noon. Reported on 01/02/2016   Yes [provider]  lisinopril (PRINIVIL,ZESTRIL) 20 MG tablet Take 1 tablet (20 mg total) by mouth daily. 04/08/17  Yes Lestat Golob, PA-C  metoprolol succinate (TOPROL-XL) 100 MG 24 hr tablet  TAKE 1 TABLET (100 MG TOTAL) BY MOUTH DAILY. TAKE WITH OR IMMEDIATELY FOLLOWING A MEAL. 02/02/17  Yes Sayge Salvato, PA-C  zolpidem (AMBIEN) 10 MG tablet Take 1 tablet (10 mg total) at bedtime by mouth. 06/24/17  Yes Shefali Ng, PA-C  methocarbamol (ROBAXIN) 500 MG tablet Take 1 tablet (500 mg total) by mouth every 8 (eight) hours as needed for muscle spasms. Patient not taking: Reported on 06/27/2017 03/11/17   Newt Minion, MD     Allergies  Allergen Reactions  . Codeine Nausea And Vomiting  . Darvocet [Propoxyphene N-Acetaminophen] Other (See Comments)    Pruritis, rapid heart rate  . Tramadol Other (See Comments)    Chest tightness  . Gold-Containing Drug Products Swelling  . Meperidine Nausea And Vomiting  . Morphine Nausea And Vomiting  . Verapamil     fatigue  . Sulfa Antibiotics Rash       Objective:  Physical Exam         Assessment & Plan:   ***

## 2017-06-27 NOTE — Assessment & Plan Note (Signed)
Controlled.  

## 2017-06-27 NOTE — Patient Instructions (Signed)
     IF you received an x-ray today, you will receive an invoice from [facility]. Please contact  Radiology at [phone] with questions or concerns regarding your invoice.   IF you received labwork today, you will receive an invoice from [facility]. Please contact [facility] at [phone] with questions or concerns regarding your invoice.   Our billing staff will not be able to assist you with questions regarding bills from these companies.  You will be contacted with the lab results as soon as they are available. The fastest way to get your results is to activate your My Chart account. Instructions are located on the last page of this paperwork. If you have not heard from us regarding the results in 2 weeks, please contact this office.     

## 2017-06-27 NOTE — Assessment & Plan Note (Signed)
Await labs. Initiate treatment as indicated by results.

## 2017-06-27 NOTE — Assessment & Plan Note (Signed)
Was not fasting last visit. Recheck today with A1C. If normal, will resolve this problem.

## 2017-06-27 NOTE — Assessment & Plan Note (Signed)
Uncontrolled here today. Normal at home. Continue to monitor and continue current treatment. Bring home monitor to next visit to compare together.

## 2017-06-27 NOTE — Patient Instructions (Addendum)
[name] , Thank you for taking time to come for your Medicare Wellness Visit. I appreciate your ongoing commitment to your health goals. Please review the following plan we discussed and let me know if I can assist you in the future.   Screening recommendations/referrals: Colonoscopy: up to date, next due 01/27/2022 Mammogram: up to date, next due 10/04/2018 Bone Density: up to date Recommended yearly ophthalmology/optometry visit for glaucoma screening and checkup Recommended yearly dental visit for hygiene and checkup  Vaccinations: Influenza vaccine: up to date Pneumococcal vaccine: up to date, next due 02/24/2018 Tdap vaccine: up to date, next due 05/13/2019 Shingles vaccine: up to date, you state you had this vaccine this year   Advanced directives: Please bring a copy of your POA (Power of [place]) and/or Living Will to your next appointment.   Conditions/risks identified: Try to improve your social life and start vacationing more and doing fun activities.  Next appointment: schedule follow up with PCP, 1 year for AWV  Preventive Care 40-64 Years, Female Preventive care refers to lifestyle choices and visits with your health care provider that can promote health and wellness. What does preventive care include?  A yearly physical exam. This is also called an annual well check.  Dental exams once or twice a year.  Routine eye exams. Ask your health care provider how often you should have your eyes checked.  Personal lifestyle choices, including:  Daily care of your teeth and gums.  Regular physical activity.  Eating a healthy diet.  Avoiding tobacco and drug use.  Limiting alcohol use.  Practicing safe sex.  Taking low-dose aspirin daily starting at age 51.  Taking vitamin and mineral supplements as recommended by your health care provider. What happens during an annual well check? The services and screenings done by your health care provider during your  annual well check will depend on your age, overall health, lifestyle risk factors, and family history of disease. Counseling  Your health care provider may ask you questions about your:  Alcohol use.  Tobacco use.  Drug use.  Emotional well-being.  Home and relationship well-being.  Sexual activity.  Eating habits.  Work and work Statistician.  Method of birth control.  Menstrual cycle.  Pregnancy history. Screening  You may have the following tests or measurements:  Height, weight, and BMI.  Blood pressure.  Lipid and cholesterol levels. These may be checked every 5 years, or more frequently if you are over 15 years old.  Skin check.  Lung cancer screening. You may have this screening every year starting at age 25 if you have a 30-pack-year history of smoking and currently smoke or have quit within the past 15 years.  Fecal occult blood test (FOBT) of the stool. You may have this test every year starting at age 7.  Flexible sigmoidoscopy or colonoscopy. You may have a sigmoidoscopy every 5 years or a colonoscopy every 10 years starting at age 72.  Hepatitis C blood test.  Hepatitis B blood test.  Sexually transmitted disease (STD) testing.  Diabetes screening. This is done by checking your blood sugar (glucose) after you have not eaten for a while (fasting). You may have this done every 1-3 years.  Mammogram. This may be done every 1-2 years. Talk to your health care provider about when you should start having regular mammograms. This may depend on whether you have a family history of breast cancer.  BRCA-related cancer screening. This may be done if you have a family history  of breast, ovarian, tubal, or peritoneal cancers.  Pelvic exam and Pap test. This may be done every 3 years starting at age 61. Starting at age 13, this may be done every 5 years if you have a Pap test in combination with an HPV test.  Bone density scan. This is done to screen for  osteoporosis. You may have this scan if you are at high risk for osteoporosis. Discuss your test results, treatment options, and if necessary, the need for more tests with your health care provider. Vaccines  Your health care provider may recommend certain vaccines, such as:  Influenza vaccine. This is recommended every year.  Tetanus, diphtheria, and acellular pertussis (Tdap, Td) vaccine. You may need a Td booster every 10 years.  Zoster vaccine. You may need this after age 43.  Pneumococcal 13-valent conjugate (PCV13) vaccine. You may need this if you have certain conditions and were not previously vaccinated.  Pneumococcal polysaccharide (PPSV23) vaccine. You may need one or two doses if you smoke cigarettes or if you have certain conditions. Talk to your health care provider about which screenings and vaccines you need and how often you need them. This information is not intended to replace advice given to you by your health care provider. Make sure you discuss any questions you have with your health care provider. Document Released: 08/25/2015 Document Revised: 04/17/2016 Document Reviewed: 05/30/2015 Elsevier Interactive Patient Education  2017 [place] Prevention in the Home Falls can cause injuries. They can happen to people of all ages. There are many things you can do to make your home safe and to help prevent falls. What can I do on the outside of my home?  Regularly fix the edges of walkways and driveways and fix any cracks.  Remove anything that might make you trip as you walk through a door, such as a raised step or threshold.  Trim any bushes or trees on the path to your home.  Use bright outdoor lighting.  Clear any walking paths of anything that might make someone trip, such as rocks or tools.  Regularly check to see if handrails are loose or broken. Make sure that both sides of any steps have handrails.  Any raised decks and porches should have  guardrails on the edges.  Have any leaves, snow, or ice cleared regularly.  Use sand or salt on walking paths during winter.  Clean up any spills in your garage right away. This includes oil or grease spills. What can I do in the bathroom?  Use night lights.  Install grab bars by the toilet and in the tub and shower. Do not use towel bars as grab bars.  Use non-skid mats or decals in the tub or shower.  If you need to sit down in the shower, use a plastic, non-slip stool.  Keep the floor dry. Clean up any water that spills on the floor as soon as it happens.  Remove soap buildup in the tub or shower regularly.  Attach bath mats securely with double-sided non-slip rug tape.  Do not have throw rugs and other things on the floor that can make you trip. What can I do in the bedroom?  Use night lights.  Make sure that you have a light by your bed that is easy to reach.  Do not use any sheets or blankets that are too big for your bed. They should not hang down onto the floor.  Have a firm chair that  has side arms. You can use this for support while you get dressed.  Do not have throw rugs and other things on the floor that can make you trip. What can I do in the kitchen?  Clean up any spills right away.  Avoid walking on wet floors.  Keep items that you use a lot in easy-to-reach places.  If you need to reach something above you, use a strong step stool that has a grab bar.  Keep electrical cords out of the way.  Do not use floor polish or wax that makes floors slippery. If you must use wax, use non-skid floor wax.  Do not have throw rugs and other things on the floor that can make you trip. What can I do with my stairs?  Do not leave any items on the stairs.  Make sure that there are handrails on both sides of the stairs and use them. Fix handrails that are broken or loose. Make sure that handrails are as long as the stairways.  Check any carpeting to make sure that  it is firmly attached to the stairs. Fix any carpet that is loose or worn.  Avoid having throw rugs at the top or bottom of the stairs. If you do have throw rugs, attach them to the floor with carpet tape.  Make sure that you have a light switch at the top of the stairs and the bottom of the stairs. If you do not have them, ask someone to add them for you. What else can I do to help prevent falls?  Wear shoes that:  Do not have high heels.  Have rubber bottoms.  Are comfortable and fit you well.  Are closed at the toe. Do not wear sandals.  If you use a stepladder:  Make sure that it is fully opened. Do not climb a closed stepladder.  Make sure that both sides of the stepladder are locked into place.  Ask someone to hold it for you, if possible.  Clearly mark and make sure that you can see:  Any grab bars or handrails.  First and last steps.  Where the edge of each step is.  Use tools that help you move around (mobility aids) if they are needed. These include:  Canes.  Walkers.  Scooters.  Crutches.  Turn on the lights when you go into a dark area. Replace any light bulbs as soon as they burn out.  Set up your furniture so you have a clear path. Avoid moving your furniture around.  If any of your floors are uneven, fix them.  If there are any pets around you, be aware of where they are.  Review your medicines with your doctor. Some medicines can make you feel dizzy. This can increase your chance of falling. Ask your doctor what other things that you can do to help prevent falls. This information is not intended to replace advice given to you by your health care provider. Make sure you discuss any questions you have with your health care provider. Document Released: 05/25/2009 Document Revised: 01/04/2016 Document Reviewed: 09/02/2014 Elsevier Interactive Patient Education  2017 [employer].

## 2017-06-27 NOTE — Progress Notes (Signed)
Subjective:   Amy [name] is a 66 y.o. female who presents for an Initial Medicare Annual Wellness Visit.  Review of Systems    N/A  Cardiac Risk Factors include: hypertension;dyslipidemia     Objective:    Today's Vitals   06/27/17 1326 06/27/17 1333  BP: 140/88   Pulse: 94   Temp: 98.1 F (36.7 C)   TempSrc: Oral   SpO2: 96%   Weight: 152 lb 12 oz (69.3 kg)   Height: 5\' 5"  (1.651 m)   PainSc:  5    Body mass index is 25.42 kg/m.   Current Medications (verified) Outpatient Encounter Medications as of 06/27/2017  Medication Sig  . ALPRAZolam (XANAX) 0.25 MG tablet Take 1 tablet (0.25 mg total) 2 (two) times daily as needed by mouth.  . citalopram (CELEXA) 40 MG tablet TAKE 1 TABLET BY MOUTH EVERY DAY  . HYDROcodone-acetaminophen (NORCO/VICODIN) 5-325 MG tablet   . ipratropium (ATROVENT) 0.06 % nasal spray Place 2 sprays 4 (four) times daily into both nostrils.  . lansoprazole (PREVACID) 15 MG capsule Take 30 mg by mouth daily at 12 noon. Reported on 01/02/2016  . lisinopril (PRINIVIL,ZESTRIL) 20 MG tablet Take 1 tablet (20 mg total) by mouth daily.  . metoprolol succinate (TOPROL-XL) 100 MG 24 hr tablet TAKE 1 TABLET (100 MG TOTAL) BY MOUTH DAILY. TAKE WITH OR IMMEDIATELY FOLLOWING A MEAL.  Marland Kitchen zolpidem (AMBIEN) 10 MG tablet Take 1 tablet (10 mg total) at bedtime by mouth.   No facility-administered encounter medications on file as of 06/27/2017.     Allergies (verified) Codeine; Darvocet [propoxyphene n-acetaminophen]; Tramadol; Gold-containing drug products; Meperidine; Morphine; Verapamil; and Sulfa antibiotics   History: Past Medical History:  Diagnosis Date  . Allergy   . 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 Anxiety    "sometimes" (09/23/2014)  . Cancer of left breast ([place]) 08/2014   left upper outer  . Cerebral meningioma ([place]) 09/2012   auditory; s/p excision  . Chronic lower back pain   . Chronic pain in left foot   .  Deafness in left ear   . Facial droop    "left side; from brain tumor OR"  . GERD (gastroesophageal reflux disease)   . History of chemotherapy    last treatment 03/02/2015  . History of hiatal hernia   . Hypertension   . Migraine    "before brain tumor was removed" (09/23/2014)  . Personal history of chemotherapy 09/12/2014  . PONV (postoperative nausea and vomiting)    "severe"  . Stress incontinence    Past Surgical History:  Procedure Laterality Date  . AUGMENTATION MAMMAPLASTY Bilateral 11/11/2015  . BACK SURGERY    . BRAIN SURGERY  2014   cerebellopontine angle tumor with craniotomy   . BREAST BIOPSY Left 08/2014  . BREAST RECONSTRUCTION Left 05/18/2015  . COLONOSCOPY    . FOOT FRACTURE SURGERY Left 1990's  . FOOT SURGERY Left    "one the muscle; trying to roll foot inward after failed recovery S/P fracture"  . FRACTURE SURGERY    . INSERTION PORT-A-CATH RIGHT SUBCLAVIAN Right 09/23/2014   Performed by Fanny Skates, MD at [facility] ARTHROSCOPY Left   . LEFT BREAST RECONSTRUCTION WITH PLACEMENT OF TISSUE EXPANDER AND ACELLULAR HYDRATED DERMIS MATRIX Left 05/18/2015   Performed by [name], MD at [facility]  . LEFT TOTAL MASTECTOMY WITH LEFT AXILLARY SENTINEL LYMPH NODE  BIOPSY Left 09/23/2014   Performed by Fanny Skates, MD at [place]  . MASTECTOMY COMPLETE / SIMPLE W/ SENTINEL NODE BIOPSY Left 09/23/2014  . MULTIPLE TOOTH EXTRACTIONS    . PARTIAL KNEE ARTHROPLASTY Left 2000's    Dr. Sharol Given  . PATELLA TENDON REPAIR Right 05/27/2012   Performed by Newt Minion, MD at [facility]  . PORTACATH PLACEMENT Right 09/23/2014  . POSTERIOR LUMBAR FUSION  02/1988   L2 reconstruction; fusion  . REMOVAL PORT-A-CATH Right 03/16/2015   Performed by Fanny Skates, MD at [facility]  . TOTAL ABDOMINAL HYSTERECTOMY  12/1976  . TOTAL KNEE ARTHROPLASTY Right 05/24/2009    Dr. Sharol Given  . UPPER GASTROINTESTINAL ENDOSCOPY    . WISDOM TOOTH EXTRACTION     Family History    Adopted: Yes  Problem Relation Age of Onset  . Hypertension Father    Social History   Occupational History  . Occupation: disability    Comment: HVAC  Tobacco Use  . Smoking status: Former Smoker    Packs/day: 0.25    Years: 20.00    Pack years: 5.00    Types: Cigarettes    Last attempt to quit: 05/22/1995    Years since quitting: 22.1  . Smokeless tobacco: Never Used  Substance and Sexual Activity  . Alcohol use: No  . Drug use: No  . Sexual activity: Yes    Partners: Male    Birth control/protection: None    Comment: menarche age 43, P 0 ,HRT x 42 yrs    Tobacco Counseling Counseling given: Not Answered   Activities of Daily Living In your present state of health, do you have any difficulty performing the following activities: 06/27/2017  Hearing? Y  Comment Patient is deaf in her left ear due to a brain tumor  Vision? N  Difficulty concentrating or making decisions? N  Walking or climbing stairs? Y  Comment Patient has knee pain.  Dressing or bathing? N  Doing errands, shopping? N  Preparing Food and eating ? N  Using the Toilet? N  In the past six months, have you accidently leaked urine? N  Do you have problems with loss of bowel control? N  Managing your Medications? N  Managing your Finances? N  Housekeeping or managing your Housekeeping? N  Some recent data might be hidden    Immunizations and Health Maintenance Immunization History  Administered Date(s) Administered  . Influenza Split 08/29/2011, 05/28/2012, 08/28/2015  . Influenza, Seasonal, Injecte, Preservative Fre 05/22/2014  . Influenza,inj,Quad PF,6+ Mos 07/01/2013, 04/17/2016  . Influenza,inj,quad, With Preservative 04/17/2016  . Influenza-Unspecified 05/12/2014, 05/12/2016, 04/20/2017  . Pneumococcal Conjugate-13 02/24/2017  . Td 05/12/2009   There are no preventive care reminders to display for this patient.  Patient Care Team: [name], PA-C as PCP - General (Family  Medicine) [name], MD as Consulting Physician (Plastic Surgery) Lina Sar, MD as Referring Physician (Otolaryngology) Newt Minion, MD as Consulting Physician (Orthopedic Surgery)  Indicate any recent Medical Services you may have received from other than [facility] providers in the past year (date may be approximate).     Assessment:   This is a routine wellness examination for Amy [name].   Hearing/Vision screen Hearing Screening Comments: Patient has had a hearing exam in the past. 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  Vision Screening Comments: Patient sees her eye doctor yearly for her routine eye exams.   Dietary issues and exercise activities discussed: Current Exercise Habits: The patient does not participate  in regular exercise at present, Exercise limited by: None identified  Goals    . increase social health     Patient would like to try to improve her social life and start vacationing more and doing fun activities.      Depression Screen PHQ 2/9 Scores 06/27/2017 06/27/2017 04/08/2017 12/24/2016 06/25/2016 01/02/2016 10/13/2014  PHQ - 2 Score 0 0 0 0 0 0 0  PHQ- 9 Score 3 - - - - - -    Fall Risk Fall Risk  06/27/2017 06/27/2017 04/08/2017 12/24/2016 06/25/2016  Falls in the past year? Yes No No No No    Cognitive Function:     6CIT Screen 06/27/2017  What Year? 0 points  What month? 0 points  What time? 0 points  Count back from 20 0 points  Months in reverse 0 points  Repeat phrase 2 points  Total Score 2    Screening Tests Health Maintenance  Topic Date Due  . MAMMOGRAM  10/04/2018  . TETANUS/TDAP  05/13/2019  . COLONOSCOPY  01/27/2022  . INFLUENZA VACCINE  Completed  . Hepatitis C Screening  Completed  . HIV Screening  Completed      Plan:   I have personally reviewed and noted the following in the patient's chart:   . Medical and social history . Use of alcohol, tobacco or illicit drugs  . Current medications and  supplements . Functional ability and status . Nutritional status . Physical activity . Advanced directives . List of other physicians . Hospitalizations, surgeries, and ER visits in previous 12 months . Vitals . Screenings to include cognitive, depression, and falls . Referrals and appointments  In addition, I have reviewed and discussed with patient certain preventive protocols, quality metrics, and best practice recommendations. A written personalized care plan for preventive services as well as general preventive health recommendations were provided to patient.   1. Encounter for Medicare annual wellness exam    Andrez Grime, LPN   03/14/2335

## 2017-06-28 LAB — CBC WITH DIFFERENTIAL/PLATELET
BASOS ABS: 0 10*3/uL (ref 0.0–0.2)
Basos: 1 %
EOS (ABSOLUTE): 0.2 10*3/uL (ref 0.0–0.4)
Eos: 3 %
HEMATOCRIT: 41.6 % (ref 34.0–46.6)
HEMOGLOBIN: 13.3 g/dL (ref 11.1–15.9)
Immature Grans (Abs): 0 10*3/uL (ref 0.0–0.1)
Immature Granulocytes: 0 %
LYMPHS ABS: 2.4 10*3/uL (ref 0.7–3.1)
Lymphs: 41 %
MCH: 28.3 pg (ref 26.6–33.0)
MCHC: 32 g/dL (ref 31.5–35.7)
MCV: 89 fL (ref 79–97)
MONOCYTES: 9 %
Monocytes Absolute: 0.5 10*3/uL (ref 0.1–0.9)
NEUTROS ABS: 2.7 10*3/uL (ref 1.4–7.0)
Neutrophils: 46 %
Platelets: 488 10*3/uL — ABNORMAL HIGH (ref 150–379)
RBC: 4.7 x10E6/uL (ref 3.77–5.28)
RDW: 14.1 % (ref 12.3–15.4)
WBC: 5.9 10*3/uL (ref 3.4–10.8)

## 2017-06-28 LAB — LIPID PANEL
CHOL/HDL RATIO: 3.9 ratio (ref 0.0–4.4)
Cholesterol, Total: 197 mg/dL (ref 100–199)
HDL: 51 mg/dL (ref >39)
LDL Calculated: 122 mg/dL — ABNORMAL HIGH (ref 0–99)
TRIGLYCERIDES: 121 mg/dL (ref 0–149)
VLDL Cholesterol Cal: 24 mg/dL (ref 5–40)

## 2017-06-28 LAB — COMPREHENSIVE METABOLIC PANEL
A/G RATIO: 2.5 — AB (ref 1.2–2.2)
ALT: 11 IU/L (ref 0–32)
AST: 17 IU/L (ref 0–40)
Albumin: 4.5 g/dL (ref 3.6–4.8)
Alkaline Phosphatase: 59 IU/L (ref 39–117)
BUN/Creatinine Ratio: 13 (ref 12–28)
BUN: 9 mg/dL (ref 8–27)
Bilirubin Total: 0.4 mg/dL (ref 0.0–1.2)
CALCIUM: 9.6 mg/dL (ref 8.7–10.3)
CO2: 25 mmol/L (ref 20–29)
CREATININE: 0.71 mg/dL (ref 0.57–1.00)
Chloride: 99 mmol/L (ref 96–106)
GFR calc Af Amer: 104 mL/min/{1.73_m2} (ref >59)
GFR, EST NON AFRICAN AMERICAN: 90 mL/min/{1.73_m2} (ref >59)
GLUCOSE: 94 mg/dL (ref 65–99)
Globulin, Total: 1.8 g/dL (ref 1.5–4.5)
POTASSIUM: 4.6 mmol/L (ref 3.5–5.2)
Sodium: 137 mmol/L (ref 134–144)
Total Protein: 6.3 g/dL (ref 6.0–8.5)

## 2017-06-28 LAB — HEMOGLOBIN A1C
Est. average glucose Bld gHb Est-mCnc: 126 mg/dL
Hgb A1c MFr Bld: 6 % — ABNORMAL HIGH (ref 4.8–5.6)

## 2017-06-28 LAB — TSH: TSH: 0.962 u[IU]/mL (ref 0.450–4.500)

## 2017-07-11 DIAGNOSIS — J300 Vasomotor rhinitis: Secondary | ICD-10-CM | POA: Diagnosis not present

## 2017-07-21 ENCOUNTER — Other Ambulatory Visit: Payer: Self-pay | Admitting: Physician Assistant

## 2017-07-21 DIAGNOSIS — F32A Depression, unspecified: Secondary | ICD-10-CM

## 2017-07-21 DIAGNOSIS — F329 Major depressive disorder, single episode, unspecified: Secondary | ICD-10-CM

## 2017-07-21 DIAGNOSIS — G4700 Insomnia, unspecified: Secondary | ICD-10-CM

## 2017-07-21 DIAGNOSIS — F419 Anxiety disorder, unspecified: Secondary | ICD-10-CM

## 2017-07-23 MED ORDER — ALPRAZOLAM 0.25 MG PO TABS: 0.2500 mg | ORAL_TABLET | Freq: Two times a day (BID) | ORAL | 0 refills | Status: DC | PRN

## 2017-07-23 MED ORDER — ZOLPIDEM TARTRATE 10 MG PO TABS: 10.0000 mg | ORAL_TABLET | Freq: Every day | ORAL | 0 refills | Status: DC

## 2017-07-23 NOTE — Telephone Encounter (Signed)
Rx sent electronically.  Meds ordered this encounter  Medications  . ALPRAZolam (XANAX) 0.25 MG tablet    Sig: Take 1 tablet (0.25 mg total) by mouth 2 (two) times daily as needed.    Dispense:  60 tablet    Refill:  0    Not to exceed 5 additional fills before 06/04/2017  . zolpidem (AMBIEN) 10 MG tablet    Sig: Take 1 tablet (10 mg total) by mouth at bedtime.    Dispense:  30 tablet    Refill:  0    Not to exceed 5 additional fills before 05/29/2017

## 2017-07-24 NOTE — Telephone Encounter (Signed)
Prescription picked up confirmed with pharmacy

## 2017-07-25 DIAGNOSIS — M7752 Other enthesopathy of left foot: Secondary | ICD-10-CM | POA: Diagnosis not present

## 2017-07-25 DIAGNOSIS — M25572 Pain in left ankle and joints of left foot: Secondary | ICD-10-CM | POA: Diagnosis not present

## 2017-08-14 DIAGNOSIS — L814 Other melanin hyperpigmentation: Secondary | ICD-10-CM | POA: Diagnosis not present

## 2017-08-14 DIAGNOSIS — L820 Inflamed seborrheic keratosis: Secondary | ICD-10-CM | POA: Diagnosis not present

## 2017-08-14 DIAGNOSIS — Z86018 Personal history of other benign neoplasm: Secondary | ICD-10-CM | POA: Diagnosis not present

## 2017-08-14 DIAGNOSIS — D1801 Hemangioma of skin and subcutaneous tissue: Secondary | ICD-10-CM | POA: Diagnosis not present

## 2017-08-14 DIAGNOSIS — D485 Neoplasm of uncertain behavior of skin: Secondary | ICD-10-CM | POA: Diagnosis not present

## 2017-08-14 DIAGNOSIS — L738 Other specified follicular disorders: Secondary | ICD-10-CM | POA: Diagnosis not present

## 2017-08-15 DIAGNOSIS — G5761 Lesion of plantar nerve, right lower limb: Secondary | ICD-10-CM | POA: Diagnosis not present

## 2017-08-19 ENCOUNTER — Other Ambulatory Visit: Payer: Self-pay | Admitting: Physician Assistant

## 2017-08-19 ENCOUNTER — Ambulatory Visit (INDEPENDENT_AMBULATORY_CARE_PROVIDER_SITE_OTHER): Payer: Medicare Other | Admitting: Orthopedic Surgery

## 2017-08-19 DIAGNOSIS — F32A Depression, unspecified: Secondary | ICD-10-CM

## 2017-08-19 DIAGNOSIS — F419 Anxiety disorder, unspecified: Secondary | ICD-10-CM

## 2017-08-19 DIAGNOSIS — G4700 Insomnia, unspecified: Secondary | ICD-10-CM

## 2017-08-19 DIAGNOSIS — F329 Major depressive disorder, single episode, unspecified: Secondary | ICD-10-CM

## 2017-08-19 NOTE — Telephone Encounter (Signed)
Please advise 

## 2017-08-22 MED ORDER — ALPRAZOLAM 0.25 MG PO TABS: 0.2500 mg | ORAL_TABLET | Freq: Two times a day (BID) | ORAL | 0 refills | Status: DC | PRN

## 2017-08-22 MED ORDER — ZOLPIDEM TARTRATE 10 MG PO TABS: 10.0000 mg | ORAL_TABLET | Freq: Every day | ORAL | 0 refills | Status: DC

## 2017-08-22 NOTE — Telephone Encounter (Signed)
Pt calling about med refill. She is out. cb # [phone]

## 2017-08-22 NOTE — Telephone Encounter (Signed)
Refill req for Ambien, Xanax sent to [facility]

## 2017-08-22 NOTE — Telephone Encounter (Signed)
Requesting refill of Xanax and Ambien  LOV 06/27/17 with Darlin Coco PA-C

## 2017-08-22 NOTE — Telephone Encounter (Signed)
Rx sent electronically.  Meds ordered this encounter  Medications  . ALPRAZolam (XANAX) 0.25 MG tablet    Sig: Take 1 tablet (0.25 mg total) by mouth 2 (two) times daily as needed.    Dispense:  60 tablet    Refill:  0    Not to exceed 5 additional fills before 06/04/2017  . zolpidem (AMBIEN) 10 MG tablet    Sig: Take 1 tablet (10 mg total) by mouth at bedtime.    Dispense:  30 tablet    Refill:  0    Not to exceed 5 additional fills before 05/29/2017

## 2017-08-25 ENCOUNTER — Ambulatory Visit (INDEPENDENT_AMBULATORY_CARE_PROVIDER_SITE_OTHER): Payer: Medicare Other | Admitting: Orthopedic Surgery

## 2017-08-28 DIAGNOSIS — G5762 Lesion of plantar nerve, left lower limb: Secondary | ICD-10-CM | POA: Diagnosis not present

## 2017-09-08 ENCOUNTER — Ambulatory Visit (INDEPENDENT_AMBULATORY_CARE_PROVIDER_SITE_OTHER): Payer: Managed Care, Other (non HMO) | Admitting: Orthopedic Surgery

## 2017-09-08 ENCOUNTER — Ambulatory Visit (INDEPENDENT_AMBULATORY_CARE_PROVIDER_SITE_OTHER): Payer: Managed Care, Other (non HMO)

## 2017-09-08 ENCOUNTER — Encounter (INDEPENDENT_AMBULATORY_CARE_PROVIDER_SITE_OTHER): Payer: Self-pay | Admitting: Orthopedic Surgery

## 2017-09-08 VITALS — Ht 65.0 in | Wt 152.0 lb

## 2017-09-08 DIAGNOSIS — G8929 Other chronic pain: Secondary | ICD-10-CM | POA: Diagnosis not present

## 2017-09-08 DIAGNOSIS — M25561 Pain in right knee: Secondary | ICD-10-CM

## 2017-09-08 DIAGNOSIS — S86891S Other injury of other muscle(s) and tendon(s) at lower leg level, right leg, sequela: Secondary | ICD-10-CM | POA: Diagnosis not present

## 2017-09-08 NOTE — Progress Notes (Signed)
Office Visit Note   Patient: Amy [name]           Date of Birth: 1950-11-14           MRN: [id number] Visit Date: 09/08/2017              Requested by: [name], PA-C [address] [place], [place] [address] PCP: [name], PA-C  Chief Complaint  Patient presents with  . Right Knee - Pain    Hx patella tendon repair 2013      HPI: Patient is a 66 year old woman who is status post unicompartmental arthroplasty of the left knee total knee arthroplasty of the right knee.  In 2013 she had a rupture of the patella tendon this underwent reconstruction.  Patient states that in about 2014 she had superior migration of the patella.  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  Patient states she also had breast cancer and had mastectomy as well as breast reconstructions she states that she is concerned with the alignment of her patella.  Assessment & Plan: Visit Diagnoses:  1. Chronic pain of right knee   2. Patellar tendon avulsion, right, sequela     Plan: Patient has full active extension of her knee I cannot improve this chronic condition of the superior migration of the patella.  Her patella tendon is functional.  Recommended continue strengthening continue activities recommended using 2 walking sticks for assistance with hiking recommend against horseback riding discussed that since she wants to ride a motorcycle she would be safer in a 3 wheeled motorcycle.  Do not feel that surgical intervention could help with her range of motion at this time.  Follow-Up Instructions: Return if symptoms worsen or fail to improve.   Ortho Exam  Patient is alert, oriented, no adenopathy, well-dressed, normal affect, normal respiratory effort. Examination patient has a normal gait.  She has superior migration of the toe on the right she has full active extension with her knee  fully extended despite the superior migration of the patella.  Varus and valgus stress is stable.  There is no redness no cellulitis no effusion no signs of infection.  Left knee has a stable medial unicompartmental arthroplasty and she is asymptomatic.  Imaging: No results found. No images are attached to the encounter.  Labs: Lab Results  Component Value Date   HGBA1C 6.0 (H) 06/27/2017   HGBA1C 5.5 08/16/2014   HGBA1C 5.3 01/06/2014    @LABSALLVALUES (HGBA1)@  Body mass index is 25.29 kg/m.  Orders:  Orders Placed This Encounter  Procedures  . XR KNEE 3 VIEW RIGHT   No orders of the defined types were placed in this encounter.    Procedures: No procedures performed  Clinical Data: No additional findings.  ROS:  All other systems negative, except as noted in the HPI. Review of Systems  Objective: Vital Signs: Ht 5\' 5"  (1.651 m)   Wt 152 lb (68.9 kg)   BMI 25.29 kg/m   Specialty Comments:  No specialty comments available.  PMFS History: Patient Active Problem List   Diagnosis Date Noted  . Hyperglycemia 04/11/2017  . Dislocated patella 02/21/2017  . Trochanteric bursitis, right hip 09/24/2016  . Myofascitis 09/24/2016  . Post laminectomy syndrome 09/24/2016  . Chronic pain syndrome 09/24/2016  . Spondylosis without myelopathy or radiculopathy, lumbar region 07/30/2016  . Anxiety and depression 01/02/2016  . Hx of  breast reconstruction 09/27/2015  . Breast cancer of upper-outer quadrant of left female breast ([facility]) 09/09/2014  . HTN (hypertension) 12/24/2012  . GERD (gastroesophageal reflux disease) 12/24/2012  . Hearing loss in left ear 12/24/2012  . 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 Insomnia 12/24/2012  . Diverticulosis 12/24/2012  . Personal history of colonic polyps 12/24/2012  . Hyperlipidemia 12/24/2012  . BCC (basal cell carcinoma), arm 12/24/2012   Past Medical History:  Diagnosis Date  . Allergy   . 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 Anxiety    "sometimes" (09/23/2014)  . Cancer of left breast ([facility]) 08/2014   left upper outer  . Cerebral meningioma ([facility]) 09/2012   auditory; s/p excision  . Chronic lower back pain   . Chronic pain in left foot   . Deafness in left ear   . Facial droop    "left side; from brain tumor OR"  . GERD (gastroesophageal reflux disease)   . History of chemotherapy    last treatment 03/02/2015  . History of hiatal hernia   . Hypertension   . Migraine    "before brain tumor was removed" (09/23/2014)  . Personal history of chemotherapy 09/12/2014  . PONV (postoperative nausea and vomiting)    "severe"  . Stress incontinence     Family History  Adopted: Yes  Problem Relation Age of Onset  . Hypertension Father     Past Surgical History:  Procedure Laterality Date  . AUGMENTATION MAMMAPLASTY Bilateral 11/11/2015  . BACK SURGERY    . BRAIN SURGERY  2014   cerebellopontine angle tumor with craniotomy   . BREAST BIOPSY Left 08/2014  . BREAST RECONSTRUCTION Left 05/18/2015  . BREAST RECONSTRUCTION WITH PLACEMENT OF TISSUE EXPANDER AND FLEX HD (ACELLULAR HYDRATED DERMIS) Left 05/18/2015   Procedure: LEFT BREAST RECONSTRUCTION WITH PLACEMENT OF TISSUE EXPANDER AND ACELLULAR HYDRATED DERMIS MATRIX;  Surgeon: [name], MD;  Location: [facility];  Service: Plastics;  Laterality: Left;  . COLONOSCOPY    . FOOT FRACTURE SURGERY Left 1990's  . FOOT SURGERY Left    "one the muscle; trying to roll foot inward after failed recovery S/P fracture"  . FRACTURE SURGERY    . JOINT REPLACEMENT    . KNEE ARTHROSCOPY Left   . MASTECTOMY COMPLETE / SIMPLE W/ SENTINEL NODE BIOPSY Left 09/23/2014  . MULTIPLE TOOTH EXTRACTIONS    . PARTIAL KNEE ARTHROPLASTY Left 2000's    Dr. Sharol Given  . PATELLAR TENDON REPAIR  05/27/2012   Procedure: PATELLA TENDON REPAIR;  Surgeon: Newt Minion, MD;  Location: [facility];  Service: Orthopedics;  Laterality: Right;  Right Patella Tendon  Reconstruction with  Semi Tendonosum  . PORT-A-CATH REMOVAL Right 03/16/2015   Procedure: REMOVAL PORT-A-CATH;  Surgeon: Fanny Skates, MD;  Location: [facility];  Service: General;  Laterality: Right;  . PORTACATH PLACEMENT Right 09/23/2014  . PORTACATH PLACEMENT Right 09/23/2014   Procedure: INSERTION PORT-A-CATH RIGHT SUBCLAVIAN;  Surgeon: Fanny Skates, MD;  Location: [facility];  Service: General;  Laterality: Right;  . POSTERIOR LUMBAR FUSION  02/1988   L2 reconstruction; fusion  . SIMPLE MASTECTOMY WITH AXILLARY SENTINEL NODE BIOPSY Left 09/23/2014   Procedure: LEFT TOTAL MASTECTOMY WITH LEFT AXILLARY SENTINEL LYMPH NODE BIOPSY;  Surgeon: Fanny Skates, MD;  Location: [facility];  Service: General;  Laterality: Left;  . TOTAL ABDOMINAL HYSTERECTOMY  12/1976  . TOTAL KNEE ARTHROPLASTY Right 05/24/2009    Dr. Sharol Given  . UPPER GASTROINTESTINAL ENDOSCOPY    .  WISDOM TOOTH EXTRACTION     Social History   Occupational History  . Occupation: disability    Comment: HVAC  Tobacco Use  . Smoking status: Former Smoker    Packs/day: 0.25    Years: 20.00    Pack years: 5.00    Types: Cigarettes    Last attempt to quit: 05/22/1995    Years since quitting: 22.3  . Smokeless tobacco: Never Used  Substance and Sexual Activity  . Alcohol use: No  . Drug use: No  . Sexual activity: Yes    Partners: Male    Birth control/protection: None    Comment: menarche age 82, P 0 ,HRT x 42 yrs

## 2017-09-09 ENCOUNTER — Telehealth (INDEPENDENT_AMBULATORY_CARE_PROVIDER_SITE_OTHER): Payer: Self-pay | Admitting: Orthopedic Surgery

## 2017-09-09 ENCOUNTER — Other Ambulatory Visit (INDEPENDENT_AMBULATORY_CARE_PROVIDER_SITE_OTHER): Payer: Self-pay | Admitting: Orthopedic Surgery

## 2017-09-09 MED ORDER — METHOCARBAMOL 500 MG PO TABS: 500.0000 mg | ORAL_TABLET | Freq: Three times a day (TID) | ORAL | 0 refills | Status: DC

## 2017-09-09 NOTE — Telephone Encounter (Signed)
Patient called asked if she need a brace for her knee in order to keep dancing? The number to contact patient is [phone]

## 2017-09-09 NOTE — Telephone Encounter (Signed)
Patient is requesting RX refill on medication Dr. Sharol Given has given her for muscle spasms in the past. Shes not sure of the name. [facility] [place]. Please advise  # [phone]

## 2017-09-09 NOTE — Telephone Encounter (Signed)
rx written

## 2017-09-09 NOTE — Telephone Encounter (Signed)
Patient requested a muscle relaxer and was told no because she takes Xanax. Patient stated that she only takes the Xanax on an as needed basis.  Please call patient back to advise

## 2017-09-09 NOTE — Telephone Encounter (Signed)
There are no muscle relaxants in her medical history on file here, she does have a rx on file for xanax and this is in the same family as muscle relaxants, and both should not be taken together

## 2017-09-09 NOTE — Telephone Encounter (Signed)
I called and left patient voicemail if she is taking xanax Dr. Sharol Given cannot prescribe muscle relaxer. The two together would be dangerous.

## 2017-09-10 MED ORDER — METHOCARBAMOL 500 MG PO TABS: 500.0000 mg | ORAL_TABLET | Freq: Three times a day (TID) | ORAL | 0 refills | Status: DC

## 2017-09-10 NOTE — Telephone Encounter (Signed)
rx for robaxin sent in to patients pharmacy. Pending call back, once we hear back about another previous message she left, so we can give her all this information at once.

## 2017-09-10 NOTE — Telephone Encounter (Signed)
I called and left vm to advise rx for muscle relaxer was sent into her pharmacy. Advised not to take with her xanax. Also advised of message below advised OTC knee brace for comfort functionally it would not be able to offer her stability it would be strictly for comfort.

## 2017-09-10 NOTE — Telephone Encounter (Signed)
She could wear an over-the-counter knee brace that she could obtain from a medical supply store if this feels comfortable.  Structurally a brace would not help with her knee stability but may improve her comfort.

## 2017-09-17 ENCOUNTER — Other Ambulatory Visit: Payer: Self-pay | Admitting: Physician Assistant

## 2017-09-17 DIAGNOSIS — F419 Anxiety disorder, unspecified: Secondary | ICD-10-CM

## 2017-09-17 DIAGNOSIS — G4700 Insomnia, unspecified: Secondary | ICD-10-CM

## 2017-09-17 DIAGNOSIS — F329 Major depressive disorder, single episode, unspecified: Secondary | ICD-10-CM

## 2017-09-17 NOTE — Telephone Encounter (Signed)
Please advise 

## 2017-09-19 ENCOUNTER — Telehealth: Payer: Self-pay | Admitting: Physician Assistant

## 2017-09-19 ENCOUNTER — Other Ambulatory Visit: Payer: Self-pay | Admitting: Physician Assistant

## 2017-09-19 DIAGNOSIS — F329 Major depressive disorder, single episode, unspecified: Secondary | ICD-10-CM

## 2017-09-19 DIAGNOSIS — F419 Anxiety disorder, unspecified: Secondary | ICD-10-CM

## 2017-09-19 DIAGNOSIS — G4700 Insomnia, unspecified: Secondary | ICD-10-CM

## 2017-09-19 MED ORDER — ALPRAZOLAM 0.25 MG PO TABS: 0.2500 mg | ORAL_TABLET | Freq: Two times a day (BID) | ORAL | 0 refills | Status: DC | PRN

## 2017-09-19 MED ORDER — ZOLPIDEM TARTRATE 10 MG PO TABS: 10.0000 mg | ORAL_TABLET | Freq: Every day | ORAL | 0 refills | Status: DC

## 2017-09-19 NOTE — Telephone Encounter (Signed)
PT called to check on status of refill

## 2017-09-19 NOTE — Telephone Encounter (Signed)
Rx sent electronically.  Meds ordered this encounter  Medications  . ALPRAZolam (XANAX) 0.25 MG tablet    Sig: Take 1 tablet (0.25 mg total) by mouth 2 (two) times daily as needed.    Dispense:  60 tablet    Refill:  0    Not to exceed 5 additional fills before 06/04/2017  . zolpidem (AMBIEN) 10 MG tablet    Sig: Take 1 tablet (10 mg total) by mouth at bedtime.    Dispense:  30 tablet    Refill:  0    Not to exceed 5 additional fills before 05/29/2017   Recommend against opening pill bottles over the sink.

## 2017-09-19 NOTE — Telephone Encounter (Signed)
Copied from [place]. Topic: Quick Communication - Rx Refill/Question >> Sep 19, 2017  9:21 AM Sandi Mariscal E, NT wrote: Medication: zolpidem (AMBIEN) 10 MG tablet and ALPRAZolam (XANAX) 0.25 MG tablet   Has the patient contacted their pharmacy? Yes   (Agent: If no, request that the patient contact the pharmacy for the refill.)  [id number] [place], [place] [phone] (Phone) [phone] (Fax)   Preferred Pharmacy (with phone number or street name):    Agent: Please be advised that RX refills may take up to 3 business days. We ask that you follow-up with your pharmacy.

## 2017-09-19 NOTE — Telephone Encounter (Signed)
Pended orders already In system waiting for authorization

## 2017-09-19 NOTE — Telephone Encounter (Signed)
Zolpidem and alprazolam   Med refill Last OV:   06/27/17 Last Refill:  08/22/17 for both [facility] [place]

## 2017-09-19 NOTE — Telephone Encounter (Signed)
Pt is out of zolpidem, she dropped her last few in the sink and they got wet

## 2017-09-22 NOTE — Telephone Encounter (Signed)
Ambien & Xanax refill requests-controlled substances Last OV 06/27/17 [id number] [place]

## 2017-09-24 NOTE — Telephone Encounter (Signed)
This was done on 09/19/2017 at 6:52 pm

## 2017-10-09 ENCOUNTER — Other Ambulatory Visit: Payer: Self-pay | Admitting: Physician Assistant

## 2017-10-09 DIAGNOSIS — Z1231 Encounter for screening mammogram for malignant neoplasm of breast: Secondary | ICD-10-CM

## 2017-10-15 ENCOUNTER — Other Ambulatory Visit: Payer: Self-pay | Admitting: Physician Assistant

## 2017-10-15 DIAGNOSIS — F329 Major depressive disorder, single episode, unspecified: Secondary | ICD-10-CM

## 2017-10-15 DIAGNOSIS — G4700 Insomnia, unspecified: Secondary | ICD-10-CM

## 2017-10-15 DIAGNOSIS — F419 Anxiety disorder, unspecified: Secondary | ICD-10-CM

## 2017-10-17 MED ORDER — ZOLPIDEM TARTRATE 10 MG PO TABS: 10.0000 mg | ORAL_TABLET | Freq: Every day | ORAL | 0 refills | Status: DC

## 2017-10-17 MED ORDER — ALPRAZOLAM 0.25 MG PO TABS: 0.2500 mg | ORAL_TABLET | Freq: Two times a day (BID) | ORAL | 0 refills | Status: DC | PRN

## 2017-10-17 NOTE — Telephone Encounter (Signed)
Done

## 2017-10-30 ENCOUNTER — Ambulatory Visit
Admission: RE | Admit: 2017-10-30 | Discharge: 2017-10-30 | Disposition: A | Discharge status: Home / Self Care | Payer: Medicare Other | Source: Ambulatory Visit | Attending: Physician Assistant | Admitting: Physician Assistant

## 2017-10-30 ENCOUNTER — Other Ambulatory Visit (INDEPENDENT_AMBULATORY_CARE_PROVIDER_SITE_OTHER): Payer: Self-pay | Admitting: Orthopedic Surgery

## 2017-10-30 DIAGNOSIS — Z1231 Encounter for screening mammogram for malignant neoplasm of breast: Secondary | ICD-10-CM

## 2017-10-31 NOTE — Telephone Encounter (Signed)
Ok to refill 

## 2017-11-12 ENCOUNTER — Other Ambulatory Visit: Payer: Self-pay | Admitting: Physician Assistant

## 2017-11-12 DIAGNOSIS — F419 Anxiety disorder, unspecified: Secondary | ICD-10-CM

## 2017-11-12 DIAGNOSIS — G4700 Insomnia, unspecified: Secondary | ICD-10-CM

## 2017-11-12 DIAGNOSIS — F329 Major depressive disorder, single episode, unspecified: Secondary | ICD-10-CM

## 2017-11-13 MED ORDER — ALPRAZOLAM 0.25 MG PO TABS: 0.2500 mg | ORAL_TABLET | Freq: Two times a day (BID) | ORAL | 0 refills | Status: DC | PRN

## 2017-11-13 MED ORDER — ZOLPIDEM TARTRATE 10 MG PO TABS: 10.0000 mg | ORAL_TABLET | Freq: Every day | ORAL | 0 refills | Status: DC

## 2017-11-17 ENCOUNTER — Encounter: Payer: Self-pay | Admitting: Physician Assistant

## 2017-11-18 ENCOUNTER — Telehealth (INDEPENDENT_AMBULATORY_CARE_PROVIDER_SITE_OTHER): Payer: Self-pay | Admitting: Physical Medicine and Rehabilitation

## 2017-11-18 NOTE — Telephone Encounter (Signed)
Scheduled for 12/02/17.

## 2017-11-18 NOTE — Telephone Encounter (Signed)
Likely ok to do both

## 2017-12-02 ENCOUNTER — Encounter (INDEPENDENT_AMBULATORY_CARE_PROVIDER_SITE_OTHER): Payer: Self-pay | Admitting: Physical Medicine and Rehabilitation

## 2017-12-02 ENCOUNTER — Ambulatory Visit (INDEPENDENT_AMBULATORY_CARE_PROVIDER_SITE_OTHER): Payer: Managed Care, Other (non HMO) | Admitting: Physical Medicine and Rehabilitation

## 2017-12-02 ENCOUNTER — Ambulatory Visit (INDEPENDENT_AMBULATORY_CARE_PROVIDER_SITE_OTHER): Payer: Self-pay

## 2017-12-02 VITALS — BP 137/86 | HR 74 | Temp 98.3°F

## 2017-12-02 DIAGNOSIS — M7061 Trochanteric bursitis, right hip: Secondary | ICD-10-CM | POA: Diagnosis not present

## 2017-12-02 DIAGNOSIS — M47816 Spondylosis without myelopathy or radiculopathy, lumbar region: Secondary | ICD-10-CM | POA: Diagnosis not present

## 2017-12-02 DIAGNOSIS — M25551 Pain in right hip: Secondary | ICD-10-CM | POA: Diagnosis not present

## 2017-12-02 DIAGNOSIS — M961 Postlaminectomy syndrome, not elsewhere classified: Secondary | ICD-10-CM | POA: Diagnosis not present

## 2017-12-02 DIAGNOSIS — M4125 Other idiopathic scoliosis, thoracolumbar region: Secondary | ICD-10-CM

## 2017-12-02 MED ORDER — METHYLPREDNISOLONE ACETATE 80 MG/ML IJ SUSP: 80.0000 mg | Freq: Once | INTRAMUSCULAR | Status: DC

## 2017-12-02 NOTE — Patient Instructions (Signed)

## 2017-12-02 NOTE — Progress Notes (Signed)
 .  Numeric Pain Rating Scale and Functional Assessment Average Pain 6   In the last MONTH (on 0-10 scale) has pain interfered with the following?  1. General activity like being  able to carry out your everyday physical activities such as walking, climbing stairs, carrying groceries, or moving a chair?  Rating(2)   +Driver, -BT, -Dye Allergies.

## 2017-12-02 NOTE — Progress Notes (Signed)
TAJANA CROTTEAU - 67 y.o. female MRN [id number]  Date of birth: [birth year]  Office Visit Note: Visit Date: 12/02/2017 PCP: [name], PA-C Referred by: [name], PA-C  Subjective: Chief Complaint  Patient presents with  . Lower Back - Pain   HPI: Patient is a 67 year old female whose history is well-documented in our notes.  We see her once every 4 to 6 months for chronic low back pain and hip pain which is mainly been facet mediated pain below her longstem fusion for prior burst fracture as well as greater trochanteric pain syndrome or bursitis.  She has had a long history of problems with her spine as well as history of resected brain tumor and subsequent stroke.  She had a long history of medication type management with narcotics which were never really beneficial.  She had a history of pain over the pelvic harvesting site for her fusion procedure in the spine.  Last time we saw her we completed facet joint block as well as greater trochanteric injection using fluoroscopic guidance to the body habitus.  She comes in today with similar complaints with no new trauma.  She has had no new health concerns.  No radicular complaints down the legs.  She denies any focal weakness.  She reports worsening pain with sitting for a long time and going from sit to stand and with movement.  She does use ibuprofen and a pain cream and this seems to help to a degree.  She rates her pain is a 6 out of 10 but does not seem to let this bother her from a functional standpoint.  She reports her last injections were very beneficial up until this point.   Review of Systems  Constitutional: Negative for chills, fever, malaise/fatigue and weight loss.  HENT: Negative for hearing loss and sinus pain.   Eyes: Negative for blurred vision, double vision and photophobia.  Respiratory: Negative for cough and shortness of breath.   Cardiovascular: Negative for chest pain, palpitations and leg swelling.    Gastrointestinal: Negative for abdominal pain, nausea and vomiting.  Genitourinary: Negative for flank pain.  Musculoskeletal: Positive for back pain and joint pain. Negative for myalgias.  Skin: Negative for itching and rash.  Neurological: Negative for tremors, focal weakness and weakness.  Endo/Heme/Allergies: Negative.   Psychiatric/Behavioral: Negative for depression.  All other systems reviewed and are negative.  Otherwise per HPI.  Assessment & Plan: Visit Diagnoses:  1. Spondylosis without myelopathy or radiculopathy, lumbar region   2. Greater trochanteric bursitis, right   3. Pain in right hip   4. Post laminectomy syndrome   5. Other idiopathic scoliosis, thoracolumbar region     Plan: Findings:  Patient has a long chronic pain history but does exceedingly well for all the problems she has had.  She has had a prior burst fracture and a longstem fusion.  We have completed injections in the past below the fusion she is done well with intra-articular facet joint blocks in this area.  We have talked about radiofrequency ablation in the past but with the injections helping sometimes for as much as 6 months we have not really pursue that and I am not sure that that is the best option for her at this point.  She does have increasing low back pain worse from sit to stand and worse with facet joint loading on exam.  We will go ahead today and complete bilateral facet joint injections below her fusion.  This is L5-S1.  She also has exquisite tenderness over the right greater trochanter.  We discussed foam rolling today as well as physical therapy and massage.  We will go ahead and repeat the greater trochanteric injection today as well.  This is a separate issue from the back.    Meds & Orders:  Meds ordered this encounter  Medications  . methylPREDNISolone acetate (DEPO-MEDROL) injection 80 mg    Orders Placed This Encounter  Procedures  . Facet Injection  . Large Joint Inj: R  greater trochanter  . XR C-ARM NO REPORT    Follow-up: Return if symptoms worsen or fail to improve.   Procedures: Large Joint Inj: R greater trochanter on 12/02/2017 3:04 PM Indications: pain and diagnostic evaluation Details: 22 G 3.5 in needle, fluoroscopy-guided lateral approach  Arthrogram: No  Medications: 4 mL lidocaine 2 %; 4 mL bupivacaine 0.25 %; 40 mg triamcinolone acetonide 40 MG/ML Outcome: tolerated well, no immediate complications  There was excellent flow of contrast outlined the greater trochanteric bursa without vascular uptake. Procedure, treatment alternatives, risks and benefits explained, specific risks discussed. Consent was given by the patient. Immediately prior to procedure a time out was called to verify the correct patient, procedure, equipment, support staff and site/side marked as required. Patient was prepped and draped in the usual sterile fashion.      Lumbar Facet Joint Intra-Articular Injection(s) with Fluoroscopic Guidance  Patient: ANADALAY MACDONELL      Date of Birth: 1951-05-07 MRN: [id number] PCP: [name], PA-C      Visit Date: 12/02/2017   Universal Protocol:    Date/Time: 12/02/2017  Consent Given By: the patient  Position: PRONE   Additional Comments: Vital signs were monitored before and after the procedure. Patient was prepped and draped in the usual sterile fashion. The correct patient, procedure, and site was verified.   Injection Procedure Details:  Procedure Site One Meds Administered:  Meds ordered this encounter  Medications  . methylPREDNISolone acetate (DEPO-MEDROL) injection 80 mg     Laterality: Bilateral  Location/Site:  L5-S1  Needle size: 22 guage  Needle type: Spinal  Needle Placement: Articular  Findings:  -Comments: Excellent flow of contrast producing a partial arthrogram.  Procedure Details: The fluoroscope beam is vertically oriented in AP, and the inferior recess is visualized  beneath the lower pole of the inferior apophyseal process, which represents the target point for needle insertion. When direct visualization is difficult the target point is located at the medial projection of the vertebral pedicle. The region overlying each aforementioned target is locally anesthetized with a 1 to 2 ml. volume of 1% Lidocaine without Epinephrine.   The spinal needle was inserted into each of the above mentioned facet joints using biplanar fluoroscopic guidance. A 0.25 to 0.5 ml. volume of Isovue-250 was injected and a partial facet joint arthrogram was obtained. A single spot film was obtained of the resulting arthrogram.    One to 1.25 ml of the steroid/anesthetic solution was then injected into each of the facet joints noted above.   Additional Comments:  The patient tolerated the procedure well Dressing: Band-Aid    Post-procedure details: Patient was observed during the procedure. Post-procedure instructions were reviewed.  Patient left the clinic in stable condition.    Clinical History: No specialty comments available.   She reports that she quit smoking about 22 years ago. Her smoking use included cigarettes. She has a 5.00 pack-year smoking history. She has never used smokeless tobacco.  Recent Labs  06/27/17 0828  HGBA1C 6.0*    Objective:  VS:  HT:    WT:   BMI:     BP:137/86  HR:74bpm  TEMP:98.3 F (36.8 C)(Oral)  RESP:96 % Physical Exam  Constitutional: She is oriented to person, place, and time. She appears well-developed and well-nourished.  Eyes: Pupils are equal, round, and reactive to light. Conjunctivae and EOM are normal.  Cardiovascular: Normal rate and intact distal pulses.  Pulmonary/Chest: Effort normal.  Musculoskeletal:  Patient ambulates without aid.  She is somewhat slow to rise from a seated position to extension of the lumbar spine.  She does have pain with facet joint loading and extension of the lumbar spine.  She has some  tenderness across the lumbar spine PSIS.  She has exquisite tenderness over the right greater trochanter more than the left.  No pain with hip rotation internal or external.  She has good distal strength.  Neurological: She is alert and oriented to person, place, and time. She exhibits normal muscle tone.  Skin: Skin is warm and dry. No rash noted. No erythema.  Psychiatric: She has a normal mood and affect. Her behavior is normal.  Nursing note and vitals reviewed.   Ortho Exam Imaging: No results found.  Past Medical/Family/Surgical/Social History: Medications & Allergies reviewed per EMR, new medications updated. Patient Active Problem List   Diagnosis Date Noted  . Hyperglycemia 04/11/2017  . Dislocated patella 02/21/2017  . Trochanteric bursitis, right hip 09/24/2016  . Myofascitis 09/24/2016  . Post laminectomy syndrome 09/24/2016  . Chronic pain syndrome 09/24/2016  . Spondylosis without myelopathy or radiculopathy, lumbar region 07/30/2016  . Anxiety and depression 01/02/2016  . Hx of breast reconstruction 09/27/2015  . Breast cancer of upper-outer quadrant of left female breast ([place]) 09/09/2014  . HTN (hypertension) 12/24/2012  . GERD (gastroesophageal reflux disease) 12/24/2012  . Hearing loss in left ear 12/24/2012  . 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 Insomnia 12/24/2012  . Diverticulosis 12/24/2012  . Personal history of colonic polyps 12/24/2012  . Hyperlipidemia 12/24/2012  . BCC (basal cell carcinoma), arm 12/24/2012   Past Medical History:  Diagnosis Date  . Allergy   . 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 Anxiety    "sometimes" (09/23/2014)  . Cancer of left breast ([place]) 08/2014   left upper outer  . Cerebral meningioma ([place]) 09/2012   auditory; s/p excision  . Chronic lower back pain   . Chronic pain in left foot   . Deafness in left ear   . Facial droop    "left side; from brain tumor OR"  . GERD (gastroesophageal reflux disease)   .  History of chemotherapy    last treatment 03/02/2015  . History of hiatal hernia   . Hypertension   . Migraine    "before brain tumor was removed" (09/23/2014)  . Personal history of chemotherapy 09/12/2014  . PONV (postoperative nausea and vomiting)    "severe"  . Stress incontinence    Family History  Adopted: Yes  Problem Relation Age of Onset  . Hypertension Father    Past Surgical History:  Procedure Laterality Date  . AUGMENTATION MAMMAPLASTY Bilateral 11/11/2015  . BACK SURGERY    . BRAIN SURGERY  2014   cerebellopontine angle tumor with craniotomy   . BREAST BIOPSY Left 08/2014  . BREAST RECONSTRUCTION Left 05/18/2015  . BREAST RECONSTRUCTION WITH PLACEMENT OF TISSUE EXPANDER AND FLEX HD (ACELLULAR HYDRATED DERMIS) Left 05/18/2015   Procedure:  LEFT BREAST RECONSTRUCTION WITH PLACEMENT OF TISSUE EXPANDER AND ACELLULAR HYDRATED DERMIS MATRIX;  Surgeon: [name], MD;  Location: [place];  Service: Plastics;  Laterality: Left;  . COLONOSCOPY    . FOOT FRACTURE SURGERY Left 1990's  . FOOT SURGERY Left    "one the muscle; trying to roll foot inward after failed recovery S/P fracture"  . FRACTURE SURGERY    . JOINT REPLACEMENT    . KNEE ARTHROSCOPY Left   . MASTECTOMY COMPLETE / SIMPLE W/ SENTINEL NODE BIOPSY Left 09/23/2014  . MULTIPLE TOOTH EXTRACTIONS    . PARTIAL KNEE ARTHROPLASTY Left 2000's    Dr. Sharol Given  . PATELLAR TENDON REPAIR  05/27/2012   Procedure: PATELLA TENDON REPAIR;  Surgeon: Newt Minion, MD;  Location: [facility];  Service: Orthopedics;  Laterality: Right;  Right Patella Tendon  Reconstruction with Semi Tendonosum  . PORT-A-CATH REMOVAL Right 03/16/2015   Procedure: REMOVAL PORT-A-CATH;  Surgeon: Fanny Skates, MD;  Location: [place];  Service: General;  Laterality: Right;  . PORTACATH PLACEMENT Right 09/23/2014  . PORTACATH PLACEMENT Right 09/23/2014   Procedure: INSERTION PORT-A-CATH RIGHT SUBCLAVIAN;  Surgeon: Fanny Skates, MD;  Location: [place] [facility];  Service: General;   Laterality: Right;  . POSTERIOR LUMBAR FUSION  02/1988   L2 reconstruction; fusion  . SIMPLE MASTECTOMY WITH AXILLARY SENTINEL NODE BIOPSY Left 09/23/2014   Procedure: LEFT TOTAL MASTECTOMY WITH LEFT AXILLARY SENTINEL LYMPH NODE BIOPSY;  Surgeon: Fanny Skates, MD;  Location: [place];  Service: General;  Laterality: Left;  . TOTAL ABDOMINAL HYSTERECTOMY  12/1976  . TOTAL KNEE ARTHROPLASTY Right 05/24/2009    Dr. Sharol Given  . UPPER GASTROINTESTINAL ENDOSCOPY    . WISDOM TOOTH EXTRACTION     Social History   Occupational History  . Occupation: disability    Comment: HVAC  Tobacco Use  . Smoking status: Former Smoker    Packs/day: 0.25    Years: 20.00    Pack years: 5.00    Types: Cigarettes    Last attempt to quit: 05/22/1995    Years since quitting: 22.5  . Smokeless tobacco: Never Used  Substance and Sexual Activity  . Alcohol use: No  . Drug use: No  . Sexual activity: Yes    Partners: Male    Birth control/protection: None    Comment: menarche age 57, P 0 ,HRT x 42 yrs

## 2017-12-09 ENCOUNTER — Other Ambulatory Visit: Payer: Self-pay | Admitting: Physician Assistant

## 2017-12-09 DIAGNOSIS — F419 Anxiety disorder, unspecified: Secondary | ICD-10-CM

## 2017-12-09 DIAGNOSIS — F329 Major depressive disorder, single episode, unspecified: Secondary | ICD-10-CM

## 2017-12-09 DIAGNOSIS — G4700 Insomnia, unspecified: Secondary | ICD-10-CM

## 2017-12-09 MED ORDER — ZOLPIDEM TARTRATE 10 MG PO TABS: 10.0000 mg | ORAL_TABLET | Freq: Every day | ORAL | 0 refills | Status: DC

## 2017-12-09 MED ORDER — ALPRAZOLAM 0.25 MG PO TABS: 0.2500 mg | ORAL_TABLET | Freq: Two times a day (BID) | ORAL | 0 refills | Status: DC | PRN

## 2017-12-09 NOTE — Telephone Encounter (Signed)
Rx sent electronically.  Meds ordered this encounter  Medications  . ALPRAZolam (XANAX) 0.25 MG tablet    Sig: Take 1 tablet (0.25 mg total) by mouth 2 (two) times daily as needed.    Dispense:  60 tablet    Refill:  0    Not to exceed 5 additional fills before 06/04/2017  . zolpidem (AMBIEN) 10 MG tablet    Sig: Take 1 tablet (10 mg total) by mouth at bedtime.    Dispense:  30 tablet    Refill:  0    Not to exceed 5 additional fills before 05/29/2017

## 2017-12-09 NOTE — Telephone Encounter (Signed)
Patient is requesting a refill of the following medications: Requested Prescriptions   Pending Prescriptions Disp Refills  . ALPRAZolam (XANAX) 0.25 MG tablet 60 tablet 0    Sig: Take 1 tablet (0.25 mg total) by mouth 2 (two) times daily as needed.  . zolpidem (AMBIEN) 10 MG tablet 30 tablet 0    Sig: Take 1 tablet (10 mg total) by mouth at bedtime.    Date of patient request: 12/09/17 Last office visit: 06/27/17 Date of last refill: 11/13/2017 for both medications Last refill amount: Ambien #30 tablets no refills, Xanax #60 tablets no refills             Follow up time period per chart: Return in about 6 months (around 12/25/2017) for re-evalaution of blood pressure             and cholesterol, with FASTING labs

## 2017-12-12 MED ORDER — LIDOCAINE HCL 2 % IJ SOLN
4.0000 mL | INTRAMUSCULAR | Status: AC | PRN
  Administered 2017-12-02: 4 mL

## 2017-12-12 MED ORDER — TRIAMCINOLONE ACETONIDE 40 MG/ML IJ SUSP
40.0000 mg | INTRAMUSCULAR | Status: AC | PRN
  Administered 2017-12-02: 40 mg via INTRA_ARTICULAR

## 2017-12-12 MED ORDER — BUPIVACAINE HCL 0.25 % IJ SOLN
4.0000 mL | INTRAMUSCULAR | Status: AC | PRN
  Administered 2017-12-02: 4 mL via INTRA_ARTICULAR

## 2017-12-12 NOTE — Procedures (Signed)
Lumbar Facet Joint Intra-Articular Injection(s) with Fluoroscopic Guidance  Patient: Amy [name]      Date of Birth: 1951/03/12 MRN: [id number] PCP: [name], PA-C      Visit Date: 12/02/2017   Universal Protocol:    Date/Time: 12/02/2017  Consent Given By: the patient  Position: PRONE   Additional Comments: Vital signs were monitored before and after the procedure. Patient was prepped and draped in the usual sterile fashion. The correct patient, procedure, and site was verified.   Injection Procedure Details:  Procedure Site One Meds Administered:  Meds ordered this encounter  Medications  . methylPREDNISolone acetate (DEPO-MEDROL) injection 80 mg     Laterality: Bilateral  Location/Site:  L5-S1  Needle size: 22 guage  Needle type: Spinal  Needle Placement: Articular  Findings:  -Comments: Excellent flow of contrast producing a partial arthrogram.  Procedure Details: The fluoroscope beam is vertically oriented in AP, and the inferior recess is visualized beneath the lower pole of the inferior apophyseal process, which represents the target point for needle insertion. When direct visualization is difficult the target point is located at the medial projection of the vertebral pedicle. The region overlying each aforementioned target is locally anesthetized with a 1 to 2 ml. volume of 1% Lidocaine without Epinephrine.   The spinal needle was inserted into each of the above mentioned facet joints using biplanar fluoroscopic guidance. A 0.25 to 0.5 ml. volume of Isovue-250 was injected and a partial facet joint arthrogram was obtained. A single spot film was obtained of the resulting arthrogram.    One to 1.25 ml of the steroid/anesthetic solution was then injected into each of the facet joints noted above.   Additional Comments:  The patient tolerated the procedure well Dressing: Band-Aid    Post-procedure details: Patient was observed during the  procedure. Post-procedure instructions were reviewed.  Patient left the clinic in stable condition.

## 2017-12-21 ENCOUNTER — Other Ambulatory Visit: Payer: Self-pay | Admitting: Physician Assistant

## 2017-12-21 DIAGNOSIS — G4700 Insomnia, unspecified: Secondary | ICD-10-CM

## 2017-12-26 ENCOUNTER — Ambulatory Visit (INDEPENDENT_AMBULATORY_CARE_PROVIDER_SITE_OTHER): Payer: Managed Care, Other (non HMO) | Admitting: Physician Assistant

## 2017-12-26 ENCOUNTER — Encounter: Payer: Self-pay | Admitting: Physician Assistant

## 2017-12-26 ENCOUNTER — Other Ambulatory Visit: Payer: Self-pay

## 2017-12-26 VITALS — BP 132/80 | HR 74 | Temp 98.6°F | Resp 16 | Ht 65.0 in | Wt 147.2 lb

## 2017-12-26 DIAGNOSIS — J301 Allergic rhinitis due to pollen: Secondary | ICD-10-CM | POA: Insufficient documentation

## 2017-12-26 DIAGNOSIS — G4700 Insomnia, unspecified: Secondary | ICD-10-CM

## 2017-12-26 DIAGNOSIS — F419 Anxiety disorder, unspecified: Secondary | ICD-10-CM

## 2017-12-26 DIAGNOSIS — I10 Essential (primary) hypertension: Secondary | ICD-10-CM

## 2017-12-26 DIAGNOSIS — R739 Hyperglycemia, unspecified: Secondary | ICD-10-CM | POA: Diagnosis not present

## 2017-12-26 DIAGNOSIS — E785 Hyperlipidemia, unspecified: Secondary | ICD-10-CM | POA: Diagnosis not present

## 2017-12-26 DIAGNOSIS — F329 Major depressive disorder, single episode, unspecified: Secondary | ICD-10-CM

## 2017-12-26 MED ORDER — ALPRAZOLAM 0.25 MG PO TABS: 0.2500 mg | ORAL_TABLET | Freq: Two times a day (BID) | ORAL | 0 refills | Status: DC | PRN

## 2017-12-26 MED ORDER — ZOLPIDEM TARTRATE 10 MG PO TABS: 10.0000 mg | ORAL_TABLET | Freq: Every day | ORAL | 0 refills | Status: AC

## 2017-12-26 MED ORDER — ZOLPIDEM TARTRATE 10 MG PO TABS: 10.0000 mg | ORAL_TABLET | Freq: Every evening | ORAL | 0 refills | Status: DC | PRN

## 2017-12-26 MED ORDER — ALPRAZOLAM 0.25 MG PO TABS: 0.2500 mg | ORAL_TABLET | Freq: Two times a day (BID) | ORAL | 0 refills | Status: AC | PRN

## 2017-12-26 MED ORDER — IPRATROPIUM BROMIDE 0.03 % NA SOLN: 2.0000 | Freq: Two times a day (BID) | NASAL | 3 refills | Status: AC

## 2017-12-26 MED ORDER — CITALOPRAM HYDROBROMIDE 40 MG PO TABS: 40.0000 mg | ORAL_TABLET | Freq: Every day | ORAL | 3 refills | Status: AC

## 2017-12-26 MED ORDER — LISINOPRIL 20 MG PO TABS: 20.0000 mg | ORAL_TABLET | Freq: Every day | ORAL | 3 refills | Status: AC

## 2017-12-26 NOTE — Assessment & Plan Note (Signed)
Update labs, interpret results with understanding of non-fasting status.

## 2017-12-26 NOTE — Assessment & Plan Note (Signed)
Currently not well controlled. Resume treatment.

## 2017-12-26 NOTE — Assessment & Plan Note (Signed)
Controlled. Stable. Continue current treatment.

## 2017-12-26 NOTE — Patient Instructions (Addendum)
Go ahead and call [facility] to schedule your next visit with me there. [phone].   IF you received an x-ray today, you will receive an invoice from [facility]. Please contact [facility] at [phone] with questions or concerns regarding your invoice.   IF you received labwork today, you will receive an invoice from [facility]. Please contact [facility] at [phone] with questions or concerns regarding your invoice.   Our billing staff will not be able to assist you with questions regarding bills from these companies.  You will be contacted with the lab results as soon as they are available. The fastest way to get your results is to activate your My Chart account. Instructions are located on the last page of this paperwork. If you have not heard from us regarding the results in 2 weeks, please contact this office.     

## 2017-12-26 NOTE — Assessment & Plan Note (Signed)
WEll controlled.

## 2017-12-26 NOTE — Progress Notes (Signed)
Patient ID: Amy [name], female    DOB: 05/27/1951, 67 y.o.   MRN: [id number]  PCP: [name], PA-C  Chief Complaint  Patient presents with  . Hyperlipidemia    follow up  . Hypertension    follow up    Subjective:   Presents for evaluation of HTN, hyperlipidemia, and prescription refills.  She is doing well. No problems, concerns. Tolerates her medications well. Coffee with sugar, toast with jelly this AM Received a steroid injection in the RIGHT foot for plantar fasciitis yesterday.  Home BP checks 120-130's/70's, as low as 110/60's   Review of Systems  Constitutional: Negative.   HENT: Negative for sore throat.   Eyes: Negative for visual disturbance.  Respiratory: Negative for cough, chest tightness, shortness of breath and wheezing.   Cardiovascular: Negative for chest pain and palpitations.  Gastrointestinal: Negative for abdominal pain, diarrhea, nausea and vomiting.  Endocrine: Negative.   Genitourinary: Negative for dysuria, frequency, hematuria and urgency.  Musculoskeletal: Negative for arthralgias and myalgias.  Skin: Negative for rash.  Allergic/Immunologic: Positive for environmental allergies.  Neurological: Negative for dizziness, weakness and headaches.  Hematological: Negative.   Psychiatric/Behavioral: Negative for decreased concentration. The patient is not nervous/anxious.        Patient Active Problem List   Diagnosis Date Noted  . Hyperglycemia 04/11/2017  . Dislocated patella 02/21/2017  . Trochanteric bursitis, right hip 09/24/2016  . Myofascitis 09/24/2016  . Post laminectomy syndrome 09/24/2016  . Chronic pain syndrome 09/24/2016  . Spondylosis without myelopathy or radiculopathy, lumbar region 07/30/2016  . Anxiety and depression 01/02/2016  . Hx of breast reconstruction 09/27/2015  . Breast cancer of upper-outer quadrant of left female breast ([place]) 09/09/2014  . HTN (hypertension) 12/24/2012  . GERD  (gastroesophageal reflux disease) 12/24/2012  . Hearing loss in left ear 12/24/2012  . 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 Insomnia 12/24/2012  . Diverticulosis 12/24/2012  . Personal history of colonic polyps 12/24/2012  . Hyperlipidemia 12/24/2012  . BCC (basal cell carcinoma), arm 12/24/2012     Prior to Admission medications   Medication Sig Start Date End Date Taking? Authorizing Provider  ALPRAZolam (XANAX) 0.25 MG tablet Take 1 tablet (0.25 mg total) by mouth 2 (two) times daily as needed. 12/09/17  Yes Imani Sherrin, PA-C  citalopram (CELEXA) 40 MG tablet TAKE 1 TABLET BY MOUTH EVERY DAY 12/22/17  Yes Adis Sturgill, PA-C  HYDROcodone-acetaminophen (NORCO/VICODIN) 5-325 MG tablet  09/06/15  Yes [provider]  lansoprazole (PREVACID) 15 MG capsule Take 30 mg by mouth daily at 12 noon. Reported on 01/02/2016   Yes [provider]  lisinopril (PRINIVIL,ZESTRIL) 20 MG tablet Take 1 tablet (20 mg total) by mouth daily. 04/08/17  Yes Abbi Mancini, PA-C  metoprolol succinate (TOPROL-XL) 100 MG 24 hr tablet TAKE 1 TABLET (100 MG TOTAL) BY MOUTH DAILY. TAKE WITH OR IMMEDIATELY FOLLOWING A MEAL. 06/27/17  Yes Kessler Kopinski, PA-C  zolpidem (AMBIEN) 10 MG tablet Take 1 tablet (10 mg total) by mouth at bedtime. 12/09/17  Yes Josemaria Brining, PA-C  ipratropium (ATROVENT) 0.06 % nasal spray Place 2 sprays 4 (four) times daily into both nostrils. 06/13/17 07/11/17  [provider]  methocarbamol (ROBAXIN) 500 MG tablet TAKE 1 TABLET BY MOUTH THREE TIMES A DAY Patient not taking: Reported on 12/26/2017 10/31/17   Newt Minion, MD     Allergies  Allergen Reactions  . Codeine Nausea And Vomiting  . Darvocet [Propoxyphene N-Acetaminophen] Other (See Comments)  Pruritis, rapid heart rate  . Tramadol Other (See Comments)    Chest tightness  . Gold-Containing Drug Products Swelling  . Meperidine Nausea And Vomiting  . Morphine Nausea And Vomiting  . Verapamil     fatigue    . Sulfa Antibiotics Rash       Objective:  Physical Exam  Constitutional: She is oriented to person, place, and time. She appears well-developed and well-nourished. She is active and cooperative. No distress.  BP 132/80   Pulse 74   Temp 98.6 F (37 C)   Resp 16   Ht 5\' 5"  (1.651 m)   Wt 147 lb 3.2 oz (66.8 kg)   SpO2 96%   BMI 24.50 kg/m   HENT:  Head: Normocephalic and atraumatic.  Right Ear: Hearing normal.  Left Ear: Hearing normal.  Eyes: Conjunctivae are normal. No scleral icterus.  Neck: Normal range of motion. Neck supple. No thyromegaly present.  Cardiovascular: Normal rate, regular rhythm and normal heart sounds.  Pulses:      Radial pulses are 2+ on the right side, and 2+ on the left side.  Pulmonary/Chest: Effort normal and breath sounds normal.  Lymphadenopathy:       Head (right side): No tonsillar, no preauricular, no posterior auricular and no occipital adenopathy present.       Head (left side): No tonsillar, no preauricular, no posterior auricular and no occipital adenopathy present.    She has no cervical adenopathy.       Right: No supraclavicular adenopathy present.       Left: No supraclavicular adenopathy present.  Neurological: She is alert and oriented to person, place, and time. No sensory deficit.  Skin: Skin is warm, dry and intact. No rash noted. No cyanosis or erythema. Nails show no clubbing.  Psychiatric: She has a normal mood and affect. Her speech is normal and behavior is normal.   Wt Readings from Last 3 Encounters:  12/26/17 147 lb 3.2 oz (66.8 kg)  09/08/17 152 lb (68.9 kg)  06/27/17 152 lb 12 oz (69.3 kg)       Assessment & Plan:   Problem List Items Addressed This Visit    HTN (hypertension)    Controlled. Stable. Continue current treatment.      Relevant Medications   lisinopril (PRINIVIL,ZESTRIL) 20 MG tablet   Other Relevant Orders   CBC with Differential/Platelet   Comprehensive metabolic panel   TSH   Insomnia     Well controlled.      Relevant Medications   zolpidem (AMBIEN) 10 MG tablet   zolpidem (AMBIEN) 10 MG tablet   zolpidem (AMBIEN) 10 MG tablet   zolpidem (AMBIEN) 10 MG tablet   Hyperlipidemia - Primary    Update labs, interpret results with understanding of non-fasting status.      Relevant Medications   lisinopril (PRINIVIL,ZESTRIL) 20 MG tablet   Other Relevant Orders   Comprehensive metabolic panel   Lipid panel   Anxiety and depression    WEll controlled.      Relevant Medications   ALPRAZolam (XANAX) 0.25 MG tablet   citalopram (CELEXA) 40 MG tablet   ALPRAZolam (XANAX) 0.25 MG tablet   ALPRAZolam (XANAX) 0.25 MG tablet   ALPRAZolam (XANAX) 0.25 MG tablet   Hyperglycemia    Update labs. Not fasting. Steroid injection yesterday.      Relevant Orders   Comprehensive metabolic panel   Hemoglobin A1c   Seasonal allergic rhinitis due to pollen    Currently not well  controlled. Resume treatment.      Relevant Medications   ipratropium (ATROVENT) 0.03 % nasal spray       Return in about 4 months (around 04/28/2018) for re-evalaution of blood pressure, cholesterol, mood.   Fara Chute, PA-C [facility]

## 2017-12-26 NOTE — Assessment & Plan Note (Signed)
Update labs. Not fasting. Steroid injection yesterday.

## 2017-12-26 NOTE — Assessment & Plan Note (Signed)
Well controlled 

## 2017-12-27 LAB — COMPREHENSIVE METABOLIC PANEL
ALT: 14 IU/L (ref 0–32)
AST: 17 IU/L (ref 0–40)
Albumin/Globulin Ratio: 2.1 (ref 1.2–2.2)
Albumin: 4.2 g/dL (ref 3.6–4.8)
Alkaline Phosphatase: 60 IU/L (ref 39–117)
BUN/Creatinine Ratio: 14 (ref 12–28)
BUN: 10 mg/dL (ref 8–27)
Bilirubin Total: 0.3 mg/dL (ref 0.0–1.2)
CALCIUM: 9.3 mg/dL (ref 8.7–10.3)
CO2: 20 mmol/L (ref 20–29)
CREATININE: 0.7 mg/dL (ref 0.57–1.00)
Chloride: 102 mmol/L (ref 96–106)
GFR calc Af Amer: 106 mL/min/{1.73_m2} (ref >59)
GFR, EST NON AFRICAN AMERICAN: 92 mL/min/{1.73_m2} (ref >59)
GLOBULIN, TOTAL: 2 g/dL (ref 1.5–4.5)
Glucose: 88 mg/dL (ref 65–99)
Potassium: 4.3 mmol/L (ref 3.5–5.2)
Sodium: 138 mmol/L (ref 134–144)
Total Protein: 6.2 g/dL (ref 6.0–8.5)

## 2017-12-27 LAB — CBC WITH DIFFERENTIAL/PLATELET
BASOS ABS: 0 10*3/uL (ref 0.0–0.2)
Basos: 0 %
EOS (ABSOLUTE): 0.1 10*3/uL (ref 0.0–0.4)
Eos: 1 %
HEMOGLOBIN: 13 g/dL (ref 11.1–15.9)
Hematocrit: 39.1 % (ref 34.0–46.6)
Immature Grans (Abs): 0 10*3/uL (ref 0.0–0.1)
Immature Granulocytes: 0 %
LYMPHS ABS: 2.7 10*3/uL (ref 0.7–3.1)
Lymphs: 41 %
MCH: 28.3 pg (ref 26.6–33.0)
MCHC: 33.2 g/dL (ref 31.5–35.7)
MCV: 85 fL (ref 79–97)
MONOCYTES: 11 %
MONOS ABS: 0.7 10*3/uL (ref 0.1–0.9)
NEUTROS ABS: 3.1 10*3/uL (ref 1.4–7.0)
Neutrophils: 47 %
PLATELETS: 445 10*3/uL — AB (ref 150–379)
RBC: 4.59 x10E6/uL (ref 3.77–5.28)
RDW: 14.8 % (ref 12.3–15.4)
WBC: 6.5 10*3/uL (ref 3.4–10.8)

## 2017-12-27 LAB — LIPID PANEL
Chol/HDL Ratio: 4.1 ratio (ref 0.0–4.4)
Cholesterol, Total: 213 mg/dL — ABNORMAL HIGH (ref 100–199)
HDL: 52 mg/dL
LDL Calculated: 137 mg/dL — ABNORMAL HIGH (ref 0–99)
Triglycerides: 119 mg/dL (ref 0–149)
VLDL Cholesterol Cal: 24 mg/dL (ref 5–40)

## 2017-12-27 LAB — HEMOGLOBIN A1C
ESTIMATED AVERAGE GLUCOSE: 123 mg/dL
HEMOGLOBIN A1C: 5.9 % — AB (ref 4.8–5.6)

## 2017-12-27 LAB — TSH: TSH: 1.22 u[IU]/mL (ref 0.450–4.500)

## 2018-01-01 ENCOUNTER — Other Ambulatory Visit (INDEPENDENT_AMBULATORY_CARE_PROVIDER_SITE_OTHER): Payer: Self-pay | Admitting: Orthopedic Surgery

## 2018-01-18 ENCOUNTER — Encounter: Payer: Self-pay | Admitting: Physician Assistant

## 2018-01-29 ENCOUNTER — Other Ambulatory Visit: Payer: Self-pay | Admitting: Physician Assistant

## 2018-01-29 DIAGNOSIS — I10 Essential (primary) hypertension: Secondary | ICD-10-CM

## 2018-01-29 MED ORDER — METOPROLOL SUCCINATE ER 100 MG PO TB24: ORAL_TABLET | ORAL | 0 refills | Status: AC

## 2018-01-29 NOTE — Addendum Note (Signed)
Addended by: Matilde Sprang on: 01/29/2018 04:43 PM   Modules accepted: Orders

## 2018-04-08 ENCOUNTER — Telehealth (INDEPENDENT_AMBULATORY_CARE_PROVIDER_SITE_OTHER): Payer: Self-pay | Admitting: Physical Medicine and Rehabilitation

## 2018-04-08 NOTE — Telephone Encounter (Signed)
ok 

## 2018-04-08 NOTE — Telephone Encounter (Signed)
Need auth for bilateral [id number]. Needs to be scheduled for both bilat L5-S1 facets and bursa injection- same day/ time ok.

## 2018-04-09 NOTE — Telephone Encounter (Signed)
Submitted Clinical Notes to [facility] website Initiated Date:04/09/2018 Case Activity:Submission in ProgressCase Status:Pending

## 2018-04-10 ENCOUNTER — Telehealth (INDEPENDENT_AMBULATORY_CARE_PROVIDER_SITE_OTHER): Payer: Self-pay

## 2018-04-10 NOTE — Telephone Encounter (Signed)
Talked with patient's husband Herbie [place] and advised him that authorization is currently pending.

## 2018-04-14 NOTE — Telephone Encounter (Signed)
Pt is scheduled for OV due to denial with insurance.

## 2018-04-23 ENCOUNTER — Ambulatory Visit (INDEPENDENT_AMBULATORY_CARE_PROVIDER_SITE_OTHER): Payer: Self-pay

## 2018-04-23 ENCOUNTER — Encounter (INDEPENDENT_AMBULATORY_CARE_PROVIDER_SITE_OTHER): Payer: Self-pay | Admitting: Physical Medicine and Rehabilitation

## 2018-04-23 ENCOUNTER — Ambulatory Visit (INDEPENDENT_AMBULATORY_CARE_PROVIDER_SITE_OTHER): Payer: Managed Care, Other (non HMO) | Admitting: Physical Medicine and Rehabilitation

## 2018-04-23 VITALS — BP 146/94 | HR 86 | Ht 65.0 in | Wt 146.0 lb

## 2018-04-23 DIAGNOSIS — M47816 Spondylosis without myelopathy or radiculopathy, lumbar region: Secondary | ICD-10-CM

## 2018-04-23 DIAGNOSIS — M5441 Lumbago with sciatica, right side: Secondary | ICD-10-CM

## 2018-04-23 DIAGNOSIS — M7061 Trochanteric bursitis, right hip: Secondary | ICD-10-CM

## 2018-04-23 DIAGNOSIS — G8929 Other chronic pain: Secondary | ICD-10-CM

## 2018-04-23 DIAGNOSIS — M961 Postlaminectomy syndrome, not elsewhere classified: Secondary | ICD-10-CM

## 2018-04-23 NOTE — Progress Notes (Signed)
Amy [name] - 67 y.o. female MRN [id number]  Date of birth: January 27, 1951  Office Visit Note: Visit Date: 04/23/2018 PCP: System, Pcp Not In Referred by: No ref. provider found  Subjective: Chief Complaint  Patient presents with  . Lower Back - Pain  . Right Hip - Pain   HPI: Amy [name] is a 67 year old female whose history is well-documented in our notes.  We see her once every 4 to 6 months for chronic low back pain and hip pain which is mainly been facet mediated pain below her longstem fusion for prior burst fracture as well as greater trochanteric pain syndrome or bursitis.  She has had a long history of problems with her spine as well as history of resected brain tumor and subsequent stroke.  She had a long history of medication type management with narcotics which were never really beneficial.  She had a history of pain over the pelvic harvesting site for her fusion procedure in the spine.  Last time we saw her we completed facet joint block as well as greater trochanteric injection using fluoroscopic guidance to the body habitus.  These diagnostic facet joint blocks gave her 70% relief of her back pain.  She comes in today with similar complaints with no new trauma.  She has had no new health concerns.  No radicular complaints down the legs.  She denies any focal weakness.  She reports worsening pain with sitting for a long time and going from sit to stand and with movement.  She does use ibuprofen and a pain cream and this seems to help to a degree.  She does use Lidoderm patches which have been approved.  We have had trouble in the past getting those approved but her insurance is approving those now.  She rates her pain is a 6 out of 10 but does not seem to let this bother her from a functional standpoint.  Historically diagnostic facet joint injections have helped upwards of 6 months of pain relief and we did not pursue ablation.  Unfortunately our request for repeat injection had  been denied because of failure to plan for radiofrequency ablation.  She also has this pain over the greater trochanter that is been problematic for her.  She has had physical therapy in the past and she does use massage and foam roller.  Greater trochanteric injection has been very beneficial in this regard as well and we done this very infrequently.   Review of Systems  Constitutional: Negative for chills, fever, malaise/fatigue and weight loss.  HENT: Negative for hearing loss and sinus pain.   Eyes: Negative for blurred vision, double vision and photophobia.  Respiratory: Negative for cough and shortness of breath.   Cardiovascular: Negative for chest pain, palpitations and leg swelling.  Gastrointestinal: Negative for abdominal pain, nausea and vomiting.  Genitourinary: Negative for flank pain.  Musculoskeletal: Positive for back pain and joint pain. Negative for myalgias.  Skin: Negative for itching and rash.  Neurological: Negative for tingling, tremors, focal weakness and weakness.  Endo/Heme/Allergies: Negative.   Psychiatric/Behavioral: Negative for depression.  All other systems reviewed and are negative.  Otherwise per HPI.  Assessment & Plan: Visit Diagnoses:  1. Greater trochanteric bursitis, right   2. Post laminectomy syndrome   3. Spondylosis without myelopathy or radiculopathy, lumbar region   4. Chronic bilateral low back pain with right-sided sciatica     Plan: Findings:  1.  Chronic axial low back pain status post long stem fusion  for burst fracture remotely.  Chronic pain over harvesting site which is relieved with Lidoderm patches.  Imaging is shown adjacent level disease at L5-S1 with facet arthropathy.  Prior diagnostic injections of the sacroiliac joints were not effective.  Patient is done extremely well with very infrequent facet joint blocks.  Cortisone utilized gave her upwards of 6 months of relief.  Last injection was in April.  Her pain is worse going from  sit to stand and prolonged standing.  She gets some pain with twisting.  No radicular complaints no paresthesias.  Exam is consistent with facet mediated pain with facet joint loading.  She has had multiple bouts of physical therapy in the past.  This is been a long-term condition that we have treated in our notes reflect that.  History of significant opioid management which was not beneficial and she is not taking any opioids at this point.  Respectfully would like to ask for diagnostic facet block.  If she does get good relief once again we would ask for permission and preauthorization for radiofrequency ablation of the L5-S1 facet joints.  This would hopefully be more definitive treatment.  2.  In terms of her greater trochanteric bursitis we will complete diagnostic and therapeutic greater trochanteric injection with fluoroscopic guidance.  Fluoroscopic guidance used for body habitus location.  She is done well with this in the past.  She continues to do well with conservative care it is seems to flareup from time to time.  She is doing manual treatment and ice.  Have talked about foam rolling.    Meds & Orders: No orders of the defined types were placed in this encounter.   Orders Placed This Encounter  Procedures  . Large Joint Inj: R greater trochanter  . XR C-ARM NO REPORT    Follow-up: Return for Diagnostic bilateral L5-S1 facet joint/medial branch blocks..   Procedures: Large Joint Inj: R greater trochanter on 04/23/2018 9:33 AM Indications: pain and diagnostic evaluation Details: 22 G 3.5 in needle, fluoroscopy-guided lateral approach  Arthrogram: No  Medications: 4 mL lidocaine 2 %; 80 mg triamcinolone acetonide 40 MG/ML; 4 mL bupivacaine 0.25 % Outcome: tolerated well, no immediate complications  There was excellent flow of contrast outlined the greater trochanteric bursa without vascular uptake. Procedure, treatment alternatives, risks and benefits explained, specific risks  discussed. Consent was given by the patient. Immediately prior to procedure a time out was called to verify the correct patient, procedure, equipment, support staff and site/side marked as required. Patient was prepped and draped in the usual sterile fashion.      No notes on file   Clinical History: CT Myelogram 2007L3-L4: Posterior fusion hardware intact. No  spinal or foraminal stenosis. Interspace unremarkable.   L4-L5: Bilateral laminar hooks at L4 intact. These result in some encroachment on the posterior aspect of thecal sac and contribute to mild   narrowing in the anteroposterior dimension. Lateral   and subarticular recesses remain patent. No significant disc bulge or protrusion. Foramina remain widely patent. Mild degenerative changes in the facet joints bilaterally without significant hypertrophy.   L5-S1: Moderately advanced bilateral facet degenerative hypertrophy. Interspace is unremarkable. Neural foramina widely patent.   Impression: 1. Posterior spinal fusion T11-L4 without apparent complication. 2. Bilateral facet degenerative hypertrophy, mild L4-L5, more advanced L5-S1.   She reports that she quit smoking about 22 years ago. Her smoking use included cigarettes. She has a 5.00 pack-year smoking history. She has never used smokeless tobacco.  Recent Labs  06/27/17 0828 12/26/17 0911  HGBA1C 6.0* 5.9*    Objective:  VS:  HT:5\' 5"  (165.1 cm)   WT:146 lb (66.2 kg)  BMI:24.3    BP:(!) 146/94  HR:86bpm  TEMP: ( )  RESP:  Physical Exam  Constitutional: She is oriented to person, place, and time. She appears well-developed and well-nourished. No distress.  HENT:  Head: Normocephalic.  Nose: Nose normal.  Mouth/Throat: Oropharynx is clear and moist.  Some left-sided facial droop chronic  Eyes: Pupils are equal, round, and reactive to light. Conjunctivae are normal.  Neck: Normal range of motion. Neck supple. No JVD present. No tracheal deviation present.    Cardiovascular: Regular rhythm and intact distal pulses.  Pulmonary/Chest: Effort normal. No respiratory distress.  Abdominal: She exhibits no distension. There is no guarding.  Musculoskeletal:  Patient ambulates without aid.  She is slow to rise from a seated position to full extension.  She has pain with facet joint loading and extension and rotation bilaterally.  She has pain over the right greater trochanter.  She has a negative slump test bilaterally and good distal strength.  Lymphadenopathy:    She has no cervical adenopathy.  Neurological: She is alert and oriented to person, place, and time. She exhibits normal muscle tone. Coordination normal.  Skin: Skin is warm. No rash noted. No erythema.  Psychiatric: She has a normal mood and affect. Her behavior is normal.  Nursing note and vitals reviewed.   Ortho Exam Imaging: No results found.  Past Medical/Family/Surgical/Social History: Medications & Allergies reviewed per EMR, new medications updated. Patient Active Problem List   Diagnosis Date Noted  . Seasonal allergic rhinitis due to pollen 12/26/2017  . Prediabetes 04/11/2017  . Dislocated patella 02/21/2017  . Trochanteric bursitis, right hip 09/24/2016  . Myofascitis 09/24/2016  . Post laminectomy syndrome 09/24/2016  . Chronic pain syndrome 09/24/2016  . Spondylosis without myelopathy or radiculopathy, lumbar region 07/30/2016  . Anxiety and depression 01/02/2016  . Hx of breast reconstruction 09/27/2015  . Breast cancer of upper-outer quadrant of left female breast ([place]) 09/09/2014  . HTN (hypertension) 12/24/2012  . GERD (gastroesophageal reflux disease) 12/24/2012  . Hearing loss in left ear 12/24/2012  . 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 Insomnia 12/24/2012  . Diverticulosis 12/24/2012  . Personal history of colonic polyps 12/24/2012  . Hyperlipidemia 12/24/2012  . BCC (basal cell carcinoma), arm 12/24/2012   Past Medical History:  Diagnosis Date  . Allergy    . 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 Anxiety    "sometimes" (09/23/2014)  . Cancer of left breast ([place]) 08/2014   left upper outer  . Cerebral meningioma ([place]) 09/2012   auditory; s/p excision  . Chronic lower back pain   . Chronic pain in left foot   . Deafness in left ear   . Facial droop    "left side; from brain tumor OR"  . GERD (gastroesophageal reflux disease)   . History of chemotherapy    last treatment 03/02/2015  . History of hiatal hernia   . Hypertension   . Migraine    "before brain tumor was removed" (09/23/2014)  . Personal history of chemotherapy 09/12/2014  . PONV (postoperative nausea and vomiting)    "severe"  . Stress incontinence    Family History  Adopted: Yes  Problem Relation Age of Onset  . Hypertension Father    Past Surgical History:  Procedure Laterality Date  . AUGMENTATION MAMMAPLASTY Bilateral 11/11/2015  .  BACK SURGERY    . BRAIN SURGERY  2014   cerebellopontine angle tumor with craniotomy   . BREAST BIOPSY Left 08/2014  . BREAST RECONSTRUCTION Left 05/18/2015  . BREAST RECONSTRUCTION WITH PLACEMENT OF TISSUE EXPANDER AND FLEX HD (ACELLULAR HYDRATED DERMIS) Left 05/18/2015   Procedure: LEFT BREAST RECONSTRUCTION WITH PLACEMENT OF TISSUE EXPANDER AND ACELLULAR HYDRATED DERMIS MATRIX;  Surgeon: [name], MD;  Location: [place];  Service: Plastics;  Laterality: Left;  . COLONOSCOPY    . FOOT FRACTURE SURGERY Left 1990's  . FOOT SURGERY Left    "one the muscle; trying to roll foot inward after failed recovery S/P fracture"  . FRACTURE SURGERY    . JOINT REPLACEMENT    . KNEE ARTHROSCOPY Left   . MASTECTOMY COMPLETE / SIMPLE W/ SENTINEL NODE BIOPSY Left 09/23/2014  . MULTIPLE TOOTH EXTRACTIONS    . PARTIAL KNEE ARTHROPLASTY Left 2000's    Dr. Sharol Given  . PATELLAR TENDON REPAIR  05/27/2012   Procedure: PATELLA TENDON REPAIR;  Surgeon: Newt Minion, MD;  Location: [place];  Service: Orthopedics;  Laterality: Right;  Right  Patella Tendon  Reconstruction with Semi Tendonosum  . PORT-A-CATH REMOVAL Right 03/16/2015   Procedure: REMOVAL PORT-A-CATH;  Surgeon: Fanny Skates, MD;  Location: [place];  Service: General;  Laterality: Right;  . PORTACATH PLACEMENT Right 09/23/2014  . PORTACATH PLACEMENT Right 09/23/2014   Procedure: INSERTION PORT-A-CATH RIGHT SUBCLAVIAN;  Surgeon: Fanny Skates, MD;  Location: [place];  Service: General;  Laterality: Right;  . POSTERIOR LUMBAR FUSION  02/1988   L2 reconstruction; fusion  . SIMPLE MASTECTOMY WITH AXILLARY SENTINEL NODE BIOPSY Left 09/23/2014   Procedure: LEFT TOTAL MASTECTOMY WITH LEFT AXILLARY SENTINEL LYMPH NODE BIOPSY;  Surgeon: Fanny Skates, MD;  Location: [place];  Service: General;  Laterality: Left;  . TOTAL ABDOMINAL HYSTERECTOMY  12/1976  . TOTAL KNEE ARTHROPLASTY Right 05/24/2009    Dr. Sharol Given  . UPPER GASTROINTESTINAL ENDOSCOPY    . WISDOM TOOTH EXTRACTION     Social History   Occupational History  . Occupation: disability    Comment: HVAC  Tobacco Use  . Smoking status: Former Smoker    Packs/day: 0.25    Years: 20.00    Pack years: 5.00    Types: Cigarettes    Last attempt to quit: 05/22/1995    Years since quitting: 22.9  . Smokeless tobacco: Never Used  Substance and Sexual Activity  . Alcohol use: No  . Drug use: No  . Sexual activity: Yes    Partners: Male    Birth control/protection: None    Comment: menarche age 71, P 0 ,HRT x 42 yrs

## 2018-04-23 NOTE — Progress Notes (Signed)
 .  Numeric Pain Rating Scale and Functional Assessment Average Pain 5 Pain Right Now 6 My pain is constant, stabbing and aching Pain is worse with: sitting Pain improves with: rest   In the last MONTH (on 0-10 scale) has pain interfered with the following?  1. General activity like being  able to carry out your everyday physical activities such as walking, climbing stairs, carrying groceries, or moving a chair?  Rating(5)  2. Relation with others like being able to carry out your usual social activities and roles such as  activities at home, at work and in your community. Rating(4)  3. Enjoyment of life such that you have  been bothered by emotional problems such as feeling anxious, depressed or irritable?  Rating(0)

## 2018-04-23 NOTE — Patient Instructions (Signed)

## 2018-04-30 ENCOUNTER — Telehealth (INDEPENDENT_AMBULATORY_CARE_PROVIDER_SITE_OTHER): Payer: Self-pay | Admitting: Radiology

## 2018-04-30 ENCOUNTER — Ambulatory Visit (INDEPENDENT_AMBULATORY_CARE_PROVIDER_SITE_OTHER): Payer: Self-pay | Admitting: Physical Medicine and Rehabilitation

## 2018-04-30 NOTE — Telephone Encounter (Signed)
Patient would like a call back with status of insurance approval for back injection.  Call back # [phone]

## 2018-05-04 ENCOUNTER — Telehealth (INDEPENDENT_AMBULATORY_CARE_PROVIDER_SITE_OTHER): Payer: Self-pay | Admitting: Physical Medicine and Rehabilitation

## 2018-05-04 ENCOUNTER — Telehealth (INDEPENDENT_AMBULATORY_CARE_PROVIDER_SITE_OTHER): Payer: Self-pay | Admitting: *Deleted

## 2018-05-04 NOTE — Telephone Encounter (Signed)
Called pt and advised on denial of insurance, sent message to Dr. Ernestina Patches to advise.

## 2018-05-04 NOTE — Telephone Encounter (Signed)
Patient called wanting to get copy of records. Wants to pickup when she comes to sign to release form. Records from 09-24-2006- present are ready at front desk

## 2018-05-05 ENCOUNTER — Encounter (INDEPENDENT_AMBULATORY_CARE_PROVIDER_SITE_OTHER): Payer: Self-pay | Admitting: Physical Medicine and Rehabilitation

## 2018-05-05 MED ORDER — LIDOCAINE HCL 2 % IJ SOLN
4.0000 mL | INTRAMUSCULAR | Status: AC | PRN
  Administered 2018-04-23: 4 mL

## 2018-05-05 MED ORDER — TRIAMCINOLONE ACETONIDE 40 MG/ML IJ SUSP
80.0000 mg | INTRAMUSCULAR | Status: AC | PRN
  Administered 2018-04-23: 80 mg via INTRA_ARTICULAR

## 2018-05-05 MED ORDER — BUPIVACAINE HCL 0.25 % IJ SOLN
4.0000 mL | INTRAMUSCULAR | Status: AC | PRN
  Administered 2018-04-23: 4 mL via INTRA_ARTICULAR

## 2018-05-05 NOTE — Telephone Encounter (Signed)
done

## 2018-05-07 ENCOUNTER — Telehealth (INDEPENDENT_AMBULATORY_CARE_PROVIDER_SITE_OTHER): Payer: Self-pay | Admitting: *Deleted

## 2018-05-07 NOTE — Telephone Encounter (Signed)
Submitted prior auth to [place] case is pending.

## 2018-05-07 NOTE — Telephone Encounter (Signed)
Faxed more clinical notes to [facility], case is still pending.

## 2018-05-08 ENCOUNTER — Telehealth (INDEPENDENT_AMBULATORY_CARE_PROVIDER_SITE_OTHER): Payer: Self-pay | Admitting: Radiology

## 2018-05-08 NOTE — Telephone Encounter (Signed)
Left message returning phone call to schedule injection appointment  Cb# [phone]

## 2018-05-08 NOTE — Telephone Encounter (Signed)
Pt is scheduled with driver 64/38/38.

## 2018-05-08 NOTE — Telephone Encounter (Signed)
Called pt and left vm#1.

## 2018-05-14 NOTE — Telephone Encounter (Signed)
Pt has an appt 05/21/18 with driver, no BT.

## 2018-05-21 ENCOUNTER — Ambulatory Visit (INDEPENDENT_AMBULATORY_CARE_PROVIDER_SITE_OTHER): Payer: Self-pay

## 2018-05-21 ENCOUNTER — Ambulatory Visit (INDEPENDENT_AMBULATORY_CARE_PROVIDER_SITE_OTHER): Payer: Managed Care, Other (non HMO) | Admitting: Physical Medicine and Rehabilitation

## 2018-05-21 ENCOUNTER — Encounter (INDEPENDENT_AMBULATORY_CARE_PROVIDER_SITE_OTHER): Payer: Self-pay | Admitting: Physical Medicine and Rehabilitation

## 2018-05-21 VITALS — BP 124/76 | HR 89 | Temp 98.0°F

## 2018-05-21 DIAGNOSIS — M47816 Spondylosis without myelopathy or radiculopathy, lumbar region: Secondary | ICD-10-CM | POA: Diagnosis not present

## 2018-05-21 DIAGNOSIS — M961 Postlaminectomy syndrome, not elsewhere classified: Secondary | ICD-10-CM

## 2018-05-21 MED ORDER — BUPIVACAINE HCL 0.5 % IJ SOLN
3.0000 mL | Freq: Once | INTRAMUSCULAR | Status: AC
  Administered 2018-05-21: 3 mL

## 2018-05-21 NOTE — Patient Instructions (Signed)

## 2018-05-21 NOTE — Progress Notes (Signed)
 .  Numeric Pain Rating Scale and Functional Assessment Average Pain 7   In the last MONTH (on 0-10 scale) has pain interfered with the following?  1. General activity like being  able to carry out your everyday physical activities such as walking, climbing stairs, carrying groceries, or moving a chair?  Rating(3)   +Driver, -BT, -Dye Allergies.  

## 2018-06-08 NOTE — Progress Notes (Signed)
Amy [name] - 67 y.o. female MRN [id number]  Date of birth: Aug 16, 1950  Office Visit Note: Visit Date: 05/21/2018 PCP: System, Pcp Not In Referred by: No ref. provider found  Subjective: Chief Complaint  Patient presents with  . Lower Back - Pain   HPI:  Amy [name] is a 66 y.o. female who comes in today For planned diagnostic and hopefully therapeutic medial branch blocks of the bilateral L5-S1 facet joints.  She has a history of long-standing fusion from burst fracture and this can be reviewed in our notes.  She is had multiple medical issues over the years including cancer and stroke.  She used to do extremely well with lidocaine patches and then her insurance company would not cover those.  I think they are actually covering them now with a different insurance company.  Different insurance company however does not like the fact that we have completed facet joint injections therapeutically even though they have helped her for 6 months at a time.  I do think she could be a candidate for radiofrequency ablation.  We are going to complete diagnostic medial branch blocks with a goal towards ablation.  Please see our prior evaluation and management note for further details and justification.  ROS Otherwise per HPI.  Assessment & Plan: Visit Diagnoses:  1. Spondylosis without myelopathy or radiculopathy, lumbar region   2. Post laminectomy syndrome     Plan: No additional findings.   Meds & Orders:  Meds ordered this encounter  Medications  . bupivacaine (MARCAINE) 0.5 % (with pres) injection 3 mL    Orders Placed This Encounter  Procedures  . Facet Injection  . XR C-ARM NO REPORT    Follow-up: Return if symptoms worsen or fail to improve.   Procedures: No procedures performed  Lumbar Diagnostic Facet Joint Nerve Block with Fluoroscopic Guidance   Patient: Amy [name]      Date of Birth: [birth year] MRN: [id number] PCP: System, Pcp Not In      Visit  Date: 05/21/2018   Universal Protocol:    Date/Time: 10/28/195:25 AM  Consent Given By: the patient  Position: PRONE  Additional Comments: Vital signs were monitored before and after the procedure. Patient was prepped and draped in the usual sterile fashion. The correct patient, procedure, and site was verified.   Injection Procedure Details:  Procedure Site One Meds Administered:  Meds ordered this encounter  Medications  . bupivacaine (MARCAINE) 0.5 % (with pres) injection 3 mL     Laterality: Bilateral  Location/Site:  L5-S1  Needle size: 22 ga.  Needle type:spinal  Needle Placement: Oblique pedical  Findings:   -Comments: There was excellent flow of contrast along the articular pillars without intravascular flow.  Procedure Details: The fluoroscope beam is vertically oriented in AP and then obliqued 15 to 20 degrees to the ipsilateral side of the desired nerve to achieve the "Scotty dog" appearance.  The skin over the target area of the junction of the superior articulating process and the transverse process (sacral ala if blocking the L5 dorsal rami) was locally anesthetized with a 1 ml volume of 1% Lidocaine without Epinephrine.  The spinal needle was inserted and advanced in a trajectory view down to the target.   After contact with periosteum and negative aspirate for blood and CSF, correct placement without intravascular or epidural spread was confirmed by injecting 0.5 ml. of Isovue-250.  A spot radiograph was obtained of this image.    Next, a 0.5  ml. volume of the injectate described above was injected. The needle was then redirected to the other facet joint nerves mentioned above if needed.  Prior to the procedure, the patient was given a Pain Diary which was completed for baseline measurements.  After the procedure, the patient rated their pain every 30 minutes and will continue rating at this frequency for a total of 5 hours.  The patient has been asked to  complete the Diary and return to [place] by mail, fax or hand delivered as soon as possible.   Additional Comments:  The patient tolerated the procedure well Dressing: Band-Aid    Post-procedure details: Patient was observed during the procedure. Post-procedure instructions were reviewed.  Patient left the clinic in stable condition.   Clinical History: CT Myelogram 2007L3-L4: Posterior fusion hardware intact. No  spinal or foraminal stenosis. Interspace unremarkable.   L4-L5: Bilateral laminar hooks at L4 intact. These result in some encroachment on the posterior aspect of thecal sac and contribute to mild   narrowing in the anteroposterior dimension. Lateral   and subarticular recesses remain patent. No significant disc bulge or protrusion. Foramina remain widely patent. Mild degenerative changes in the facet joints bilaterally without significant hypertrophy.   L5-S1: Moderately advanced bilateral facet degenerative hypertrophy. Interspace is unremarkable. Neural foramina widely patent.   Impression: 1. Posterior spinal fusion T11-L4 without apparent complication. 2. Bilateral facet degenerative hypertrophy, mild L4-L5, more advanced L5-S1.     Objective:  VS:  HT:    WT:   BMI:     BP:124/76  HR:89bpm  TEMP:98 F (36.7 C)(Oral)  RESP:  Physical Exam  Ortho Exam Imaging: No results found.

## 2018-06-08 NOTE — Procedures (Signed)
Lumbar Diagnostic Facet Joint Nerve Block with Fluoroscopic Guidance   Patient: Amy [name]      Date of Birth: 08/11/1951 MRN: [id number] PCP: System, Pcp Not In      Visit Date: 05/21/2018   Universal Protocol:    Date/Time: 10/28/195:25 AM  Consent Given By: the patient  Position: PRONE  Additional Comments: Vital signs were monitored before and after the procedure. Patient was prepped and draped in the usual sterile fashion. The correct patient, procedure, and site was verified.   Injection Procedure Details:  Procedure Site One Meds Administered:  Meds ordered this encounter  Medications  . bupivacaine (MARCAINE) 0.5 % (with pres) injection 3 mL     Laterality: Bilateral  Location/Site:  L5-S1  Needle size: 22 ga.  Needle type:spinal  Needle Placement: Oblique pedical  Findings:   -Comments: There was excellent flow of contrast along the articular pillars without intravascular flow.  Procedure Details: The fluoroscope beam is vertically oriented in AP and then obliqued 15 to 20 degrees to the ipsilateral side of the desired nerve to achieve the "Scotty dog" appearance.  The skin over the target area of the junction of the superior articulating process and the transverse process (sacral ala if blocking the L5 dorsal rami) was locally anesthetized with a 1 ml volume of 1% Lidocaine without Epinephrine.  The spinal needle was inserted and advanced in a trajectory view down to the target.   After contact with periosteum and negative aspirate for blood and CSF, correct placement without intravascular or epidural spread was confirmed by injecting 0.5 ml. of Isovue-250.  A spot radiograph was obtained of this image.    Next, a 0.5 ml. volume of the injectate described above was injected. The needle was then redirected to the other facet joint nerves mentioned above if needed.  Prior to the procedure, the patient was given a Pain Diary which was completed for  baseline measurements.  After the procedure, the patient rated their pain every 30 minutes and will continue rating at this frequency for a total of 5 hours.  The patient has been asked to complete the Diary and return to [place] by mail, fax or hand delivered as soon as possible.   Additional Comments:  The patient tolerated the procedure well Dressing: Band-Aid    Post-procedure details: Patient was observed during the procedure. Post-procedure instructions were reviewed.  Patient left the clinic in stable condition.

## 2018-10-25 ENCOUNTER — Other Ambulatory Visit: Payer: Self-pay | Admitting: Family Medicine

## 2018-10-25 DIAGNOSIS — I10 Essential (primary) hypertension: Secondary | ICD-10-CM

## 2018-10-26 NOTE — Telephone Encounter (Signed)
Attempted to call patient to schedule an appointment for refills. To see if she was planning on staying at PCP or continuing to see Daphane Shepherd at [place]. No answer, left message to call the office back and clarify.

## 2019-01-14 ENCOUNTER — Other Ambulatory Visit: Payer: Self-pay | Admitting: Physician Assistant

## 2019-01-14 DIAGNOSIS — Z1231 Encounter for screening mammogram for malignant neoplasm of breast: Secondary | ICD-10-CM

## 2019-01-19 DIAGNOSIS — R7303 Prediabetes: Secondary | ICD-10-CM | POA: Diagnosis not present

## 2019-01-19 DIAGNOSIS — F331 Major depressive disorder, recurrent, moderate: Secondary | ICD-10-CM | POA: Diagnosis not present

## 2019-01-19 DIAGNOSIS — I10 Essential (primary) hypertension: Secondary | ICD-10-CM | POA: Diagnosis not present

## 2019-01-19 DIAGNOSIS — E785 Hyperlipidemia, unspecified: Secondary | ICD-10-CM | POA: Diagnosis not present

## 2019-01-28 DIAGNOSIS — G5762 Lesion of plantar nerve, left lower limb: Secondary | ICD-10-CM | POA: Diagnosis not present

## 2019-01-28 DIAGNOSIS — M7752 Other enthesopathy of left foot: Secondary | ICD-10-CM | POA: Diagnosis not present

## 2019-02-02 ENCOUNTER — Telehealth: Payer: Self-pay | Admitting: Physical Medicine and Rehabilitation

## 2019-02-03 NOTE — Telephone Encounter (Signed)
Yes ok 

## 2019-02-03 NOTE — Telephone Encounter (Signed)
Left message #1

## 2019-02-03 NOTE — Telephone Encounter (Signed)
/  s Josem Kaufmann needed for bilateral [phone]? Patient now has [place] ID# [id number]. Scheduled for

## 2019-02-09 NOTE — Telephone Encounter (Signed)
No PA is needed for [id number]. Reference # [id number]

## 2019-02-11 DIAGNOSIS — M7752 Other enthesopathy of left foot: Secondary | ICD-10-CM | POA: Diagnosis not present

## 2019-02-11 DIAGNOSIS — G5762 Lesion of plantar nerve, left lower limb: Secondary | ICD-10-CM | POA: Diagnosis not present

## 2019-02-18 ENCOUNTER — Other Ambulatory Visit: Payer: Self-pay

## 2019-02-18 ENCOUNTER — Ambulatory Visit (INDEPENDENT_AMBULATORY_CARE_PROVIDER_SITE_OTHER): Payer: BC Managed Care – PPO | Admitting: Physical Medicine and Rehabilitation

## 2019-02-18 ENCOUNTER — Ambulatory Visit: Payer: Self-pay

## 2019-02-18 VITALS — BP 131/82 | HR 78

## 2019-02-18 DIAGNOSIS — M7061 Trochanteric bursitis, right hip: Secondary | ICD-10-CM

## 2019-02-18 DIAGNOSIS — M47816 Spondylosis without myelopathy or radiculopathy, lumbar region: Secondary | ICD-10-CM | POA: Diagnosis not present

## 2019-02-18 MED ORDER — METHYLPREDNISOLONE ACETATE 80 MG/ML IJ SUSP
80.0000 mg | Freq: Once | INTRAMUSCULAR | Status: AC
  Administered 2019-02-18: 80 mg

## 2019-02-18 NOTE — Progress Notes (Signed)
 .  Numeric Pain Rating Scale and Functional Assessment Average Pain 8   In the last MONTH (on 0-10 scale) has pain interfered with the following?  1. General activity like being  able to carry out your everyday physical activities such as walking, climbing stairs, carrying groceries, or moving a chair?  Rating(7)   +Driver, -BT, -Dye Allergies.  

## 2019-02-18 NOTE — Progress Notes (Signed)
INDY PRESTWOOD - 68 y.o. female MRN [id number]  Date of birth: February 02, 1951  Office Visit Note: Visit Date: 02/18/2019 PCP: System, Pcp Not In Referred by: No ref. provider found  Subjective: Chief Complaint  Patient presents with  . Lower Back - Pain   HPI: Amy [name] is a 68 y.o. female who comes in today For planned repeat medial branch/facet joint block at L5-S1 below her lungs and fusion.  We also have her here for planned right greater trochanter bursitis injection under fluoroscopic guidance.  Her case is well-known to me has we have been seeing her through Dr. Basil Dess and also more recently as routine intermittent follow-ups in our clinic.  We have been seeing her for many years.  She has had longstanding fusion from prior burst fracture has gone on to have multiple complications with brain tumor and resection and stroke as well as history of cancer unrelated to brain tumor.  Her case is complicated by recurrent depression.  She does extremely well with facet joint/medial branch blocks below the fusion.  She has had ablation performed in the past but has done just as well lately with injection.  She has had an ongoing struggle with bursitis on the right and comes in today with similar pain on the right greater trochanter.  She reports having trouble laying on that side and she does have pain today over the greater trochanter that is concordant with her pain.  Her back pain is worse with standing and ambulating she has no radicular pain.  Last time we saw the patient was in October and similar injection was beneficial so we will repeat this today.  If she does not get much relief we would go further diagnostically after that.  She is reported no focal weakness no unexplained weight loss no fevers chills or night sweats etc.  ROS Otherwise per HPI.  Assessment & Plan: Visit Diagnoses:  1. Spondylosis without myelopathy or radiculopathy, lumbar region   2. Trochanteric  bursitis, right hip     Plan: No additional findings.   Meds & Orders:  Meds ordered this encounter  Medications  . methylPREDNISolone acetate (DEPO-MEDROL) injection 80 mg    Orders Placed This Encounter  Procedures  . Large Joint Inj: R greater trochanter  . Facet Injection  . XR C-ARM NO REPORT    Follow-up: No follow-ups on file.   Procedures: Large Joint Inj: R greater trochanter on 02/18/2019 1:35 PM Indications: pain and diagnostic evaluation Details: 22 G 3.5 in needle, fluoroscopy-guided lateral approach  Arthrogram: No  Medications: 4 mL bupivacaine 0.25 %; 4 mL lidocaine 2 %; 80 mg triamcinolone acetonide 40 MG/ML Outcome: tolerated well, no immediate complications  There was excellent flow of contrast outlined the greater trochanteric bursa without vascular uptake. Procedure, treatment alternatives, risks and benefits explained, specific risks discussed. Consent was given by the patient. Immediately prior to procedure a time out was called to verify the correct patient, procedure, equipment, support staff and site/side marked as required. Patient was prepped and draped in the usual sterile fashion.      Lumbar Diagnostic Facet Joint Nerve Block with Fluoroscopic Guidance   Patient: Amy [name]      Date of Birth: 1950/12/28 MRN: [id number] PCP: System, Pcp Not In      Visit Date: 02/18/2019   Universal Protocol:    Date/Time: 07/13/205:55 AM  Consent Given By: the patient  Position: PRONE  Additional Comments: Vital signs were monitored before  and after the procedure. Patient was prepped and draped in the usual sterile fashion. The correct patient, procedure, and site was verified.   Injection Procedure Details:  Procedure Site One Meds Administered:  Meds ordered this encounter  Medications  . methylPREDNISolone acetate (DEPO-MEDROL) injection 80 mg     Laterality: Bilateral  Location/Site:  L5-S1  Needle size: 22 ga.  Needle  type:spinal  Needle Placement: Oblique pedical  Findings:   -Comments: There was excellent flow of contrast along the articular pillars without intravascular flow.  Procedure Details: The fluoroscope beam is vertically oriented in AP and then obliqued 15 to 20 degrees to the ipsilateral side of the desired nerve to achieve the "Scotty dog" appearance.  The skin over the target area of the junction of the superior articulating process and the transverse process (sacral ala if blocking the L5 dorsal rami) was locally anesthetized with a 1 ml volume of 1% Lidocaine without Epinephrine.  The spinal needle was inserted and advanced in a trajectory view down to the target.   After contact with periosteum and negative aspirate for blood and CSF, correct placement without intravascular or epidural spread was confirmed by injecting 0.5 ml. of Isovue-250.  A spot radiograph was obtained of this image.    Next, a 0.5 ml. volume of the injectate described above was injected. The needle was then redirected to the other facet joint nerves mentioned above if needed.  Prior to the procedure, the patient was given a Pain Diary which was completed for baseline measurements.  After the procedure, the patient rated their pain every 30 minutes and will continue rating at this frequency for a total of 5 hours.  The patient has been asked to complete the Diary and return to [place] by mail, fax or hand delivered as soon as possible.   Additional Comments:  The patient tolerated the procedure well Dressing: 2 x 2 sterile gauze and Band-Aid    Post-procedure details: Patient was observed during the procedure. Post-procedure instructions were reviewed.  Patient left the clinic in stable condition.   Clinical History: CT Myelogram 2007L3-L4: Posterior fusion hardware intact. No  spinal or foraminal stenosis. Interspace unremarkable.   L4-L5: Bilateral laminar hooks at L4 intact. These result in some encroachment  on the posterior aspect of thecal sac and contribute to mild   narrowing in the anteroposterior dimension. Lateral   and subarticular recesses remain patent. No significant disc bulge or protrusion. Foramina remain widely patent. Mild degenerative changes in the facet joints bilaterally without significant hypertrophy.   L5-S1: Moderately advanced bilateral facet degenerative hypertrophy. Interspace is unremarkable. Neural foramina widely patent.   Impression: 1. Posterior spinal fusion T11-L4 without apparent complication. 2. Bilateral facet degenerative hypertrophy, mild L4-L5, more advanced L5-S1.   She reports that she quit smoking about 23 years ago. Her smoking use included cigarettes. She has a 5.00 pack-year smoking history. She has never used smokeless tobacco. No results for input(s): HGBA1C, LABURIC in the last 8760 hours.  Objective:  VS:  HT:    WT:   BMI:     BP:131/82  HR:78bpm  TEMP: ( )  RESP:97 % Physical Exam  Ortho Exam Imaging: No results found.  Past Medical/Family/Surgical/Social History: Medications & Allergies reviewed per EMR, new medications updated. Patient Active Problem List   Diagnosis Date Noted  . Seasonal allergic rhinitis due to pollen 12/26/2017  . Prediabetes 04/11/2017  . Dislocated patella 02/21/2017  . Trochanteric bursitis, right hip 09/24/2016  . Myofascitis 09/24/2016  .  Post laminectomy syndrome 09/24/2016  . Chronic pain syndrome 09/24/2016  . Spondylosis without myelopathy or radiculopathy, lumbar region 07/30/2016  . Anxiety and depression 01/02/2016  . Hx of breast reconstruction 09/27/2015  . Breast cancer of upper-outer quadrant of left female breast ([place]) 09/09/2014  . HTN (hypertension) 12/24/2012  . GERD (gastroesophageal reflux disease) 12/24/2012  . Hearing loss in left ear 12/24/2012  . 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 Insomnia 12/24/2012  . Diverticulosis 12/24/2012  . Personal history of colonic polyps 12/24/2012  .  Hyperlipidemia 12/24/2012  . BCC (basal cell carcinoma), arm 12/24/2012   Past Medical History:  Diagnosis Date  . Allergy   . 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 Anxiety    "sometimes" (09/23/2014)  . Cancer of left breast ([place]) 08/2014   left upper outer  . Cerebral meningioma ([place]) 09/2012   auditory; s/p excision  . Chronic lower back pain   . Chronic pain in left foot   . Deafness in left ear   . Facial droop    "left side; from brain tumor OR"  . GERD (gastroesophageal reflux disease)   . History of chemotherapy    last treatment 03/02/2015  . History of hiatal hernia   . Hypertension   . Migraine    "before brain tumor was removed" (09/23/2014)  . Personal history of chemotherapy 09/12/2014  . PONV (postoperative nausea and vomiting)    "severe"  . Stress incontinence    Family History  Adopted: Yes  Problem Relation Age of Onset  . Hypertension Father    Past Surgical History:  Procedure Laterality Date  . AUGMENTATION MAMMAPLASTY Bilateral 11/11/2015  . BACK SURGERY    . BRAIN SURGERY  2014   cerebellopontine angle tumor with craniotomy   . BREAST BIOPSY Left 08/2014  . BREAST RECONSTRUCTION Left 05/18/2015  . BREAST RECONSTRUCTION WITH PLACEMENT OF TISSUE EXPANDER AND FLEX HD (ACELLULAR HYDRATED DERMIS) Left 05/18/2015   Procedure: LEFT BREAST RECONSTRUCTION WITH PLACEMENT OF TISSUE EXPANDER AND ACELLULAR HYDRATED DERMIS MATRIX;  Surgeon: [name], MD;  Location: [facility];  Service: Plastics;  Laterality: Left;  . COLONOSCOPY    . FOOT FRACTURE SURGERY Left 1990's  . FOOT SURGERY Left    "one the muscle; trying to roll foot inward after failed recovery S/P fracture"  . FRACTURE SURGERY    . JOINT REPLACEMENT    . KNEE ARTHROSCOPY Left   . MASTECTOMY COMPLETE / SIMPLE W/ SENTINEL NODE BIOPSY Left 09/23/2014  . MULTIPLE TOOTH EXTRACTIONS    . PARTIAL KNEE ARTHROPLASTY Left 2000's    Dr. Sharol Given  . PATELLAR TENDON REPAIR  05/27/2012    Procedure: PATELLA TENDON REPAIR;  Surgeon: Newt Minion, MD;  Location: [facility];  Service: Orthopedics;  Laterality: Right;  Right Patella Tendon  Reconstruction with Semi Tendonosum  . PORT-A-CATH REMOVAL Right 03/16/2015   Procedure: REMOVAL PORT-A-CATH;  Surgeon: Fanny Skates, MD;  Location: [facility];  Service: General;  Laterality: Right;  . PORTACATH PLACEMENT Right 09/23/2014  . PORTACATH PLACEMENT Right 09/23/2014   Procedure: INSERTION PORT-A-CATH RIGHT SUBCLAVIAN;  Surgeon: Fanny Skates, MD;  Location: [facility];  Service: General;  Laterality: Right;  . POSTERIOR LUMBAR FUSION  02/1988   L2 reconstruction; fusion  . SIMPLE MASTECTOMY WITH AXILLARY SENTINEL NODE BIOPSY Left 09/23/2014   Procedure: LEFT TOTAL MASTECTOMY WITH LEFT AXILLARY SENTINEL LYMPH NODE BIOPSY;  Surgeon: Fanny Skates, MD;  Location: [facility];  Service: General;  Laterality: Left;  . TOTAL ABDOMINAL HYSTERECTOMY  12/1976  . TOTAL KNEE ARTHROPLASTY Right 05/24/2009    Dr. Sharol Given  . UPPER GASTROINTESTINAL ENDOSCOPY    . WISDOM TOOTH EXTRACTION     Social History   Occupational History  . Occupation: disability    Comment: HVAC  Tobacco Use  . Smoking status: Former Smoker    Packs/day: 0.25    Years: 20.00    Pack years: 5.00    Types: Cigarettes    Quit date: 05/22/1995    Years since quitting: 23.7  . Smokeless tobacco: Never Used  Substance and Sexual Activity  . Alcohol use: No  . Drug use: No  . Sexual activity: Yes    Partners: Male    Birth control/protection: None    Comment: menarche age 88, P 0 ,HRT x 42 yrs

## 2019-02-22 MED ORDER — TRIAMCINOLONE ACETONIDE 40 MG/ML IJ SUSP
80.0000 mg | INTRAMUSCULAR | Status: AC | PRN
  Administered 2019-02-18: 14:00:00 80 mg via INTRA_ARTICULAR

## 2019-02-22 MED ORDER — LIDOCAINE HCL 2 % IJ SOLN
4.0000 mL | INTRAMUSCULAR | Status: AC | PRN
  Administered 2019-02-18: 4 mL

## 2019-02-22 MED ORDER — BUPIVACAINE HCL 0.25 % IJ SOLN
4.0000 mL | INTRAMUSCULAR | Status: AC | PRN
  Administered 2019-02-18: 14:00:00 4 mL via INTRA_ARTICULAR

## 2019-02-22 NOTE — Procedures (Signed)
Lumbar Diagnostic Facet Joint Nerve Block with Fluoroscopic Guidance   Patient: Amy [name]      Date of Birth: 1951/02/06 MRN: [id number] PCP: System, Pcp Not In      Visit Date: 02/18/2019   Universal Protocol:    Date/Time: 07/13/205:55 AM  Consent Given By: the patient  Position: PRONE  Additional Comments: Vital signs were monitored before and after the procedure. Patient was prepped and draped in the usual sterile fashion. The correct patient, procedure, and site was verified.   Injection Procedure Details:  Procedure Site One Meds Administered:  Meds ordered this encounter  Medications  . methylPREDNISolone acetate (DEPO-MEDROL) injection 80 mg     Laterality: Bilateral  Location/Site:  L5-S1  Needle size: 22 ga.  Needle type:spinal  Needle Placement: Oblique pedical  Findings:   -Comments: There was excellent flow of contrast along the articular pillars without intravascular flow.  Procedure Details: The fluoroscope beam is vertically oriented in AP and then obliqued 15 to 20 degrees to the ipsilateral side of the desired nerve to achieve the "Scotty dog" appearance.  The skin over the target area of the junction of the superior articulating process and the transverse process (sacral ala if blocking the L5 dorsal rami) was locally anesthetized with a 1 ml volume of 1% Lidocaine without Epinephrine.  The spinal needle was inserted and advanced in a trajectory view down to the target.   After contact with periosteum and negative aspirate for blood and CSF, correct placement without intravascular or epidural spread was confirmed by injecting 0.5 ml. of Isovue-250.  A spot radiograph was obtained of this image.    Next, a 0.5 ml. volume of the injectate described above was injected. The needle was then redirected to the other facet joint nerves mentioned above if needed.  Prior to the procedure, the patient was given a Pain Diary which was completed for  baseline measurements.  After the procedure, the patient rated their pain every 30 minutes and will continue rating at this frequency for a total of 5 hours.  The patient has been asked to complete the Diary and return to [place] by mail, fax or hand delivered as soon as possible.   Additional Comments:  The patient tolerated the procedure well Dressing: 2 x 2 sterile gauze and Band-Aid    Post-procedure details: Patient was observed during the procedure. Post-procedure instructions were reviewed.  Patient left the clinic in stable condition.

## 2019-02-24 DIAGNOSIS — M2042 Other hammer toe(s) (acquired), left foot: Secondary | ICD-10-CM | POA: Diagnosis not present

## 2019-02-24 DIAGNOSIS — M7752 Other enthesopathy of left foot: Secondary | ICD-10-CM | POA: Diagnosis not present

## 2019-02-24 DIAGNOSIS — M25572 Pain in left ankle and joints of left foot: Secondary | ICD-10-CM | POA: Diagnosis not present

## 2019-03-02 ENCOUNTER — Other Ambulatory Visit: Payer: Self-pay

## 2019-03-02 ENCOUNTER — Ambulatory Visit
Admission: RE | Admit: 2019-03-02 | Discharge: 2019-03-02 | Disposition: A | Discharge status: Home / Self Care | Payer: Medicare Other | Source: Ambulatory Visit | Attending: Physician Assistant | Admitting: Physician Assistant

## 2019-03-02 DIAGNOSIS — Z1231 Encounter for screening mammogram for malignant neoplasm of breast: Secondary | ICD-10-CM | POA: Diagnosis not present

## 2019-03-09 DIAGNOSIS — M25572 Pain in left ankle and joints of left foot: Secondary | ICD-10-CM | POA: Diagnosis not present

## 2019-03-09 DIAGNOSIS — M7752 Other enthesopathy of left foot: Secondary | ICD-10-CM | POA: Diagnosis not present

## 2019-03-15 DIAGNOSIS — C50912 Malignant neoplasm of unspecified site of left female breast: Secondary | ICD-10-CM | POA: Diagnosis not present

## 2019-03-23 DIAGNOSIS — M7752 Other enthesopathy of left foot: Secondary | ICD-10-CM | POA: Diagnosis not present

## 2019-03-23 DIAGNOSIS — G5762 Lesion of plantar nerve, left lower limb: Secondary | ICD-10-CM | POA: Diagnosis not present

## 2019-04-05 DIAGNOSIS — I10 Essential (primary) hypertension: Secondary | ICD-10-CM | POA: Diagnosis not present

## 2019-04-05 DIAGNOSIS — M25511 Pain in right shoulder: Secondary | ICD-10-CM | POA: Diagnosis not present

## 2019-04-22 DIAGNOSIS — Z79899 Other long term (current) drug therapy: Secondary | ICD-10-CM | POA: Diagnosis not present

## 2019-04-22 DIAGNOSIS — Z79891 Long term (current) use of opiate analgesic: Secondary | ICD-10-CM | POA: Diagnosis not present

## 2019-04-22 DIAGNOSIS — E785 Hyperlipidemia, unspecified: Secondary | ICD-10-CM | POA: Diagnosis not present

## 2019-04-22 DIAGNOSIS — I10 Essential (primary) hypertension: Secondary | ICD-10-CM | POA: Diagnosis not present

## 2019-04-22 DIAGNOSIS — R7303 Prediabetes: Secondary | ICD-10-CM | POA: Diagnosis not present

## 2019-04-26 DIAGNOSIS — M7752 Other enthesopathy of left foot: Secondary | ICD-10-CM | POA: Diagnosis not present

## 2019-04-26 DIAGNOSIS — G5762 Lesion of plantar nerve, left lower limb: Secondary | ICD-10-CM | POA: Diagnosis not present

## 2019-05-17 DIAGNOSIS — I10 Essential (primary) hypertension: Secondary | ICD-10-CM | POA: Diagnosis not present

## 2019-05-17 DIAGNOSIS — M25511 Pain in right shoulder: Secondary | ICD-10-CM | POA: Diagnosis not present

## 2019-05-24 DIAGNOSIS — M85852 Other specified disorders of bone density and structure, left thigh: Secondary | ICD-10-CM | POA: Diagnosis not present

## 2019-05-24 DIAGNOSIS — E2839 Other primary ovarian failure: Secondary | ICD-10-CM | POA: Diagnosis not present

## 2019-05-24 DIAGNOSIS — M85831 Other specified disorders of bone density and structure, right forearm: Secondary | ICD-10-CM | POA: Diagnosis not present

## 2019-05-25 DIAGNOSIS — I10 Essential (primary) hypertension: Secondary | ICD-10-CM | POA: Diagnosis not present

## 2019-05-25 DIAGNOSIS — E2839 Other primary ovarian failure: Secondary | ICD-10-CM | POA: Diagnosis not present

## 2019-05-25 DIAGNOSIS — D649 Anemia, unspecified: Secondary | ICD-10-CM | POA: Diagnosis not present

## 2019-05-31 DIAGNOSIS — M7752 Other enthesopathy of left foot: Secondary | ICD-10-CM | POA: Diagnosis not present

## 2019-05-31 DIAGNOSIS — G5762 Lesion of plantar nerve, left lower limb: Secondary | ICD-10-CM | POA: Diagnosis not present

## 2019-07-10 ENCOUNTER — Other Ambulatory Visit: Payer: Self-pay

## 2019-07-10 ENCOUNTER — Encounter: Payer: Self-pay | Admitting: Emergency Medicine

## 2019-07-10 ENCOUNTER — Emergency Department (INDEPENDENT_AMBULATORY_CARE_PROVIDER_SITE_OTHER)
Admission: EM | Admit: 2019-07-10 | Discharge: 2019-07-10 | Disposition: A | Discharge status: Home / Self Care | Payer: BC Managed Care – PPO | Source: Home / Self Care

## 2019-07-10 DIAGNOSIS — M79672 Pain in left foot: Secondary | ICD-10-CM | POA: Diagnosis not present

## 2019-07-10 DIAGNOSIS — B370 Candidal stomatitis: Secondary | ICD-10-CM

## 2019-07-10 MED ORDER — MAGIC MOUTHWASH: 5.0000 mL | Freq: Four times a day (QID) | ORAL | 0 refills | Status: AC

## 2019-07-10 NOTE — Discharge Instructions (Addendum)
Keep follow-up appointment with podiatrist on Monday. Continue antibiotic. Use Ace wrap as needed for swelling, pain. Return for worsening pain, swelling, fever, difficulty bearing weight.

## 2019-07-10 NOTE — ED Triage Notes (Signed)
Patient presents to [facility] with C/O pain in the left foot. She states that she had an injection in the top of the left foot. 10 days ago, since that time the pain has increased and redness and edema present.

## 2019-07-10 NOTE — ED Provider Notes (Signed)
[facility]    CSN: [id number] Arrival date & time: 07/10/19  1249      History   Chief Complaint Chief Complaint  Patient presents with  . Foot Pain    HPI Amy [name] is a 68 y.o. female with left foot pain status post injection done by podiatry 10 days ago.  Understanding erythema, swelling.  Tried wrapping with Ace wrap, has been taking Keflex as prescribed by her podiatrist.  States she has follow-up Monday.  Patient states there has been some improvement since starting Keflex.  No fever, pain with weightbearing.  Patient also noting oral thrush since starting Keflex: States this happens frequently.  Typically is prescribed [place] Magic mouthwash with adequate relief.  Past Medical History:  Diagnosis Date  . Allergy   . 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 Anxiety    "sometimes" (09/23/2014)  . Cancer of left breast ([place]) 08/2014   left upper outer  . Cerebral meningioma ([place]) 09/2012   auditory; s/p excision  . Chronic lower back pain   . Chronic pain in left foot   . Deafness in left ear   . Facial droop    "left side; from brain tumor OR"  . GERD (gastroesophageal reflux disease)   . History of chemotherapy    last treatment 03/02/2015  . History of hiatal hernia   . Hypertension   . Migraine    "before brain tumor was removed" (09/23/2014)  . Personal history of chemotherapy 09/12/2014  . PONV (postoperative nausea and vomiting)    "severe"  . Stress incontinence     Patient Active Problem List   Diagnosis Date Noted  . Seasonal allergic rhinitis due to pollen 12/26/2017  . Prediabetes 04/11/2017  . Dislocated patella 02/21/2017  . Trochanteric bursitis, right hip 09/24/2016  . Myofascitis 09/24/2016  . Post laminectomy syndrome 09/24/2016  . Chronic pain syndrome 09/24/2016  . Spondylosis without myelopathy or radiculopathy, lumbar region 07/30/2016  . Anxiety and depression 01/02/2016  . Hx of breast  reconstruction 09/27/2015  . Breast cancer of upper-outer quadrant of left female breast ([place]) 09/09/2014  . HTN (hypertension) 12/24/2012  . GERD (gastroesophageal reflux disease) 12/24/2012  . Hearing loss in left ear 12/24/2012  . 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 Insomnia 12/24/2012  . Diverticulosis 12/24/2012  . Personal history of colonic polyps 12/24/2012  . Hyperlipidemia 12/24/2012  . BCC (basal cell carcinoma), arm 12/24/2012    Past Surgical History:  Procedure Laterality Date  . AUGMENTATION MAMMAPLASTY Bilateral 11/11/2015  . BACK SURGERY    . BRAIN SURGERY  2014   cerebellopontine angle tumor with craniotomy   . BREAST BIOPSY Left 08/2014  . BREAST RECONSTRUCTION Left 05/18/2015  . BREAST RECONSTRUCTION WITH PLACEMENT OF TISSUE EXPANDER AND FLEX HD (ACELLULAR HYDRATED DERMIS) Left 05/18/2015   Procedure: LEFT BREAST RECONSTRUCTION WITH PLACEMENT OF TISSUE EXPANDER AND ACELLULAR HYDRATED DERMIS MATRIX;  Surgeon: [name], MD;  Location: [place];  Service: Plastics;  Laterality: Left;  . COLONOSCOPY    . FOOT FRACTURE SURGERY Left 1990's  . FOOT SURGERY Left    "one the muscle; trying to roll foot inward after failed recovery S/P fracture"  . FRACTURE SURGERY    . JOINT REPLACEMENT    . KNEE ARTHROSCOPY Left   . MASTECTOMY COMPLETE / SIMPLE W/ SENTINEL NODE BIOPSY Left 09/23/2014  . MULTIPLE TOOTH EXTRACTIONS    . PARTIAL KNEE ARTHROPLASTY Left 2000's  Dr. Sharol Given  . PATELLAR TENDON REPAIR  05/27/2012   Procedure: PATELLA TENDON REPAIR;  Surgeon: Newt Minion, MD;  Location: [place];  Service: Orthopedics;  Laterality: Right;  Right Patella Tendon  Reconstruction with Semi Tendonosum  . PORT-A-CATH REMOVAL Right 03/16/2015   Procedure: REMOVAL PORT-A-CATH;  Surgeon: Fanny Skates, MD;  Location: [place];  Service: General;  Laterality: Right;  . PORTACATH PLACEMENT Right 09/23/2014  . PORTACATH PLACEMENT Right 09/23/2014   Procedure: INSERTION PORT-A-CATH RIGHT SUBCLAVIAN;   Surgeon: Fanny Skates, MD;  Location: [place];  Service: General;  Laterality: Right;  . POSTERIOR LUMBAR FUSION  02/1988   L2 reconstruction; fusion  . SIMPLE MASTECTOMY WITH AXILLARY SENTINEL NODE BIOPSY Left 09/23/2014   Procedure: LEFT TOTAL MASTECTOMY WITH LEFT AXILLARY SENTINEL LYMPH NODE BIOPSY;  Surgeon: Fanny Skates, MD;  Location: [place];  Service: General;  Laterality: Left;  . TOTAL ABDOMINAL HYSTERECTOMY  12/1976  . TOTAL KNEE ARTHROPLASTY Right 05/24/2009    Dr. Sharol Given  . UPPER GASTROINTESTINAL ENDOSCOPY    . WISDOM TOOTH EXTRACTION      OB History   No obstetric history on file.      Home Medications    Prior to Admission medications   Medication Sig Start Date End Date Taking? Authorizing Provider  ALPRAZolam (XANAX) 0.25 MG tablet Take 1 tablet (0.25 mg total) by mouth 2 (two) times daily as needed. 12/26/17   [name], PA  ALPRAZolam (XANAX) 0.25 MG tablet Take 1 tablet (0.25 mg total) by mouth 2 (two) times daily as needed for anxiety. 12/26/17   [name], PA  ALPRAZolam (XANAX) 0.25 MG tablet Take 1 tablet (0.25 mg total) by mouth 2 (two) times daily as needed for anxiety. 12/26/17   [name], PA  ALPRAZolam (XANAX) 0.25 MG tablet Take 1 tablet (0.25 mg total) by mouth 2 (two) times daily as needed for anxiety. 12/26/17   [name], PA  citalopram (CELEXA) 40 MG tablet Take 1 tablet (40 mg total) by mouth daily. 12/26/17   [name], PA  HYDROcodone-acetaminophen (NORCO/VICODIN) 5-325 MG tablet  09/06/15   [provider]  ipratropium (ATROVENT) 0.03 % nasal spray Place 2 sprays into both nostrils 2 (two) times daily. 12/26/17   [name], PA  lansoprazole (PREVACID) 15 MG capsule Take 30 mg by mouth daily at 12 noon. Reported on 01/02/2016    [provider]  lisinopril (PRINIVIL,ZESTRIL) 20 MG tablet Take 1 tablet (20 mg total) by mouth daily. 12/26/17   [name], PA  magic mouthwash SOLN Take 5 mLs by mouth every  6 (six) hours for 7 days. 07/10/19 07/17/19  Hall-Potvin, [place], PA-C  metoprolol succinate (TOPROL-XL) 100 MG 24 hr tablet TAKE 1 TABLET BY MOUTH DAILY. TAKE WITH OR IMMEDIATELY FOLLOWING A MEAL. 01/29/18   Rutherford Guys, MD  zolpidem (AMBIEN) 10 MG tablet Take 1 tablet (10 mg total) by mouth at bedtime. 12/26/17   [name], PA  zolpidem (AMBIEN) 10 MG tablet Take 1 tablet (10 mg total) by mouth at bedtime as needed for sleep. 12/26/17 01/25/18  [name], PA  zolpidem (AMBIEN) 10 MG tablet Take 1 tablet (10 mg total) by mouth at bedtime as needed for sleep. 12/26/17 01/25/18  [name], PA  zolpidem (AMBIEN) 10 MG tablet Take 1 tablet (10 mg total) by mouth at bedtime as needed for sleep. 12/26/17 01/25/18  [name], PA    Family History Family History  Adopted: Yes  Problem Relation Age  of Onset  . Hypertension Father     Social History Social History   Tobacco Use  . Smoking status: Former Smoker    Packs/day: 0.25    Years: 20.00    Pack years: 5.00    Types: Cigarettes    Quit date: 05/22/1995    Years since quitting: 24.1  . Smokeless tobacco: Never Used  Substance Use Topics  . Alcohol use: No  . Drug use: No     Allergies   Codeine, Darvocet [propoxyphene n-acetaminophen], Tramadol, Gold-containing drug products, Meperidine, Morphine, Verapamil, and Sulfa antibiotics   Review of Systems Review of Systems  Constitutional: Negative for fatigue and fever.  Respiratory: Negative for cough and shortness of breath.   Cardiovascular: Negative for chest pain and palpitations.  Musculoskeletal:       Positive for left foot pain  Skin: Positive for wound. Negative for rash.  Neurological: Negative for weakness and numbness.     Physical Exam Triage Vital Signs ED Triage Vitals  Enc Vitals Group     BP 07/10/19 1307 (!) 144/88     Pulse Rate 07/10/19 1307 86     Resp 07/10/19 1307 16     Temp 07/10/19 1307 (!) 97.4 F (36.3 C)     Temp  Source 07/10/19 1307 Oral     SpO2 07/10/19 1307 95 %     Weight 07/10/19 1309 148 lb 1.9 oz (67.2 kg)     Height 07/10/19 1309 5\' 6"  (1.676 m)     Head Circumference --      Peak Flow --      Pain Score 07/10/19 1308 7     Pain Loc --      Pain Edu? --      Excl. in [place]? --    No data found.  Updated Vital Signs BP (!) 144/88 (BP Location: Right Arm)   Pulse 86   Temp (!) 97.4 F (36.3 C) (Oral)   Resp 16   Ht 5\' 6"  (1.676 m)   Wt 148 lb 1.9 oz (67.2 kg)   SpO2 95%   BMI 23.91 kg/m   Visual Acuity Right Eye Distance:   Left Eye Distance:   Bilateral Distance:    Right Eye Near:   Left Eye Near:    Bilateral Near:     Physical Exam Constitutional:      General: She is not in acute distress. HENT:     Head: Normocephalic and atraumatic.     Mouth/Throat:     Mouth: Mucous membranes are moist.     Comments: White filmy discharge over tongue, scant amounts over buccal mucosa. Eyes:     General: No scleral icterus.    Pupils: Pupils are equal, round, and reactive to light.  Cardiovascular:     Rate and Rhythm: Normal rate.  Pulmonary:     Effort: Pulmonary effort is normal.  Musculoskeletal: Normal range of motion.        General: No swelling.     Right lower leg: No edema.     Left lower leg: No edema.  Skin:    Coloration: Skin is not jaundiced or pale.     Comments: Left foot with area of resolving bruise, reddish discoloration.  No warmth, mildly tender to palpation.  Neurological:     Mental Status: She is alert and oriented to person, place, and time.      UC Treatments / Results  Labs (all labs ordered are listed, but only  abnormal results are displayed) Labs Reviewed - No data to display  EKG   Radiology No results found.  Procedures Procedures (including critical care time)  Medications Ordered in UC Medications - No data to display  Initial Impression / Assessment and Plan / UC Course  I have reviewed the triage vital signs and the  nursing notes.  Pertinent labs & imaging results that were available during my care of the patient were reviewed by me and considered in my medical decision making (see chart for details).     1.  Thrush Mouthwash sent.  2.  Left foot pain Patient reporting improvement status post Keflex use, has close follow-up with podiatry which she intends to keep.  No retraumatization, inability to bear weight: Radiography deferred.  Return precautions discussed, patient verbalized understanding and is agreeable to plan. Final Clinical Impressions(s) / UC Diagnoses   Final diagnoses:  Left foot pain     Discharge Instructions     Keep follow-up appointment with podiatrist on Monday. Continue antibiotic. Use Ace wrap as needed for swelling, pain. Return for worsening pain, swelling, fever, difficulty bearing weight.    ED Prescriptions    Medication Sig Dispense Auth. Provider   magic mouthwash SOLN Take 5 mLs by mouth every 6 (six) hours for 7 days. 140 mL Hall-Potvin, [place], PA-C     PDMP not reviewed this encounter.   Neldon [place], [place] 07/12/19 1934

## 2019-07-20 MED FILL — NUZYRA 150 MG TABS: 150 | 12 days supply | Qty: 22 | Fill #0

## 2019-09-04 ENCOUNTER — Ambulatory Visit: Payer: BC Managed Care – PPO | Attending: Internal Medicine

## 2019-09-04 DIAGNOSIS — Z23 Encounter for immunization: Secondary | ICD-10-CM

## 2019-09-04 NOTE — Progress Notes (Signed)
   Covid-19 Vaccination Clinic  Name:  Amy [name]    MRN: [id number] DOB: [birth year]  09/04/2019  [name] was observed post Covid-19 immunization for 15 minutes without incidence. She was provided with Vaccine Information Sheet and instruction to access the V-Safe system.   [name] was instructed to call 911 with any severe reactions post vaccine: Marland Kitchen Difficulty breathing  . Swelling of your face and throat  . A fast heartbeat  . A bad rash all over your body  . Dizziness and weakness    Immunizations Administered    Name Date Dose VIS Date Route   Pfizer COVID-19 Vaccine 09/04/2019  3:14 PM 0.3 mL 07/23/2019 Intramuscular   Manufacturer: [place]   Lot: [id number]   [place]: [id number]

## 2019-09-27 ENCOUNTER — Ambulatory Visit: Payer: BC Managed Care – PPO | Attending: Internal Medicine

## 2019-09-27 DIAGNOSIS — Z23 Encounter for immunization: Secondary | ICD-10-CM

## 2019-09-27 NOTE — Progress Notes (Signed)
   Covid-19 Vaccination Clinic  Name:  Amy [name]    MRN: [id number] DOB: 11/27/1950  09/27/2019  [name] was observed post Covid-19 immunization for 15 minutes without incidence. She was provided with Vaccine Information Sheet and instruction to access the V-Safe system.   [name] was instructed to call 911 with any severe reactions post vaccine: Marland Kitchen Difficulty breathing  . Swelling of your face and throat  . A fast heartbeat  . A bad rash all over your body  . Dizziness and weakness    Immunizations Administered    Name Date Dose VIS Date Route   Pfizer COVID-19 Vaccine 09/27/2019  9:39 AM 0.3 mL 07/23/2019 Intramuscular   Manufacturer: [place]   Lot: [id number]   [place]: [id number]

## 2019-10-08 ENCOUNTER — Telehealth: Payer: Self-pay | Admitting: Physical Medicine and Rehabilitation

## 2019-10-08 NOTE — Telephone Encounter (Signed)
Ok

## 2019-10-08 NOTE — Telephone Encounter (Signed)
Scheduled for 3/15 at 0915 with driver.

## 2019-10-08 NOTE — Telephone Encounter (Signed)
No PA is needed for [facility].

## 2019-10-08 NOTE — Telephone Encounter (Signed)
Is auth needed?

## 2019-10-13 ENCOUNTER — Ambulatory Visit (INDEPENDENT_AMBULATORY_CARE_PROVIDER_SITE_OTHER): Payer: BC Managed Care – PPO | Admitting: Physical Medicine and Rehabilitation

## 2019-10-13 ENCOUNTER — Other Ambulatory Visit: Payer: Self-pay

## 2019-10-13 ENCOUNTER — Encounter: Payer: Self-pay | Admitting: Physical Medicine and Rehabilitation

## 2019-10-13 ENCOUNTER — Ambulatory Visit: Payer: Self-pay

## 2019-10-13 VITALS — BP 129/85 | HR 75

## 2019-10-13 DIAGNOSIS — M47816 Spondylosis without myelopathy or radiculopathy, lumbar region: Secondary | ICD-10-CM

## 2019-10-13 DIAGNOSIS — M7061 Trochanteric bursitis, right hip: Secondary | ICD-10-CM

## 2019-10-13 MED ORDER — METHYLPREDNISOLONE ACETATE 80 MG/ML IJ SUSP
40.0000 mg | Freq: Once | INTRAMUSCULAR | Status: AC
  Administered 2019-10-13: 40 mg

## 2019-10-13 NOTE — Progress Notes (Signed)
Pt states pain in the center of the lower back and right bursa. Pt states pain started back about 1 month ago. Pt states last injection helped out a lot and lasted for a while. Sitting for a long time makes pain worse. Lidocaine patch, hot showers, and resting helps with pain.   .Numeric Pain Rating Scale and Functional Assessment Average Pain 5   In the last MONTH (on 0-10 scale) has pain interfered with the following?  1. General activity like being  able to carry out your everyday physical activities such as walking, climbing stairs, carrying groceries, or moving a chair?  Rating(6)   +Driver, -BT, -Dye Allergies.

## 2019-10-25 ENCOUNTER — Encounter: Payer: BC Managed Care – PPO | Admitting: Physical Medicine and Rehabilitation

## 2019-11-12 DIAGNOSIS — M7061 Trochanteric bursitis, right hip: Secondary | ICD-10-CM | POA: Diagnosis not present

## 2019-11-12 DIAGNOSIS — M47816 Spondylosis without myelopathy or radiculopathy, lumbar region: Secondary | ICD-10-CM | POA: Diagnosis not present

## 2019-11-12 MED ORDER — BUPIVACAINE HCL 0.25 % IJ SOLN
4.0000 mL | INTRAMUSCULAR | Status: AC | PRN
  Administered 2019-11-12: 06:00:00 4 mL via INTRA_ARTICULAR

## 2019-11-12 MED ORDER — LIDOCAINE HCL 2 % IJ SOLN
4.0000 mL | INTRAMUSCULAR | Status: AC | PRN
  Administered 2019-11-12: 4 mL

## 2019-11-12 MED ORDER — TRIAMCINOLONE ACETONIDE 40 MG/ML IJ SUSP
60.0000 mg | INTRAMUSCULAR | Status: AC | PRN
  Administered 2019-11-12: 06:00:00 60 mg via INTRA_ARTICULAR

## 2019-11-12 NOTE — Procedures (Signed)
Lumbar Facet Joint Intra-Articular Injection(s) with Fluoroscopic Guidance  Patient: Amy [name]      Date of Birth: 11/26/1950 MRN: [id number] PCP: System, Pcp Not In      Visit Date: 10/13/2019   Universal Protocol:    Date/Time: 10/13/2019  Consent Given By: the patient  Position: PRONE   Additional Comments: Vital signs were monitored before and after the procedure. Patient was prepped and draped in the usual sterile fashion. The correct patient, procedure, and site was verified.   Injection Procedure Details:  Procedure Site One Meds Administered:  Meds ordered this encounter  Medications  . methylPREDNISolone acetate (DEPO-MEDROL) injection 40 mg     Laterality: Bilateral  Location/Site:  L5-S1  Needle size: 22 guage  Needle type: Spinal  Needle Placement: Articular  Findings:  -Comments: Excellent flow of contrast producing a partial arthrogram.  Procedure Details: The fluoroscope beam is vertically oriented in AP, and the inferior recess is visualized beneath the lower pole of the inferior apophyseal process, which represents the target point for needle insertion. When direct visualization is difficult the target point is located at the medial projection of the vertebral pedicle. The region overlying each aforementioned target is locally anesthetized with a 1 to 2 ml. volume of 1% Lidocaine without Epinephrine.   The spinal needle was inserted into each of the above mentioned facet joints using biplanar fluoroscopic guidance. A 0.25 to 0.5 ml. volume of Isovue-250 was injected and a partial facet joint arthrogram was obtained. A single spot film was obtained of the resulting arthrogram.    One to 1.25 ml of the steroid/anesthetic solution was then injected into each of the facet joints noted above.   Additional Comments:  The patient tolerated the procedure well Dressing: 2 x 2 sterile gauze and Band-Aid    Post-procedure details: Patient was  observed during the procedure. Post-procedure instructions were reviewed.  Patient left the clinic in stable condition.

## 2019-11-12 NOTE — Progress Notes (Signed)
Amy [name] - 69 y.o. female MRN [id number]  Date of birth: 1951/02/20  Office Visit Note: Visit Date: 10/13/2019 PCP: System, Pcp Not In Referred by: No ref. provider found  Subjective: Chief Complaint  Patient presents with  . Lower Back - Pain  . Right Hip - Pain   HPI:  Amy [name] is a 69 y.o. female who comes in today For [name] is lumbar below this is typically below the feet 7 to 8 weeks now 2 months of pain in the lower back no specific injury nothing down the legs no weakness.  This is typically her pain pattern.  She uses lidocaine patches home exercise heat and ice etc.  She has not done well in the past with pain medications.  She also gets trochanteric bursitis on the right and we have intermittently injected that as well.  She still tries to stay active despite all her medical conditions.  We will complete these 2 injections today and follow her up as needed.  ROS Otherwise per HPI.  Assessment & Plan: Visit Diagnoses:  1. Spondylosis without myelopathy or radiculopathy, lumbar region     Plan: No additional findings.   Meds & Orders:  Meds ordered this encounter  Medications  . methylPREDNISolone acetate (DEPO-MEDROL) injection 40 mg    Orders Placed This Encounter  Procedures  . Facet Injection  . Large Joint Inj  . XR C-ARM NO REPORT    Follow-up: Return if symptoms worsen or fail to improve.   Procedures: Large Joint Inj: R greater trochanter on 11/12/2019 6:04 AM Indications: pain and diagnostic evaluation Details: 22 G 3.5 in needle, fluoroscopy-guided lateral approach  Arthrogram: No  Medications: 4 mL lidocaine 2 %; 4 mL bupivacaine 0.25 %; 60 mg triamcinolone acetonide 40 MG/ML Outcome: tolerated well, no immediate complications  There was excellent flow of contrast outlined the greater trochanteric bursa without vascular uptake. Procedure, treatment alternatives, risks and benefits explained, specific risks discussed. Consent was  given by the patient. Immediately prior to procedure a time out was called to verify the correct patient, procedure, equipment, support staff and site/side marked as required. Patient was prepped and draped in the usual sterile fashion.      Lumbar Facet Joint Intra-Articular Injection(s) with Fluoroscopic Guidance  Patient: Amy [name]      Date of Birth: 1951-04-17 MRN: [id number] PCP: System, Pcp Not In      Visit Date: 10/13/2019   Universal Protocol:    Date/Time: 10/13/2019  Consent Given By: the patient  Position: PRONE   Additional Comments: Vital signs were monitored before and after the procedure. Patient was prepped and draped in the usual sterile fashion. The correct patient, procedure, and site was verified.   Injection Procedure Details:  Procedure Site One Meds Administered:  Meds ordered this encounter  Medications  . methylPREDNISolone acetate (DEPO-MEDROL) injection 40 mg     Laterality: Bilateral  Location/Site:  L5-S1  Needle size: 22 guage  Needle type: Spinal  Needle Placement: Articular  Findings:  -Comments: Excellent flow of contrast producing a partial arthrogram.  Procedure Details: The fluoroscope beam is vertically oriented in AP, and the inferior recess is visualized beneath the lower pole of the inferior apophyseal process, which represents the target point for needle insertion. When direct visualization is difficult the target point is located at the medial projection of the vertebral pedicle. The region overlying each aforementioned target is locally anesthetized with a 1 to 2 ml. volume of 1%  Lidocaine without Epinephrine.   The spinal needle was inserted into each of the above mentioned facet joints using biplanar fluoroscopic guidance. A 0.25 to 0.5 ml. volume of Isovue-250 was injected and a partial facet joint arthrogram was obtained. A single spot film was obtained of the resulting arthrogram.    One to 1.25 ml of the  steroid/anesthetic solution was then injected into each of the facet joints noted above.   Additional Comments:  The patient tolerated the procedure well Dressing: 2 x 2 sterile gauze and Band-Aid    Post-procedure details: Patient was observed during the procedure. Post-procedure instructions were reviewed.  Patient left the clinic in stable condition.     Clinical History: CT Myelogram 2007L3-L4: Posterior fusion hardware intact. No  spinal or foraminal stenosis. Interspace unremarkable.   L4-L5: Bilateral laminar hooks at L4 intact. These result in some encroachment on the posterior aspect of thecal sac and contribute to mild   narrowing in the anteroposterior dimension. Lateral   and subarticular recesses remain patent. No significant disc bulge or protrusion. Foramina remain widely patent. Mild degenerative changes in the facet joints bilaterally without significant hypertrophy.   L5-S1: Moderately advanced bilateral facet degenerative hypertrophy. Interspace is unremarkable. Neural foramina widely patent.   Impression: 1. Posterior spinal fusion T11-L4 without apparent complication. 2. Bilateral facet degenerative hypertrophy, mild L4-L5, more advanced L5-S1.     Objective:  VS:  HT:    WT:   BMI:     BP:129/85  HR:75bpm  TEMP: ( )  RESP:  Physical Exam  Ortho Exam Imaging: No results found.

## 2020-02-28 ENCOUNTER — Other Ambulatory Visit: Payer: Self-pay | Admitting: Physician Assistant

## 2020-02-28 DIAGNOSIS — Z1231 Encounter for screening mammogram for malignant neoplasm of breast: Secondary | ICD-10-CM

## 2020-03-06 ENCOUNTER — Ambulatory Visit
Admission: RE | Admit: 2020-03-06 | Discharge: 2020-03-06 | Disposition: A | Discharge status: Home / Self Care | Payer: BC Managed Care – PPO | Source: Ambulatory Visit | Attending: Physician Assistant | Admitting: Physician Assistant

## 2020-03-06 ENCOUNTER — Other Ambulatory Visit: Payer: Self-pay

## 2020-03-06 DIAGNOSIS — Z1231 Encounter for screening mammogram for malignant neoplasm of breast: Secondary | ICD-10-CM

## 2021-02-27 ENCOUNTER — Other Ambulatory Visit: Payer: Self-pay | Admitting: Physician Assistant

## 2021-02-27 DIAGNOSIS — Z1231 Encounter for screening mammogram for malignant neoplasm of breast: Secondary | ICD-10-CM

## 2021-04-20 ENCOUNTER — Ambulatory Visit
Admission: RE | Admit: 2021-04-20 | Discharge: 2021-04-20 | Disposition: A | Discharge status: Home / Self Care | Payer: BC Managed Care – PPO | Source: Ambulatory Visit | Attending: Physician Assistant | Admitting: Physician Assistant

## 2021-04-20 ENCOUNTER — Other Ambulatory Visit: Payer: Self-pay

## 2021-04-20 DIAGNOSIS — Z1231 Encounter for screening mammogram for malignant neoplasm of breast: Secondary | ICD-10-CM

## 2021-05-21 ENCOUNTER — Telehealth: Payer: Self-pay | Admitting: Physical Medicine and Rehabilitation

## 2021-05-21 NOTE — Telephone Encounter (Signed)
Bilateral L5-S1 facet on 10/13/19. Ok to repeat if helped, same problem/side, and no new injury vs OV?  [place]

## 2021-05-21 NOTE — Telephone Encounter (Signed)
Patient called. She would like an appointment with Dr. Ernestina Patches. Her call back number is [phone]

## 2021-05-22 NOTE — Telephone Encounter (Signed)
Patient requested repeat right GT bursa injection. Scheduld for 10/18.

## 2021-05-29 ENCOUNTER — Ambulatory Visit: Payer: Self-pay

## 2021-05-29 ENCOUNTER — Other Ambulatory Visit: Payer: Self-pay

## 2021-05-29 ENCOUNTER — Ambulatory Visit (INDEPENDENT_AMBULATORY_CARE_PROVIDER_SITE_OTHER): Payer: BC Managed Care – PPO | Admitting: Physical Medicine and Rehabilitation

## 2021-05-29 DIAGNOSIS — M7061 Trochanteric bursitis, right hip: Secondary | ICD-10-CM

## 2021-05-29 NOTE — Progress Notes (Signed)
Right lateral thigh pain. Worse with sleeping and sometimes with sitting. Numeric Pain Rating Scale and Functional Assessment Average Pain 6   In the last MONTH (on 0-10 scale) has pain interfered with the following?  1. General activity like being  able to carry out your everyday physical activities such as walking, climbing stairs, carrying groceries, or moving a chair?  Rating(8)   -Dye Allergies.

## 2021-06-11 MED ORDER — LIDOCAINE HCL 2 % IJ SOLN
4.0000 mL | INTRAMUSCULAR | Status: AC | PRN
Start: 2021-05-29 — End: 2021-05-29
  Administered 2021-05-29: 4 mL

## 2021-06-11 MED ORDER — BUPIVACAINE HCL 0.25 % IJ SOLN
4.0000 mL | INTRAMUSCULAR | Status: AC | PRN
  Administered 2021-05-29: 4 mL via INTRA_ARTICULAR

## 2021-06-11 MED ORDER — TRIAMCINOLONE ACETONIDE 40 MG/ML IJ SUSP
60.0000 mg | INTRAMUSCULAR | Status: AC | PRN
  Administered 2021-05-29: 60 mg via INTRA_ARTICULAR

## 2021-06-11 NOTE — Progress Notes (Signed)
Amy [name] - 70 y.o. female MRN [id number]  Date of birth: 12-29-1950  Office Visit Note: Visit Date: 05/29/2021 PCP: [name], PA Referred by: [name], PA  Subjective: Chief Complaint  Patient presents with   Right Hip - Pain   HPI:  Amy [name] is a 70 y.o. female who comes in today for planned repeat Right greater trochanteric injection with fluoroscopic guidance.  The patient has failed conservative care including home exercise, medications, time and activity modification. Prior injection gave more than 50% relief for several months. This injection will be diagnostic and hopefully therapeutic.  Please see requesting physician notes for further details and justification.  The last time I saw the patient for a greater trochanter injection was in 2019 but I have seen her on several occasions throughout the years after that for diagnostic and therapeutic medial branch blocks at the L5-S1 level bilaterally.  She has had long stemmed fusion for scoliosis and burst fracture remotely.  She is having lateral hip pain with pain over the greater trochanter to palpation.  This is been a chronic issue for her.  She has no other complaints or red flag symptoms or trauma.  Referring: Dr. Basil Dess   ROS Otherwise per HPI.  Assessment & Plan: Visit Diagnoses:    ICD-10-CM   1. Greater trochanteric bursitis, right  M70.61 XR C-ARM NO REPORT      Plan: No additional findings.   Meds & Orders: No orders of the defined types were placed in this encounter.   Orders Placed This Encounter  Procedures   Large Joint Inj   XR C-ARM NO REPORT    Follow-up: Return if symptoms worsen or fail to improve.   Procedures: Large Joint Inj: R greater trochanter on 05/29/2021 9:45 AM Indications: pain and diagnostic evaluation Details: 22 G 3.5 in needle, fluoroscopy-guided lateral approach  Arthrogram: No  Medications: 4 mL lidocaine 2 %; 4 mL bupivacaine 0.25 %; 60 mg  triamcinolone acetonide 40 MG/ML Outcome: tolerated well, no immediate complications  There was excellent flow of contrast outlined the greater trochanteric bursa without vascular uptake. Procedure, treatment alternatives, risks and benefits explained, specific risks discussed. Consent was given by the patient. Immediately prior to procedure a time out was called to verify the correct patient, procedure, equipment, support staff and site/side marked as required. Patient was prepped and draped in the usual sterile fashion.         Clinical History: CT Myelogram 2007L3-L4: Posterior fusion hardware intact. No  spinal or foraminal stenosis. Interspace unremarkable.   L4-L5: Bilateral laminar hooks at L4 intact. These result in some encroachment on the posterior aspect of thecal sac and contribute to mild   narrowing in the anteroposterior dimension. Lateral   and subarticular recesses remain patent. No significant disc bulge or protrusion. Foramina remain widely patent. Mild degenerative changes in the facet joints bilaterally without significant hypertrophy.   L5-S1: Moderately advanced bilateral facet degenerative hypertrophy. Interspace is unremarkable. Neural foramina widely patent.   Impression: 1. Posterior spinal fusion T11-L4 without apparent complication. 2. Bilateral facet degenerative hypertrophy, mild L4-L5, more advanced L5-S1.     Objective:  VS:  HT:    WT:   BMI:     BP:   HR: bpm  TEMP: ( )  RESP:  Physical Exam Vitals and nursing note reviewed.  Constitutional:      General: She is not in acute distress.    Appearance: Normal appearance. She is not ill-appearing.  HENT:     Head: Normocephalic and atraumatic.     Right Ear: External ear normal.     Left Ear: External ear normal.  Eyes:     Extraocular Movements: Extraocular movements intact.  Cardiovascular:     Rate and Rhythm: Normal rate.     Pulses: Normal pulses.  Pulmonary:     Effort: Pulmonary  effort is normal. No respiratory distress.  Abdominal:     General: There is no distension.     Palpations: Abdomen is soft.  Musculoskeletal:        General: Tenderness present.     Cervical back: Neck supple.     Right lower leg: No edema.     Left lower leg: No edema.     Comments: Patient has good distal strength with concordant pain over the right greater trochanter.  No clonus or focal weakness.  Skin:    Findings: No erythema, lesion or rash.  Neurological:     General: No focal deficit present.     Mental Status: She is alert and oriented to person, place, and time.     Sensory: No sensory deficit.     Motor: No weakness or abnormal muscle tone.     Coordination: Coordination normal.  Psychiatric:        Mood and Affect: Mood normal.        Behavior: Behavior normal.     Imaging: No results found.

## 2021-06-12 ENCOUNTER — Telehealth: Payer: Self-pay | Admitting: Physical Medicine and Rehabilitation

## 2021-06-12 DIAGNOSIS — M47816 Spondylosis without myelopathy or radiculopathy, lumbar region: Secondary | ICD-10-CM

## 2021-06-12 NOTE — Telephone Encounter (Signed)
Pt calling to get sch'd with an appt with Dr. Ernestina Patches. The best call back number is [phone].

## 2021-06-13 ENCOUNTER — Telehealth: Payer: Self-pay | Admitting: Physical Medicine and Rehabilitation

## 2021-06-13 NOTE — Telephone Encounter (Signed)
Patient scheduled and referral placed.

## 2021-06-13 NOTE — Telephone Encounter (Signed)
Pt called and need to reschedule appt. Pt states she has another appt. Please call pt at [phone].

## 2021-06-14 ENCOUNTER — Telehealth: Payer: Self-pay | Admitting: Physical Medicine and Rehabilitation

## 2021-06-14 NOTE — Telephone Encounter (Signed)
Pt called stating she forgot she already had an appt on 06/21/21 and needs to r/s her appt with Dr.Newton  [phone]

## 2021-06-14 NOTE — Telephone Encounter (Signed)
See previous message

## 2021-06-14 NOTE — Telephone Encounter (Signed)
Left message #1 to reschedule. Can work in on Monday 11/14 in the morning.

## 2021-06-21 ENCOUNTER — Ambulatory Visit: Payer: BC Managed Care – PPO | Admitting: Physical Medicine and Rehabilitation

## 2021-06-25 ENCOUNTER — Ambulatory Visit: Payer: Self-pay

## 2021-06-25 ENCOUNTER — Encounter: Payer: Self-pay | Admitting: Physical Medicine and Rehabilitation

## 2021-06-25 ENCOUNTER — Ambulatory Visit (INDEPENDENT_AMBULATORY_CARE_PROVIDER_SITE_OTHER): Payer: BC Managed Care – PPO | Admitting: Physical Medicine and Rehabilitation

## 2021-06-25 ENCOUNTER — Other Ambulatory Visit: Payer: Self-pay

## 2021-06-25 VITALS — BP 157/87 | HR 86

## 2021-06-25 DIAGNOSIS — M545 Low back pain, unspecified: Secondary | ICD-10-CM

## 2021-06-25 DIAGNOSIS — M47816 Spondylosis without myelopathy or radiculopathy, lumbar region: Secondary | ICD-10-CM | POA: Diagnosis not present

## 2021-06-25 DIAGNOSIS — M961 Postlaminectomy syndrome, not elsewhere classified: Secondary | ICD-10-CM

## 2021-06-25 MED ORDER — METHYLPREDNISOLONE ACETATE 80 MG/ML IJ SUSP
80.0000 mg | Freq: Once | INTRAMUSCULAR | Status: AC
  Administered 2021-06-25: 11:00:00 80 mg

## 2021-06-25 NOTE — Patient Instructions (Signed)

## 2021-06-25 NOTE — Progress Notes (Signed)
Pt state lower back pain. Pt state sitting and bending makes the pain worse. Pt state she takes pain meds to help ease her pain.  Numeric Pain Rating Scale and Functional Assessment Average Pain 7   In the last MONTH (on 0-10 scale) has pain interfered with the following?  1. General activity like being  able to carry out your everyday physical activities such as walking, climbing stairs, carrying groceries, or moving a chair?  Rating(9)   +Driver, -BT, -Dye Allergies.

## 2021-07-08 NOTE — Progress Notes (Signed)
KADENCE MIKKELSON - 70 y.o. female MRN [id number]  Date of birth: [birth year]  Office Visit Note: Visit Date: 06/25/2021 PCP: [name], PA Referred by: [name], PA  Subjective: No chief complaint on file.  HPI:  KALEYAH LABRECK is a 70 y.o. female who comes in today for planned repeat Bilateral L5-S1 Lumbar facet/medial branch block with fluoroscopic guidance.  The patient has failed conservative care including home exercise, medications, time and activity modification.  This injection will be diagnostic and hopefully therapeutic.  Please see requesting physician notes for further details and justification.  Exam shows concordant low back pain with facet joint loading and extension. Patient received more than 80% pain relief from prior injection.      Referring:Dr. Basil Dess   ROS Otherwise per HPI.  Assessment & Plan: Visit Diagnoses:    ICD-10-CM   1. Spondylosis without myelopathy or radiculopathy, lumbar region  M47.816 XR C-ARM NO REPORT    Facet Injection    methylPREDNISolone acetate (DEPO-MEDROL) injection 80 mg    2. Chronic bilateral low back pain without sciatica  M54.50    G89.29     3. Post laminectomy syndrome  M96.1       Plan: No additional findings.   Meds & Orders:  Meds ordered this encounter  Medications   methylPREDNISolone acetate (DEPO-MEDROL) injection 80 mg    Orders Placed This Encounter  Procedures   Facet Injection   XR C-ARM NO REPORT    Follow-up: Return if symptoms worsen or fail to improve.   Procedures: No procedures performed  Lumbar Diagnostic Facet Joint Nerve Block with Fluoroscopic Guidance   Patient: DESHONDA CRYDERMAN      Date of Birth: [birth year] MRN: [id number] PCP: [name], PA      Visit Date: 06/25/2021   Universal Protocol:    Date/Time: 11/27/225:28 PM  Consent Given By: the patient  Position: PRONE  Additional Comments: Vital signs were monitored before and after the  procedure. Patient was prepped and draped in the usual sterile fashion. The correct patient, procedure, and site was verified.   Injection Procedure Details:   Procedure diagnoses:  1. Spondylosis without myelopathy or radiculopathy, lumbar region   2. Chronic bilateral low back pain without sciatica   3. Post laminectomy syndrome      Meds Administered:  Meds ordered this encounter  Medications   methylPREDNISolone acetate (DEPO-MEDROL) injection 80 mg     Laterality: Bilateral  Location/Site: L5-S1, L4 medial branch and L5 dorsal ramus  Needle: 5.0 in., 25 ga.  Short bevel or Quincke spinal needle  Needle Placement: Oblique pedical  Findings:   -Comments: There was excellent flow of contrast along the articular pillars without intravascular flow.  Procedure Details: The fluoroscope beam is vertically oriented in AP and then obliqued 15 to 20 degrees to the ipsilateral side of the desired nerve to achieve the "Scotty dog" appearance.  The skin over the target area of the junction of the superior articulating process and the transverse process (sacral ala if blocking the L5 dorsal rami) was locally anesthetized with a 1 ml volume of 1% Lidocaine without Epinephrine.  The spinal needle was inserted and advanced in a trajectory view down to the target.   After contact with periosteum and negative aspirate for blood and CSF, correct placement without intravascular or epidural spread was confirmed by injecting 0.5 ml. of Isovue-250.  A spot radiograph was obtained of this image.    Next, a 0.5 ml.  volume of the injectate described above was injected. The needle was then redirected to the other facet joint nerves mentioned above if needed.  Prior to the procedure, the patient was given a Pain Diary which was completed for baseline measurements.  After the procedure, the patient rated their pain every 30 minutes and will continue rating at this frequency for a total of 5 hours.  The  patient has been asked to complete the Diary and return to [place] by mail, fax or hand delivered as soon as possible.   Additional Comments:  The patient tolerated the procedure well Dressing: 2 x 2 sterile gauze and Band-Aid    Post-procedure details: Patient was observed during the procedure. Post-procedure instructions were reviewed.  Patient left the clinic in stable condition.   Clinical History: CT Myelogram 2007L3-L4: Posterior fusion hardware intact. No  spinal or foraminal stenosis. Interspace unremarkable.   L4-L5: Bilateral laminar hooks at L4 intact. These result in some encroachment on the posterior aspect of thecal sac and contribute to mild   narrowing in the anteroposterior dimension. Lateral   and subarticular recesses remain patent. No significant disc bulge or protrusion. Foramina remain widely patent. Mild degenerative changes in the facet joints bilaterally without significant hypertrophy.   L5-S1: Moderately advanced bilateral facet degenerative hypertrophy. Interspace is unremarkable. Neural foramina widely patent.   Impression: 1. Posterior spinal fusion T11-L4 without apparent complication. 2. Bilateral facet degenerative hypertrophy, mild L4-L5, more advanced L5-S1.     Objective:  VS:  HT:    WT:   BMI:     BP:(!) 157/87  HR:86bpm  TEMP: ( )  RESP:  Physical Exam Vitals and nursing note reviewed.  Constitutional:      General: She is not in acute distress.    Appearance: Normal appearance. She is not ill-appearing.  HENT:     Head: Normocephalic and atraumatic.     Right Ear: External ear normal.     Left Ear: External ear normal.  Eyes:     Extraocular Movements: Extraocular movements intact.  Cardiovascular:     Rate and Rhythm: Normal rate.     Pulses: Normal pulses.  Pulmonary:     Effort: Pulmonary effort is normal. No respiratory distress.  Abdominal:     General: There is no distension.     Palpations: Abdomen is soft.   Musculoskeletal:        General: Tenderness present.     Cervical back: Neck supple.     Right lower leg: No edema.     Left lower leg: No edema.     Comments: Patient has good distal strength with no pain over the greater trochanters.  No clonus or focal weakness. Patient somewhat slow to rise from a seated position to full extension.  There is concordant low back pain with facet loading and lumbar spine extension rotation.  There are no definitive trigger points but the patient is somewhat tender across the lower back and PSIS.  There is no pain with hip rotation.   Skin:    Findings: No erythema, lesion or rash.  Neurological:     General: No focal deficit present.     Mental Status: She is alert and oriented to person, place, and time.     Sensory: No sensory deficit.     Motor: No weakness or abnormal muscle tone.     Coordination: Coordination normal.  Psychiatric:        Mood and Affect: Mood normal.  Behavior: Behavior normal.     Imaging: No results found.

## 2021-07-08 NOTE — Procedures (Signed)
Lumbar Diagnostic Facet Joint Nerve Block with Fluoroscopic Guidance   Patient: Amy [name]      Date of Birth: 03-Jun-1951 MRN: [id number] PCP: [name], PA      Visit Date: 06/25/2021   Universal Protocol:    Date/Time: 11/27/225:28 PM  Consent Given By: the patient  Position: PRONE  Additional Comments: Vital signs were monitored before and after the procedure. Patient was prepped and draped in the usual sterile fashion. The correct patient, procedure, and site was verified.   Injection Procedure Details:   Procedure diagnoses:  1. Spondylosis without myelopathy or radiculopathy, lumbar region   2. Chronic bilateral low back pain without sciatica   3. Post laminectomy syndrome      Meds Administered:  Meds ordered this encounter  Medications   methylPREDNISolone acetate (DEPO-MEDROL) injection 80 mg     Laterality: Bilateral  Location/Site: L5-S1, L4 medial branch and L5 dorsal ramus  Needle: 5.0 in., 25 ga.  Short bevel or Quincke spinal needle  Needle Placement: Oblique pedical  Findings:   -Comments: There was excellent flow of contrast along the articular pillars without intravascular flow.  Procedure Details: The fluoroscope beam is vertically oriented in AP and then obliqued 15 to 20 degrees to the ipsilateral side of the desired nerve to achieve the "Scotty dog" appearance.  The skin over the target area of the junction of the superior articulating process and the transverse process (sacral ala if blocking the L5 dorsal rami) was locally anesthetized with a 1 ml volume of 1% Lidocaine without Epinephrine.  The spinal needle was inserted and advanced in a trajectory view down to the target.   After contact with periosteum and negative aspirate for blood and CSF, correct placement without intravascular or epidural spread was confirmed by injecting 0.5 ml. of Isovue-250.  A spot radiograph was obtained of this image.    Next, a 0.5 ml. volume of  the injectate described above was injected. The needle was then redirected to the other facet joint nerves mentioned above if needed.  Prior to the procedure, the patient was given a Pain Diary which was completed for baseline measurements.  After the procedure, the patient rated their pain every 30 minutes and will continue rating at this frequency for a total of 5 hours.  The patient has been asked to complete the Diary and return to [place] by mail, fax or hand delivered as soon as possible.   Additional Comments:  The patient tolerated the procedure well Dressing: 2 x 2 sterile gauze and Band-Aid    Post-procedure details: Patient was observed during the procedure. Post-procedure instructions were reviewed.  Patient left the clinic in stable condition.

## 2022-04-09 ENCOUNTER — Other Ambulatory Visit: Payer: Self-pay | Admitting: Physician Assistant

## 2022-04-09 DIAGNOSIS — Z1231 Encounter for screening mammogram for malignant neoplasm of breast: Secondary | ICD-10-CM

## 2022-04-25 ENCOUNTER — Telehealth: Payer: Self-pay | Admitting: Hematology and Oncology

## 2022-04-25 NOTE — Telephone Encounter (Signed)
Scheduled appt per 9/14 referral. Pt is aware of appt date and time. Pt is aware to arrive 15 mins prior to appt time and to bring and updated insurance card. Pt is aware of appt location.   

## 2022-05-01 ENCOUNTER — Ambulatory Visit
Admission: RE | Admit: 2022-05-01 | Discharge: 2022-05-01 | Disposition: A | Discharge status: Home / Self Care | Payer: BC Managed Care – PPO | Source: Ambulatory Visit | Attending: Physician Assistant | Admitting: Physician Assistant

## 2022-05-01 DIAGNOSIS — Z1231 Encounter for screening mammogram for malignant neoplasm of breast: Secondary | ICD-10-CM

## 2022-05-01 LAB — COLOGUARD: COLOGUARD: NEGATIVE

## 2022-06-10 ENCOUNTER — Encounter: Payer: BC Managed Care – PPO | Admitting: Hematology and Oncology

## 2022-10-10 ENCOUNTER — Telehealth: Payer: Self-pay | Admitting: Hematology and Oncology

## 2022-10-10 NOTE — Telephone Encounter (Signed)
A new referral was sent over from [place]. Spoke to pt and r/s her cancelled appt from October. She is aware of new appt date/time.

## 2022-10-11 ENCOUNTER — Encounter: Payer: Self-pay | Admitting: Hematology and Oncology

## 2022-10-23 ENCOUNTER — Inpatient Hospital Stay: Payer: BC Managed Care – PPO | Attending: Hematology and Oncology | Admitting: Hematology and Oncology

## 2022-10-23 VITALS — BP 151/94 | HR 82 | Temp 97.3°F | Resp 16 | Ht 66.0 in | Wt 159.8 lb

## 2022-10-23 DIAGNOSIS — Z170 Estrogen receptor positive status [ER+]: Secondary | ICD-10-CM | POA: Diagnosis not present

## 2022-10-23 DIAGNOSIS — Z853 Personal history of malignant neoplasm of breast: Secondary | ICD-10-CM | POA: Insufficient documentation

## 2022-10-23 DIAGNOSIS — Z9012 Acquired absence of left breast and nipple: Secondary | ICD-10-CM | POA: Insufficient documentation

## 2022-10-23 DIAGNOSIS — C50412 Malignant neoplasm of upper-outer quadrant of left female breast: Secondary | ICD-10-CM | POA: Diagnosis not present

## 2022-10-23 DIAGNOSIS — Z87891 Personal history of nicotine dependence: Secondary | ICD-10-CM | POA: Diagnosis not present

## 2022-10-23 DIAGNOSIS — D509 Iron deficiency anemia, unspecified: Secondary | ICD-10-CM | POA: Diagnosis present

## 2022-10-23 DIAGNOSIS — Z90710 Acquired absence of both cervix and uterus: Secondary | ICD-10-CM | POA: Insufficient documentation

## 2022-10-23 MED ORDER — FERROUS GLUCONATE 324 (38 FE) MG PO TABS: 324.0000 mg | ORAL_TABLET | Freq: Two times a day (BID) | ORAL | 6 refills | Status: DC

## 2022-10-23 NOTE — Assessment & Plan Note (Signed)
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 Breast exam 10/23/2022: Normal 2. Right Mammogram 05/02/22 No abnormalities. Postsurgical changes. Breast Density Category C.   Patient stays very busy with dancing, helping her mother and other friends.   RTC in 1 year

## 2022-10-23 NOTE — Progress Notes (Signed)
[facility] NOTE  Patient Care Team: Amy [name], [place] as PCP - General (Family Medicine) Amy Reese, MD as Consulting Physician (Plastic Surgery) Amy Sar, MD as Referring Physician (Otolaryngology) Amy Minion, MD as Consulting Physician (Orthopedic Surgery)  CHIEF COMPLAINTS/PURPOSE OF CONSULTATION:  Iron deficiency anemia  HISTORY OF PRESENTING ILLNESS:  Amy [name] 72 y.o. female is here because of iron deficiency anemia.  Patient has been noted to be iron deficient over the past several years previously it was felt to be related to surgical procedures and blood loss with her knee replacement surgeries but she had been iron deficient even prior to that.  She tells me that she had endoscopies and colonoscopies and there was no evidence of any bleeding.  Based on previous adverse effects to IV iron she does not want to take any form of IV iron.  I reviewed her records extensively and collaborated the history with the patient.   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    chemotherapy x 5 months   Anxiety    "sometimes" (09/23/2014)   Cancer of left breast ([facility]) 08/2014   left upper outer   Cerebral meningioma ([facility]) 09/2012   auditory; s/p excision   Chronic lower back pain    Chronic pain in left foot    Deafness in left ear    Facial droop    "left side; from brain tumor OR"   GERD (gastroesophageal reflux disease)    History of chemotherapy    last treatment 03/02/2015   History of hiatal hernia    Hypertension    Migraine    "before brain tumor was removed" (09/23/2014)   Personal history of chemotherapy 09/12/2014   PONV (postoperative nausea and vomiting)    "severe"   Stress incontinence     SURGICAL HISTORY: Past Surgical History:  Procedure Laterality Date   AUGMENTATION MAMMAPLASTY Bilateral 11/11/2015   BACK SURGERY     BRAIN SURGERY  2014    cerebellopontine angle tumor with craniotomy    BREAST BIOPSY Left 08/2014   BREAST RECONSTRUCTION Left 05/18/2015   BREAST RECONSTRUCTION WITH PLACEMENT OF TISSUE EXPANDER AND FLEX HD (ACELLULAR HYDRATED DERMIS) Left 05/18/2015   Procedure: LEFT BREAST RECONSTRUCTION WITH PLACEMENT OF TISSUE EXPANDER AND ACELLULAR HYDRATED DERMIS MATRIX;  Surgeon: Amy Reese, MD;  Location: [facility];  Service: Plastics;  Laterality: Left;   COLONOSCOPY     FOOT FRACTURE SURGERY Left 1990's   FOOT SURGERY Left    "one the muscle; trying to roll foot inward after failed recovery S/P fracture"   FRACTURE SURGERY     JOINT REPLACEMENT     KNEE ARTHROSCOPY Left    MASTECTOMY COMPLETE / SIMPLE W/ SENTINEL NODE BIOPSY Left 09/23/2014   MULTIPLE TOOTH EXTRACTIONS     PARTIAL KNEE ARTHROPLASTY Left 2000's    Dr. Sharol Given   PATELLAR TENDON REPAIR  05/27/2012   Procedure: PATELLA TENDON REPAIR;  Surgeon: Amy Minion, MD;  Location: [facility];  Service: Orthopedics;  Laterality: Right;  Right Patella Tendon  Reconstruction with Semi Tendonosum   PORT-A-CATH REMOVAL Right 03/16/2015   Procedure: REMOVAL PORT-A-CATH;  Surgeon: Fanny Skates, MD;  Location: [facility];  Service: General;  Laterality: Right;   PORTACATH PLACEMENT Right 09/23/2014   PORTACATH PLACEMENT Right 09/23/2014   Procedure: INSERTION PORT-A-CATH RIGHT SUBCLAVIAN;  Surgeon: Fanny Skates, MD;  Location: [facility];  Service: General;  Laterality: Right;   POSTERIOR LUMBAR FUSION  02/1988   L2 reconstruction; fusion   SIMPLE MASTECTOMY WITH AXILLARY SENTINEL NODE BIOPSY Left 09/23/2014   Procedure: LEFT TOTAL MASTECTOMY WITH LEFT AXILLARY SENTINEL LYMPH NODE BIOPSY;  Surgeon: Fanny Skates, MD;  Location: [facility];  Service: General;  Laterality: Left;   TOTAL ABDOMINAL HYSTERECTOMY  12/1976   TOTAL KNEE ARTHROPLASTY Right 05/24/2009    Dr. Sharol Given   UPPER GASTROINTESTINAL ENDOSCOPY     WISDOM TOOTH EXTRACTION      SOCIAL HISTORY: Social History   Socioeconomic History    Marital status: Married    Spouse name: Herbie [place]   Number of children: 0   Years of education: Not on file   Highest education level: Some college, no degree  Occupational History   Occupation: disability    Comment: HVAC  Tobacco Use   Smoking status: Former    Packs/day: 0.25    Years: 20.00    Total pack years: 5.00    Types: Cigarettes    Quit date: 05/22/1995    Years since quitting: 27.4   Smokeless tobacco: Never  Vaping Use   Vaping Use: Never used  Substance and Sexual Activity   Alcohol use: No   Drug use: No   Sexual activity: Yes    Partners: Male    Birth control/protection: None    Comment: menarche age 68, P 0 ,HRT x 42 yrs  Other Topics Concern   Not on file  Social History Narrative   Lives with her husband, 5 cats, 2 dogs and a variety of other pets. Raised a child, Eddie Dibbles, who is now grown.   Social Determinants of Health   Financial Resource Strain: Low Risk  (06/27/2017)   Overall Financial Resource Strain (CARDIA)    Difficulty of Paying Living Expenses: Not hard at all  Food Insecurity: No Food Insecurity (06/27/2017)   Hunger Vital Sign    Worried About Running Out of Food in the Last Year: Never true    Ran Out of Food in the Last Year: Never true  Transportation Needs: No Transportation Needs (06/27/2017)   PRAPARE - Hydrologist (Medical): No    Lack of Transportation (Non-Medical): No  Physical Activity: Inactive (06/27/2017)   Exercise Vital Sign    Days of Exercise per Week: 0 days    Minutes of Exercise per Session: 0 min  Stress: No Stress Concern Present (06/27/2017)   [place]    Feeling of Stress : Not at all  Social Connections: Somewhat Isolated (06/27/2017)   Social Connection and Isolation Panel [NHANES]    Frequency of Communication with Friends and Family: More than three times a week    Frequency of Social Gatherings with  Friends and Family: More than three times a week    Attends Religious Services: Never    Marine scientist or Organizations: No    Attends Archivist Meetings: Never    Marital Status: Married  Human resources officer Violence: Not At Risk (06/27/2017)   Humiliation, Afraid, Rape, and Kick questionnaire    Fear of Current or Ex-Partner: No    Emotionally Abused: No    Physically Abused: No    Sexually Abused: No    FAMILY HISTORY: Family History  Adopted: Yes  Problem Relation Age of Onset   Hypertension Father     ALLERGIES:  is allergic to codeine, darvocet [propoxyphene n-acetaminophen], tramadol, gold-containing  drug products, meperidine, morphine, verapamil, and sulfa antibiotics.  MEDICATIONS:  Current Outpatient Medications  Medication Sig Dispense Refill   ferrous gluconate (FERGON) 324 MG tablet Take 1 tablet (324 mg total) by mouth 2 (two) times daily with a meal. 60 tablet 6   ALPRAZolam (XANAX) 0.25 MG tablet Take 1 tablet (0.25 mg total) by mouth 2 (two) times daily as needed. 60 tablet 0   citalopram (CELEXA) 40 MG tablet Take 1 tablet (40 mg total) by mouth daily. 90 tablet 3   HYDROcodone-acetaminophen (NORCO/VICODIN) 5-325 MG tablet      ipratropium (ATROVENT) 0.03 % nasal spray Place 2 sprays into both nostrils 2 (two) times daily. 30 mL 3   lansoprazole (PREVACID) 15 MG capsule Take 30 mg by mouth daily at 12 noon. Reported on 01/02/2016     lisinopril (PRINIVIL,ZESTRIL) 20 MG tablet Take 1 tablet (20 mg total) by mouth daily. 90 tablet 3   metoprolol succinate (TOPROL-XL) 100 MG 24 hr tablet TAKE 1 TABLET BY MOUTH DAILY. TAKE WITH OR IMMEDIATELY FOLLOWING A MEAL. 90 tablet 0   PARoxetine (PAXIL) 10 MG tablet Take by mouth.     zolpidem (AMBIEN) 10 MG tablet Take 1 tablet (10 mg total) by mouth at bedtime. 30 tablet 0   No current facility-administered medications for this visit.    REVIEW OF SYSTEMS:   Constitutional: Denies fevers, chills or  abnormal night sweats All other systems were reviewed with the patient and are negative.  PHYSICAL EXAMINATION: ECOG PERFORMANCE STATUS: 1 - Symptomatic but completely ambulatory  Vitals:   10/23/22 1225  BP: (!) 151/94  Pulse: 82  Resp: 16  Temp: (!) 97.3 F (36.3 C)  SpO2: 99%   Filed Weights   10/23/22 1225  Weight: 159 lb 12.8 oz (72.5 kg)    GENERAL:alert, no distress and comfortable  LABORATORY DATA:  I have reviewed the data as listed Lab Results  Component Value Date   WBC 6.5 12/26/2017   HGB 13.0 12/26/2017   HCT 39.1 12/26/2017   MCV 85 12/26/2017   PLT 445 (H) 12/26/2017   Lab Results  Component Value Date   NA 138 12/26/2017   K 4.3 12/26/2017   CL 102 12/26/2017   CO2 20 12/26/2017    RADIOGRAPHIC STUDIES: I have personally reviewed the radiological reports and agreed with the findings in the report.  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 Breast exam 10/23/2022: Normal 2. Right Mammogram 05/02/22 No abnormalities. Postsurgical changes. Breast Density Category C.   Patient stays very busy with dancing, helping her mother and other friends.   RTC in 1 year  Iron deficiency anemia Patient had bilateral knee replacement surgeries over the past 2 years and she was identified to be iron deficient and she received IV iron with profound adverse effects of nausea vomiting and diarrhea.  Therefore patient does not want to receive any further  iron infusions. Upper endoscopies and colonoscopies were done previously: Negative  Lab review: 10/09/2022: Hemoglobin 9.6, MCV 72, platelets  592, iron saturation 6%, ferritin 14  Suspect malabsorption as a potential etiology.  Recommendation: Because of previous intolerance to IV iron therapy we do not want to give her additional IV iron.  I sent a prescription for ferrous gluconate twice a day.  Recheck labs in 3 months and telephone visit 2 days later to discuss results.  All questions were answered. The patient knows to call the clinic with any problems, questions or concerns.    Harriette Ohara, MD 10/23/22

## 2022-10-23 NOTE — Assessment & Plan Note (Signed)
Patient had bilateral knee replacement surgeries over the past 2 years and she was identified to be iron deficient and she received IV iron with profound adverse effects of nausea vomiting and diarrhea.  Therefore patient does not want to receive any further iron infusions. Upper endoscopies and colonoscopies were done previously: Negative  Lab review: 10/09/2022: Hemoglobin 9.6, MCV 72, platelets 592, iron saturation 6%, ferritin 14  Suspect malabsorption as a potential etiology.  Recommendation: Because of previous intolerance to IV iron therapy we do not want to give her additional IV iron.  I sent a prescription for ferrous gluconate twice a day.  Recheck labs in 3 months and telephone visit 2 days later to discuss results.

## 2022-10-31 ENCOUNTER — Encounter: Payer: BC Managed Care – PPO | Admitting: Hematology and Oncology

## 2022-11-11 ENCOUNTER — Other Ambulatory Visit: Payer: Self-pay

## 2022-11-11 ENCOUNTER — Encounter: Payer: Self-pay | Admitting: Hematology and Oncology

## 2022-11-11 MED ORDER — FERROUS FUMARATE 324 (106 FE) MG PO TABS: 1.0000 | ORAL_TABLET | Freq: Every day | ORAL | 0 refills | Status: DC

## 2022-11-11 NOTE — Progress Notes (Signed)
Called Pt regarding MyChart message. Pt states upon taking ferrous gluconate, her mouth began to tingle but resolved after benadryl use. Per MD request, ferrous fumarate ordered for Pt to try. Advised Pt to try new rx once a day, but to take benadryl as needed and stop rx if same sx occur. Also advised Pt that if she is able to tolerate new rx, she can call to let us know and we will increase dosage to BID. Pt verbalized understanding. Ferrous gluconate D/Cd and ferrous fumarate order placed.

## 2022-12-03 ENCOUNTER — Other Ambulatory Visit: Payer: Self-pay | Admitting: Hematology and Oncology

## 2023-01-07 ENCOUNTER — Telehealth: Payer: Self-pay | Admitting: Hematology and Oncology

## 2023-01-07 NOTE — Telephone Encounter (Signed)
Rescheduled appointment per provider PAL. Left voicemail. 

## 2023-01-15 ENCOUNTER — Other Ambulatory Visit: Payer: Self-pay

## 2023-01-15 DIAGNOSIS — D509 Iron deficiency anemia, unspecified: Secondary | ICD-10-CM

## 2023-01-15 DIAGNOSIS — C50412 Malignant neoplasm of upper-outer quadrant of left female breast: Secondary | ICD-10-CM

## 2023-01-16 ENCOUNTER — Inpatient Hospital Stay: Payer: BC Managed Care – PPO | Attending: Hematology and Oncology

## 2023-01-16 DIAGNOSIS — Z8719 Personal history of other diseases of the digestive system: Secondary | ICD-10-CM | POA: Insufficient documentation

## 2023-01-16 DIAGNOSIS — D509 Iron deficiency anemia, unspecified: Secondary | ICD-10-CM | POA: Diagnosis present

## 2023-01-16 DIAGNOSIS — Z87891 Personal history of nicotine dependence: Secondary | ICD-10-CM | POA: Insufficient documentation

## 2023-01-16 DIAGNOSIS — I10 Essential (primary) hypertension: Secondary | ICD-10-CM | POA: Diagnosis not present

## 2023-01-16 DIAGNOSIS — C50412 Malignant neoplasm of upper-outer quadrant of left female breast: Secondary | ICD-10-CM | POA: Diagnosis not present

## 2023-01-16 DIAGNOSIS — G4700 Insomnia, unspecified: Secondary | ICD-10-CM | POA: Insufficient documentation

## 2023-01-16 DIAGNOSIS — K219 Gastro-esophageal reflux disease without esophagitis: Secondary | ICD-10-CM | POA: Diagnosis not present

## 2023-01-16 DIAGNOSIS — Z170 Estrogen receptor positive status [ER+]: Secondary | ICD-10-CM

## 2023-01-16 DIAGNOSIS — G894 Chronic pain syndrome: Secondary | ICD-10-CM | POA: Diagnosis not present

## 2023-01-16 DIAGNOSIS — E785 Hyperlipidemia, unspecified: Secondary | ICD-10-CM | POA: Insufficient documentation

## 2023-01-16 LAB — FERRITIN: Ferritin: 11 ng/mL (ref 11–307)

## 2023-01-16 LAB — CBC WITH DIFFERENTIAL (CANCER CENTER ONLY)
Abs Immature Granulocytes: 0.03 10*3/uL (ref 0.00–0.07)
Basophils Absolute: 0.1 10*3/uL (ref 0.0–0.1)
Basophils Relative: 1 %
Eosinophils Absolute: 0.2 10*3/uL (ref 0.0–0.5)
Eosinophils Relative: 4 %
HCT: 35.2 % — ABNORMAL LOW (ref 36.0–46.0)
Hemoglobin: 10.8 g/dL — ABNORMAL LOW (ref 12.0–15.0)
Immature Granulocytes: 1 %
Lymphocytes Relative: 34 %
Lymphs Abs: 2.2 10*3/uL (ref 0.7–4.0)
MCH: 23.7 pg — ABNORMAL LOW (ref 26.0–34.0)
MCHC: 30.7 g/dL (ref 30.0–36.0)
MCV: 77.2 fL — ABNORMAL LOW (ref 80.0–100.0)
Monocytes Absolute: 0.6 10*3/uL (ref 0.1–1.0)
Monocytes Relative: 10 %
Neutro Abs: 3.4 10*3/uL (ref 1.7–7.7)
Neutrophils Relative %: 50 %
Platelet Count: 559 10*3/uL — ABNORMAL HIGH (ref 150–400)
RBC: 4.56 MIL/uL (ref 3.87–5.11)
RDW: 18.5 % — ABNORMAL HIGH (ref 11.5–15.5)
WBC Count: 6.7 10*3/uL (ref 4.0–10.5)
nRBC: 0 % (ref 0.0–0.2)

## 2023-01-16 LAB — CMP (CANCER CENTER ONLY)
ALT: 8 U/L (ref 0–44)
AST: 13 U/L — ABNORMAL LOW (ref 15–41)
Albumin: 4 g/dL (ref 3.5–5.0)
Alkaline Phosphatase: 43 U/L (ref 38–126)
Anion gap: 6 (ref 5–15)
BUN: 7 mg/dL — ABNORMAL LOW (ref 8–23)
CO2: 27 mmol/L (ref 22–32)
Calcium: 8.9 mg/dL (ref 8.9–10.3)
Chloride: 102 mmol/L (ref 98–111)
Creatinine: 0.65 mg/dL (ref 0.44–1.00)
GFR, Estimated: >60 mL/min (ref >60)
Glucose, Bld: 123 mg/dL — ABNORMAL HIGH (ref 70–99)
Potassium: 3.8 mmol/L (ref 3.5–5.1)
Sodium: 135 mmol/L (ref 135–145)
Total Bilirubin: 0.3 mg/dL (ref 0.3–1.2)
Total Protein: 6.6 g/dL (ref 6.5–8.1)

## 2023-01-16 LAB — IRON AND IRON BINDING CAPACITY (CC-WL,HP ONLY)
Iron: 56 ug/dL (ref 28–170)
Saturation Ratios: 11 % (ref 10.4–31.8)
TIBC: 521 ug/dL — ABNORMAL HIGH (ref 250–450)
UIBC: 465 ug/dL — ABNORMAL HIGH (ref 148–442)

## 2023-01-20 ENCOUNTER — Inpatient Hospital Stay (HOSPITAL_BASED_OUTPATIENT_CLINIC_OR_DEPARTMENT_OTHER): Payer: BC Managed Care – PPO | Admitting: Adult Health

## 2023-01-20 ENCOUNTER — Encounter: Payer: Self-pay | Admitting: Adult Health

## 2023-01-20 ENCOUNTER — Inpatient Hospital Stay: Payer: BC Managed Care – PPO | Admitting: Hematology and Oncology

## 2023-01-20 DIAGNOSIS — D509 Iron deficiency anemia, unspecified: Secondary | ICD-10-CM

## 2023-01-20 NOTE — Assessment & Plan Note (Signed)
Amy [name] is a 72 year old woman with iron deficiency anemia likely related to malabsorption following up today after recent lab testing.  She has increased iron rich foods that she has been unable to tolerate oral iron or IV iron.  Her iron levels are slowly responding to this.    [name] noted that she would prefer her PCP follow her iron deficiency anemia and that she will see [place] on an as-needed basis.  This is perfectly reasonable and we are happy to accommodate this.  I recommended continued iron rich foods.  RTC PRN.

## 2023-01-20 NOTE — Progress Notes (Signed)
[facility] Cancer Follow up:    Porfirio Oar, PA [address] [place] [address]   DIAGNOSIS: Iron deficiency anemia, history of left breast cancer  I connected with [name] on 01/20/23 at 11:15 AM EDT by telephone and verified that I am speaking with the correct person using two identifiers.  I discussed the limitations, risks, security and privacy concerns of performing an evaluation and management service by telephone and the availability of in person appointments.  I also discussed with the patient that there may be a patient responsible charge related to this service. The patient expressed understanding and agreed to proceed.  Patient location: home Provider location: [facility] office  SUMMARY OF ONCOLOGIC HISTORY: Oncology History  Breast cancer of upper-outer quadrant of left female breast (HCC)  08/23/2014 Mammogram   Left breast: possible mass warranting further imaging   08/30/2014 Breast US   Left breast: solid heterogeneous mass centered at 3:00, 2 cm from the nipple and extending into the retroareolar region. Mass is greater than 5 cm.   09/06/2014 Initial Biopsy   Left breast 3:00 biopsy: Invasive ductal carcinoma with abundant extracellular mucin, ER+ (97%), PR+ (98%), Ki-67 47%, HER-2 negative (ratio 1.28)   09/12/2014 Breast MRI   Left breast: 6.5 x 5.3 x 5.1 cm irregular enhancing mass with 2 adjacent 8 mm satellite masses, no lymphadenopathy   09/14/2014 Clinical Stage   Stage IIB: T3 M0   09/23/2014 Definitive Surgery   Left Mastectomy/[facility] Derrell Lolling): IDC grade 2 with extracellular mucin 6.2 cm, 4 LN Neg, ER 97%, PR 98%, Her 2 Neg ratio 1.28, Ki 67 47%   09/23/2014 Pathologic Stage   Stage IIB: T3 N0   10/20/2014 - 03/02/2015 Chemotherapy   Adjuvant chemotherapy with dose dense Adriamycin and Cytoxan 4 followed by Abraxane weekly 12, refused radiation    Radiation Therapy   Pt declined   03/27/2015 - 01/24/2016  Anti-estrogen oral therapy   Anastrozole 1 mg daily switched to letrozole Nov 2016, Stopped due to myalgias   05/18/2015 Surgery   Left delayed breast reconstruction with tissue expander. Capsulectomy and ablation of chronic seroma cavity Odis Luster)   07/12/2015 Survivorship   Survivorship care plan completed and mailed to patient in lieu of in person visit   HEMATOLOGIC HISTORY:  Copied from Dr. Magnus Ivan office visit 10/09/2022 Patient had bilateral knee replacement surgeries over the past 2 years and she was identified to be iron deficient and she received IV iron with profound adverse effects of nausea vomiting and diarrhea.  Therefore patient does not want to receive any further iron infusions. Upper endoscopies and colonoscopies were done previously: Negative  Lab review: 10/09/2022: Hemoglobin 9.6, MCV 72, platelets 592, iron saturation 6%, ferritin 14   CURRENT THERAPY: Iron rich foods, unable to take oral iron  INTERVAL HISTORY: ALIE BALDINGER 72 y.o. female returns for f/u of her iron deficiency anemia.    She was prescribed Ferrous Gloconate 324 mg po BID.  She tried to take this and was unable.  She is eating increasing iron rich foods.  Her most recent iron studies were slightly improved.  She tells me that she is feeling good on her current regimen of eating iron rich foods.  Of note she has a history of breast cancer from 2016.  She denies any issues with this today.   Patient Active Problem List   Diagnosis Date Noted   Iron deficiency anemia 10/23/2022   Seasonal allergic rhinitis due  to pollen 12/26/2017   Prediabetes 04/11/2017   Dislocated patella 02/21/2017   Trochanteric bursitis, right hip 09/24/2016   Myofascitis 09/24/2016   Post laminectomy syndrome 09/24/2016   Chronic pain syndrome 09/24/2016   Spondylosis without myelopathy or radiculopathy, lumbar region 07/30/2016   Anxiety and depression 01/02/2016   Hx of breast reconstruction 09/27/2015   Breast  cancer of upper-outer quadrant of left female breast (HCC) 09/09/2014   HTN (hypertension) 12/24/2012   GERD (gastroesophageal reflux disease) 12/24/2012   Hearing loss in left ear 12/24/2012   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    chemotherapy x 5 months   Anxiety    "sometimes" (09/23/2014)   Cancer of left breast (HCC) 08/2014   left upper outer   Cerebral meningioma (HCC) 09/2012   auditory; s/p excision   Chronic lower back pain    Chronic pain in left foot    Deafness in left ear    Facial droop    "left side; from brain tumor OR"   GERD (gastroesophageal reflux disease)    History of chemotherapy    last treatment 03/02/2015   History of hiatal hernia    Hypertension    Migraine    "before brain tumor was removed" (09/23/2014)   Personal history of chemotherapy 09/12/2014   PONV (postoperative nausea and vomiting)    "severe"   Stress incontinence     SURGICAL HISTORY: Past Surgical History:  Procedure Laterality Date   AUGMENTATION MAMMAPLASTY Bilateral 11/11/2015   BACK SURGERY     BRAIN SURGERY  2014   cerebellopontine angle tumor with craniotomy    BREAST BIOPSY Left 08/2014   BREAST RECONSTRUCTION Left 05/18/2015   BREAST RECONSTRUCTION WITH PLACEMENT OF TISSUE EXPANDER AND FLEX HD (ACELLULAR HYDRATED DERMIS) Left 05/18/2015   Procedure: LEFT BREAST RECONSTRUCTION WITH PLACEMENT OF TISSUE EXPANDER AND ACELLULAR HYDRATED DERMIS MATRIX;  Surgeon: Etter Sjogren, MD;  Location: [facility] OR;  Service: Plastics;  Laterality: Left;   COLONOSCOPY      FOOT FRACTURE SURGERY Left 1990's   FOOT SURGERY Left    "one the muscle; trying to roll foot inward after failed recovery S/P fracture"   FRACTURE SURGERY     JOINT REPLACEMENT     KNEE ARTHROSCOPY Left    MASTECTOMY COMPLETE / SIMPLE W/ SENTINEL NODE BIOPSY Left 09/23/2014   MULTIPLE TOOTH EXTRACTIONS     PARTIAL KNEE ARTHROPLASTY Left 2000's    Dr. Lajoyce Corners   PATELLAR TENDON REPAIR  05/27/2012   Procedure: PATELLA TENDON REPAIR;  Surgeon: Nadara Mustard, MD;  Location: [facility] OR;  Service: Orthopedics;  Laterality: Right;  Right Patella Tendon  Reconstruction with Semi Tendonosum   PORT-A-CATH REMOVAL Right 03/16/2015   Procedure: REMOVAL PORT-A-CATH;  Surgeon: Claud Kelp, MD;  Location: [facility] OR;  Service: General;  Laterality: Right;   PORTACATH PLACEMENT Right 09/23/2014   PORTACATH PLACEMENT Right 09/23/2014   Procedure: INSERTION PORT-A-CATH RIGHT SUBCLAVIAN;  Surgeon: Claud Kelp, MD;  Location: [facility] OR;  Service: General;  Laterality: Right;   POSTERIOR LUMBAR FUSION  02/1988   L2 reconstruction; fusion   SIMPLE MASTECTOMY WITH AXILLARY  SENTINEL NODE BIOPSY Left 09/23/2014   Procedure: LEFT TOTAL MASTECTOMY WITH LEFT AXILLARY SENTINEL LYMPH NODE BIOPSY;  Surgeon: Claud Kelp, MD;  Location: [facility] OR;  Service: General;  Laterality: Left;   TOTAL ABDOMINAL HYSTERECTOMY  12/1976   TOTAL KNEE ARTHROPLASTY Right 05/24/2009    Dr. Lajoyce Corners   UPPER GASTROINTESTINAL ENDOSCOPY     WISDOM TOOTH EXTRACTION      SOCIAL HISTORY: Social History   Socioeconomic History   Marital status: Married    Spouse name: Molly Maduro   Number of children: 0   Years of education: Not on file   Highest education level: Some college, no degree  Occupational History   Occupation: disability    Comment: HVAC  Tobacco Use   Smoking status: Former    Packs/day: 0.25    Years: 20.00    Additional pack years: 0.00    Total pack years: 5.00    Types: Cigarettes    Quit date: 05/22/1995    Years since quitting: 27.6    Smokeless tobacco: Never  Vaping Use   Vaping Use: Never used  Substance and Sexual Activity   Alcohol use: No   Drug use: No   Sexual activity: Yes    Partners: Male    Birth control/protection: None    Comment: menarche age 57, P 0 ,HRT x 42 yrs  Other Topics Concern   Not on file  Social History Narrative   Lives with her husband, 5 cats, 2 dogs and a variety of other pets. Raised a child, Renae Fickle, who is now grown.   Social Determinants of Health   Financial Resource Strain: Low Risk  (06/27/2017)   Overall Financial Resource Strain (CARDIA)    Difficulty of Paying Living Expenses: Not hard at all  Food Insecurity: No Food Insecurity (06/27/2017)   Hunger Vital Sign    Worried About Running Out of Food in the Last Year: Never true    Ran Out of Food in the Last Year: Never true  Transportation Needs: No Transportation Needs (06/27/2017)   PRAPARE - Administrator, Civil Service (Medical): No    Lack of Transportation (Non-Medical): No  Physical Activity: Inactive (06/27/2017)   Exercise Vital Sign    Days of Exercise per Week: 0 days    Minutes of Exercise per Session: 0 min  Stress: No Stress Concern Present (06/27/2017)   [employer] of Occupational Health - Occupational Stress Questionnaire    Feeling of Stress : Not at all  Social Connections: Somewhat Isolated (06/27/2017)   Social Connection and Isolation Panel [NHANES]    Frequency of Communication with Friends and Family: More than three times a week    Frequency of Social Gatherings with Friends and Family: More than three times a week    Attends Religious Services: Never    Database administrator or Organizations: No    Attends Banker Meetings: Never    Marital Status: Married  Catering manager Violence: Not At Risk (06/27/2017)   Humiliation, Afraid, Rape, and Kick questionnaire    Fear of Current or Ex-Partner: No    Emotionally Abused: No    Physically Abused: No    Sexually  Abused: No    FAMILY HISTORY: Family History  Adopted: Yes  Problem Relation Age of Onset   Hypertension Father     Review of Systems  Constitutional:  Negative for appetite change, chills, fatigue, fever and unexpected weight change.  HENT:   Negative for hearing  loss, lump/mass and trouble swallowing.   Eyes:  Negative for eye problems and icterus.  Respiratory:  Negative for chest tightness, cough and shortness of breath.   Cardiovascular:  Negative for chest pain, leg swelling and palpitations.  Gastrointestinal:  Negative for abdominal distention, abdominal pain, constipation, diarrhea, nausea and vomiting.  Endocrine: Negative for hot flashes.  Genitourinary:  Negative for difficulty urinating.   Musculoskeletal:  Negative for arthralgias.  Skin:  Negative for itching and rash.  Neurological:  Negative for dizziness, extremity weakness, headaches and numbness.  Hematological:  Negative for adenopathy. Does not bruise/bleed easily.  Psychiatric/Behavioral:  Negative for depression. The patient is not nervous/anxious.       PHYSICAL EXAMINATION Patient sounds well.  She is in no apparent distress.  Mood and behavior are normal.    LABORATORY DATA:  CBC    Component Value Date/Time   WBC 6.7 01/16/2023 1018   WBC 6.6 05/09/2015 1030   RBC 4.56 01/16/2023 1018   HGB 10.8 (L) 01/16/2023 1018   HGB 13.0 12/26/2017 0911   HGB 11.3 (L) 03/02/2015 0851   HCT 35.2 (L) 01/16/2023 1018   HCT 39.1 12/26/2017 0911   HCT 34.9 03/02/2015 0851   PLT 559 (H) 01/16/2023 1018   PLT 445 (H) 12/26/2017 0911   MCV 77.2 (L) 01/16/2023 1018   MCV 85 12/26/2017 0911   MCV 89.5 03/02/2015 0851   MCH 23.7 (L) 01/16/2023 1018   MCHC 30.7 01/16/2023 1018   RDW 18.5 (H) 01/16/2023 1018   RDW 14.8 12/26/2017 0911   RDW 13.5 03/02/2015 0851   LYMPHSABS 2.2 01/16/2023 1018   LYMPHSABS 2.7 12/26/2017 0911   LYMPHSABS 2.0 03/02/2015 0851   MONOABS 0.6 01/16/2023 1018   MONOABS 0.5  03/02/2015 0851   EOSABS 0.2 01/16/2023 1018   EOSABS 0.1 12/26/2017 0911   BASOSABS 0.1 01/16/2023 1018   BASOSABS 0.0 12/26/2017 0911   BASOSABS 0.1 03/02/2015 0851    CMP     Component Value Date/Time   NA 135 01/16/2023 1018   NA 138 12/26/2017 0911   NA 139 03/02/2015 0852   K 3.8 01/16/2023 1018   K 4.1 03/02/2015 0852   CL 102 01/16/2023 1018   CO2 27 01/16/2023 1018   CO2 27 03/02/2015 0852   GLUCOSE 123 (H) 01/16/2023 1018   GLUCOSE 121 03/02/2015 0852   BUN 7 (L) 01/16/2023 1018   BUN 10 12/26/2017 0911   BUN 4.6 (L) 03/02/2015 0852   CREATININE 0.65 01/16/2023 1018   CREATININE 0.7 03/02/2015 0852   CALCIUM 8.9 01/16/2023 1018   CALCIUM 8.9 03/02/2015 0852   PROT 6.6 01/16/2023 1018   PROT 6.2 12/26/2017 0911   PROT 6.1 (L) 03/02/2015 0852   ALBUMIN 4.0 01/16/2023 1018   ALBUMIN 4.2 12/26/2017 0911   ALBUMIN 3.9 03/02/2015 0852   AST 13 (L) 01/16/2023 1018   AST 17 03/02/2015 0852   ALT 8 01/16/2023 1018   ALT 14 03/02/2015 0852   ALKPHOS 43 01/16/2023 1018   ALKPHOS 49 03/02/2015 0852   BILITOT 0.3 01/16/2023 1018   BILITOT 0.30 03/02/2015 0852   GFRNONAA >60 01/16/2023 1018   GFRNONAA >89 12/30/2013 1354   GFRAA 106 12/26/2017 0911   GFRAA >89 12/30/2013 1354       ASSESSMENT and THERAPY PLAN:   Iron deficiency anemia [name] is a 71 year old woman with iron deficiency anemia likely related to malabsorption following up today after recent lab testing.  She has increased iron rich  foods that she has been unable to tolerate oral iron or IV iron.  Her iron levels are slowly responding to this.    [name] noted that she would prefer her PCP follow her iron deficiency anemia and that she will see [place] on an as-needed basis.  This is perfectly reasonable and we are happy to accommodate this.  I recommended continued iron rich foods.  RTC PRN.   Follow up instructions:    -Return to cancer center PRN     The patient was provided an opportunity to ask  questions and all were answered. The patient agreed with the plan and demonstrated an understanding of the instructions.   The patient was advised to call back or seek an in-person evaluation if the symptoms worsen or if the condition fails to improve as anticipated.   I provided 7 minutes of non face-to-face telephone visit time during this encounter, and > 50% was spent counseling as documented under my assessment & plan.  Lillard Anes, NP 01/20/23 11:47 AM Medical Oncology and Hematology [facility] [address] [place], [place] [address] Tel. [phone]    Fax. [phone]

## 2023-03-11 ENCOUNTER — Telehealth: Payer: Self-pay

## 2023-03-11 NOTE — Telephone Encounter (Signed)
Patient called in for repeat facet injections.Spoke with patient to schedule OV since last seen 06/2021. OV scheduled 03/12/23

## 2023-03-12 ENCOUNTER — Ambulatory Visit (INDEPENDENT_AMBULATORY_CARE_PROVIDER_SITE_OTHER): Payer: BC Managed Care – PPO | Admitting: Physical Medicine and Rehabilitation

## 2023-03-12 ENCOUNTER — Encounter: Payer: Self-pay | Admitting: Physical Medicine and Rehabilitation

## 2023-03-12 DIAGNOSIS — M545 Low back pain, unspecified: Secondary | ICD-10-CM | POA: Diagnosis not present

## 2023-03-12 DIAGNOSIS — M47816 Spondylosis without myelopathy or radiculopathy, lumbar region: Secondary | ICD-10-CM | POA: Diagnosis not present

## 2023-03-12 DIAGNOSIS — M961 Postlaminectomy syndrome, not elsewhere classified: Secondary | ICD-10-CM | POA: Diagnosis not present

## 2023-03-12 DIAGNOSIS — G8929 Other chronic pain: Secondary | ICD-10-CM | POA: Diagnosis not present

## 2023-03-12 NOTE — Progress Notes (Signed)
Functional Pain Scale - descriptive words and definitions  Distracting (5)    Aware of pain/able to complete some ADL's but limited by pain/sleep is affected and active distractions are only slightly useful. Moderate range order  Average Pain 8 when pain is bad  Lower back pain all the way across with no radiation in the legs. Has some pain in right hip

## 2023-03-12 NOTE — Progress Notes (Signed)
Amy [name] - 72 y.o. female MRN [id number]  Date of birth: [birth year]  Office Visit Note: Visit Date: 03/12/2023 PCP: Porfirio Oar, PA Referred by: Porfirio Oar, PA  Subjective: Chief Complaint  Patient presents with   Lower Back - Pain   HPI: Amy [name] is a 72 y.o. female who comes in today as a self referral for evaluation of chronic, worsening and severe bilateral lower back pain. Pain ongoing for several months, worsens with prolonged sitting, standing and laying flat. She describes pain as sore and aching sensation, currently rates as 8 out of 10. Some relief of pain with home exercise regimen, rest and use of medications. Lumbar x-rays from 2021 exhibits rods with laminar hooks, and wire fixation of the spinous processes, in the lumbar spine. Laminar looks are in the intralaminar spaces at T11-T12 and L4-L5. There is also grade 1 anterolisthesis of L4 on L5 and grade 2 anterolisthesis of L5 on S1. Prior burst fracture, underwent surgery with Dr. Vira Browns many years ago. Historically we have treated her for facet mediated pain below her fusion. She has undergone multiple facet joint injections in our office, most recent was bilateral L5-S1 facet joint injections in 2022. Per Dr. Pettibone Blas last note radiofrequency ablation remains an option. She reports significant relief of pain with these procedures. She underwent right total knee arthroplasty with Dr. Darnell Level with [facility] in 2023, reports she is doing well post surgery. Patient denies focal weakness, numbness and tingling. No recent trauma or falls.    Review of Systems  Musculoskeletal:  Positive for back pain.  Neurological:  Negative for tingling, sensory change, focal weakness and weakness.  All other systems reviewed and are negative.  Otherwise per HPI.  Assessment & Plan: Visit Diagnoses:    ICD-10-CM   1. Chronic bilateral low back pain without sciatica  M54.50 Ambulatory referral to  Physical Medicine Rehab   G89.29     2. Spondylosis without myelopathy or radiculopathy, lumbar region  M47.816 Ambulatory referral to Physical Medicine Rehab    3. Facet arthropathy, lumbar  M47.816 Ambulatory referral to Physical Medicine Rehab    4. Post laminectomy syndrome  M96.1 Ambulatory referral to Physical Medicine Rehab       Plan: Findings:  Chronic, worsening and severe bilateral lower back pain. Patient continues to have severe pain despite good conservative therapies such as home exercise regimen, rest and use of medications. Patients clinical presentation and exam are consistent with facet mediated pain. Pain noted with lumbar extension on exam today. There is moderate anterolisthesis of L5 on S1 (11 mm, grade 2). Next step is to perform diagnostic and hopefully therapeutic bilateral L5-S1 facet joint steroid injection under fluoroscopic guidance. If good relief of pain we discussed possibility of longer sustained pain relief with radiofrequency ablation. If her pain persists post injection we discussed possibility of obtaining lumbar MRI imaging. No red flag symptoms noted upon exam today.     Meds & Orders: No orders of the defined types were placed in this encounter.   Orders Placed This Encounter  Procedures   Ambulatory referral to Physical Medicine Rehab    Follow-up: Return for Bilateral L5-S1 intra-articular facet joint injections.   Procedures: No procedures performed      Clinical History: TECHNIQUE:  XR SPINE LUMBAR 2-3 VIEWS -   COMPARISON:  None   INDICATION: Low back pain    Exam date/time:  12/07/2019 2:55 PM   FINDINGS:  #  The patient  has rods with laminar hooks, and wire fixation of the spinous processes, in the lumbar spine. Laminar looks are in the intralaminar spaces at T11-T12 and L4-L5.  #  There aren't old central endplate depressions in T12, L1 and L2 (appearance may be partly due to parallax.  #  Spinal curvature convex to the right  lumbar spine and convex to the left at thoracolumbar region.  #  Mild anterolisthesis of L4 on L5 (grade 1, 5.1 mm.. Moderate anterolisthesis of L5 on S1 (11 mm, grade 2).  #  Osteopenia.    IMPRESSION:  1. Spinal curvature with spinal fixation with laminar hooks, rods and spinous process wires.  2.  Grade 1 anterolisthesis of L4 on L5 and grade 2 anterolisthesis of L5 on S1.  3.  Probable central endplate depressions at the thoracolumbar junction. See comments above.  4.  Osteopenia   Electronically Signed by: Neal Dy   She reports that she quit smoking about 27 years ago. Her smoking use included cigarettes. She started smoking about 47 years ago. She has a 5 pack-year smoking history. She has never used smokeless tobacco. No results for input(s): "HGBA1C", "LABURIC" in the last 8760 hours.  Objective:  VS:  HT:    WT:   BMI:     BP:   HR: bpm  TEMP: ( )  RESP:  Physical Exam Vitals and nursing note reviewed.  HENT:     Head: Normocephalic and atraumatic.     Right Ear: External ear normal.     Left Ear: External ear normal.     Nose: Nose normal.     Mouth/Throat:     Mouth: Mucous membranes are moist.  Eyes:     Extraocular Movements: Extraocular movements intact.  Cardiovascular:     Rate and Rhythm: Normal rate.     Pulses: Normal pulses.  Pulmonary:     Effort: Pulmonary effort is normal.  Abdominal:     General: Abdomen is flat. There is no distension.  Musculoskeletal:        General: Tenderness present.     Cervical back: Normal range of motion.     Comments: Patient rises from seated position to standing without difficulty. Pain noted with facet loading and lumbar extension. 5/5 strength noted with bilateral hip flexion, knee flexion/extension, ankle dorsiflexion/plantarflexion and EHL. No clonus noted bilaterally. No pain upon palpation of greater trochanters. No pain with internal/external rotation of bilateral hips. Sensation intact bilaterally.  Negative slump test bilaterally. Ambulates without aid, gait steady.     Skin:    General: Skin is warm and dry.     Capillary Refill: Capillary refill takes less than 2 seconds.  Neurological:     General: No focal deficit present.     Mental Status: She is alert and oriented to person, place, and time.  Psychiatric:        Mood and Affect: Mood normal.        Behavior: Behavior normal.     Ortho Exam  Imaging: No results found.  Past Medical/Family/Surgical/Social History: Medications & Allergies reviewed per EMR, new medications updated. Patient Active Problem List   Diagnosis Date Noted   Iron deficiency anemia 10/23/2022   Seasonal allergic rhinitis due to pollen 12/26/2017   Prediabetes 04/11/2017   Dislocated patella 02/21/2017   Trochanteric bursitis, right hip 09/24/2016   Myofascitis 09/24/2016   Post laminectomy syndrome 09/24/2016   Chronic pain syndrome 09/24/2016   Spondylosis without myelopathy or radiculopathy, lumbar region  07/30/2016   Anxiety and depression 01/02/2016   Hx of breast reconstruction 09/27/2015   Breast cancer of upper-outer quadrant of left female breast (HCC) 09/09/2014   HTN (hypertension) 12/24/2012   GERD (gastroesophageal reflux disease) 12/24/2012   Hearing loss in left ear 12/24/2012   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   Insomnia 12/24/2012   Diverticulosis 12/24/2012   Personal history of colonic polyps 12/24/2012   Hyperlipidemia 12/24/2012   BCC (basal cell carcinoma), arm 12/24/2012   Past Medical History:  Diagnosis Date   Allergy    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    chemotherapy x 5 months   Anxiety    "sometimes" (09/23/2014)   Cancer of left breast (HCC) 08/2014   left upper outer   Cerebral meningioma (HCC) 09/2012   auditory; s/p excision   Chronic lower back pain    Chronic pain in left foot    Deafness in left ear    Facial droop    "left side; from brain tumor OR"   GERD (gastroesophageal reflux disease)    History  of chemotherapy    last treatment 03/02/2015   History of hiatal hernia    Hypertension    Migraine    "before brain tumor was removed" (09/23/2014)   Personal history of chemotherapy 09/12/2014   PONV (postoperative nausea and vomiting)    "severe"   Stress incontinence    Family History  Adopted: Yes  Problem Relation Age of Onset   Hypertension Father    Past Surgical History:  Procedure Laterality Date   AUGMENTATION MAMMAPLASTY Bilateral 11/11/2015   BACK SURGERY     BRAIN SURGERY  2014   cerebellopontine angle tumor with craniotomy    BREAST BIOPSY Left 08/2014   BREAST RECONSTRUCTION Left 05/18/2015   BREAST RECONSTRUCTION WITH PLACEMENT OF TISSUE EXPANDER AND FLEX HD (ACELLULAR HYDRATED DERMIS) Left 05/18/2015   Procedure: LEFT BREAST RECONSTRUCTION WITH PLACEMENT OF TISSUE EXPANDER AND ACELLULAR HYDRATED DERMIS MATRIX;  Surgeon: Etter Sjogren, MD;  Location: [facility] OR;  Service: Plastics;  Laterality: Left;   COLONOSCOPY     FOOT FRACTURE SURGERY Left 1990's   FOOT SURGERY Left    "one the muscle; trying to roll foot inward after failed recovery S/P fracture"   FRACTURE SURGERY     JOINT REPLACEMENT     KNEE ARTHROSCOPY Left    MASTECTOMY COMPLETE / SIMPLE W/ SENTINEL NODE BIOPSY Left 09/23/2014   MULTIPLE TOOTH EXTRACTIONS     PARTIAL KNEE ARTHROPLASTY Left 2000's    Dr. Lajoyce Corners   PATELLAR TENDON REPAIR  05/27/2012   Procedure: PATELLA TENDON REPAIR;  Surgeon: Nadara Mustard, MD;  Location: [facility] OR;  Service: Orthopedics;  Laterality: Right;  Right Patella Tendon  Reconstruction with Semi Tendonosum   PORT-A-CATH REMOVAL Right 03/16/2015   Procedure: REMOVAL PORT-A-CATH;  Surgeon: Claud Kelp, MD;  Location: [facility];  Service: General;  Laterality: Right;   PORTACATH PLACEMENT Right 09/23/2014   PORTACATH PLACEMENT Right 09/23/2014   Procedure: INSERTION PORT-A-CATH RIGHT SUBCLAVIAN;  Surgeon: Claud Kelp, MD;  Location: [facility] OR;  Service: General;  Laterality: Right;   POSTERIOR  LUMBAR FUSION  02/1988   L2 reconstruction; fusion   SIMPLE MASTECTOMY WITH AXILLARY SENTINEL NODE BIOPSY Left 09/23/2014   Procedure: LEFT TOTAL MASTECTOMY WITH LEFT AXILLARY SENTINEL LYMPH NODE BIOPSY;  Surgeon: Claud Kelp, MD;  Location: [facility] OR;  Service: General;  Laterality: Left;   TOTAL ABDOMINAL HYSTERECTOMY  12/1976   TOTAL KNEE ARTHROPLASTY Right 05/24/2009  Dr. Lajoyce Corners   UPPER GASTROINTESTINAL ENDOSCOPY     WISDOM TOOTH EXTRACTION     Social History   Occupational History   Occupation: disability    Comment: HVAC  Tobacco Use   Smoking status: Former    Current packs/day: 0.00    Average packs/day: 0.3 packs/day for 20.0 years (5.0 ttl pk-yrs)    Types: Cigarettes    Start date: 05/22/1975    Quit date: 05/22/1995    Years since quitting: 27.8   Smokeless tobacco: Never  Vaping Use   Vaping status: Never Used  Substance and Sexual Activity   Alcohol use: No   Drug use: No   Sexual activity: Yes    Partners: Male    Birth control/protection: None    Comment: menarche age 51, P 0 ,HRT x 42 yrs

## 2023-04-01 ENCOUNTER — Encounter: Payer: Self-pay | Admitting: Physical Medicine and Rehabilitation

## 2023-04-01 ENCOUNTER — Other Ambulatory Visit: Payer: Self-pay

## 2023-04-01 ENCOUNTER — Ambulatory Visit: Payer: BC Managed Care – PPO | Admitting: Physical Medicine and Rehabilitation

## 2023-04-01 VITALS — BP 127/84 | HR 94

## 2023-04-01 DIAGNOSIS — M47816 Spondylosis without myelopathy or radiculopathy, lumbar region: Secondary | ICD-10-CM | POA: Diagnosis not present

## 2023-04-01 DIAGNOSIS — G894 Chronic pain syndrome: Secondary | ICD-10-CM

## 2023-04-01 DIAGNOSIS — M961 Postlaminectomy syndrome, not elsewhere classified: Secondary | ICD-10-CM

## 2023-04-01 MED ORDER — METHYLPREDNISOLONE ACETATE 80 MG/ML IJ SUSP
80.0000 mg | Freq: Once | INTRAMUSCULAR | Status: AC
Start: 2023-04-01 — End: 2023-04-01
  Administered 2023-04-01: 80 mg

## 2023-04-01 NOTE — Progress Notes (Signed)
Functional Pain Scale - descriptive words and definitions  Moderate (4)   Constantly aware of pain, can complete ADLs with modification/sleep marginally affected at times/passive distraction is of no use, but active distraction gives some relief. Moderate range order  Average Pain 5-6    +Driver, -BT, -Dye Allergies.  Lower back pain in both sides with some radiation in the right hip

## 2023-04-01 NOTE — Patient Instructions (Signed)

## 2023-04-10 ENCOUNTER — Other Ambulatory Visit: Payer: Self-pay | Admitting: Physician Assistant

## 2023-04-10 DIAGNOSIS — Z0000 Encounter for general adult medical examination without abnormal findings: Secondary | ICD-10-CM

## 2023-04-12 NOTE — Progress Notes (Signed)
Amy [name] - 72 y.o. female MRN [id number]  Date of birth: [birth year]  Office Visit Note: Visit Date: 04/01/2023 PCP: Porfirio Oar, PA Referred by: Porfirio Oar, PA  Subjective: Chief Complaint  Patient presents with   Lower Back - Pain   HPI:  Amy [name] is a 72 y.o. female who comes in today at the request of Ellin Goodie, FNP for planned Bilateral  L5-S1 Lumbar facet/medial branch block with fluoroscopic guidance.  The patient has failed conservative care including home exercise, medications, time and activity modification.  This injection will be diagnostic and hopefully therapeutic.  Please see requesting physician notes for further details and justification.  Exam has shown concordant pain with facet joint loading.   ROS Otherwise per HPI.  Assessment & Plan: Visit Diagnoses:    ICD-10-CM   1. Spondylosis without myelopathy or radiculopathy, lumbar region  M47.816 XR C-ARM NO REPORT    Facet Injection    methylPREDNISolone acetate (DEPO-MEDROL) injection 80 mg    2. Post laminectomy syndrome  M96.1     3. Chronic pain syndrome  G89.4       Plan: No additional findings.   Meds & Orders:  Meds ordered this encounter  Medications   methylPREDNISolone acetate (DEPO-MEDROL) injection 80 mg    Orders Placed This Encounter  Procedures   Facet Injection   XR C-ARM NO REPORT    Follow-up: Return for visit to requesting provider as needed.   Procedures: No procedures performed  Lumbar Facet Joint Intra-Articular Injection(s) with Fluoroscopic Guidance  Patient: Amy [name]      Date of Birth: 16-Aug-1950 MRN: [id number] PCP: Porfirio Oar, PA      Visit Date: 04/01/2023   Universal Protocol:    Date/Time: 04/01/2023  Consent Given By: the patient  Position: PRONE   Additional Comments: Vital signs were monitored before and after the procedure. Patient was prepped and draped in the usual sterile fashion. The correct patient,  procedure, and site was verified.   Injection Procedure Details:  Procedure Site One Meds Administered:  Meds ordered this encounter  Medications   methylPREDNISolone acetate (DEPO-MEDROL) injection 80 mg     Laterality: Bilateral  Location/Site:  L5-S1  Needle size: 22 guage  Needle type: Spinal  Needle Placement: Articular  Findings:  -Comments: Excellent flow of contrast producing a partial arthrogram.  Procedure Details: The fluoroscope beam is vertically oriented in AP, and the inferior recess is visualized beneath the lower pole of the inferior apophyseal process, which represents the target point for needle insertion. When direct visualization is difficult the target point is located at the medial projection of the vertebral pedicle. The region overlying each aforementioned target is locally anesthetized with a 1 to 2 ml. volume of 1% Lidocaine without Epinephrine.   The spinal needle was inserted into each of the above mentioned facet joints using biplanar fluoroscopic guidance. A 0.25 to 0.5 ml. volume of Isovue-250 was injected and a partial facet joint arthrogram was obtained. A single spot film was obtained of the resulting arthrogram.    One to 1.25 ml of the steroid/anesthetic solution was then injected into each of the facet joints noted above.   Additional Comments:  No complications occurred Dressing: 2 x 2 sterile gauze and Band-Aid    Post-procedure details: Patient was observed during the procedure. Post-procedure instructions were reviewed.  Patient left the clinic in stable condition.    Clinical History: TECHNIQUE:  XR SPINE LUMBAR 2-3 VIEWS -  COMPARISON:  None   INDICATION: Low back pain    Exam date/time:  12/07/2019 2:55 PM   FINDINGS:  #  The patient has rods with laminar hooks, and wire fixation of the spinous processes, in the lumbar spine. Laminar looks are in the intralaminar spaces at T11-T12 and L4-L5.  #  There aren't old  central endplate depressions in T12, L1 and L2 (appearance may be partly due to parallax.  #  Spinal curvature convex to the right lumbar spine and convex to the left at thoracolumbar region.  #  Mild anterolisthesis of L4 on L5 (grade 1, 5.1 mm.. Moderate anterolisthesis of L5 on S1 (11 mm, grade 2).  #  Osteopenia.    IMPRESSION:  1. Spinal curvature with spinal fixation with laminar hooks, rods and spinous process wires.  2.  Grade 1 anterolisthesis of L4 on L5 and grade 2 anterolisthesis of L5 on S1.  3.  Probable central endplate depressions at the thoracolumbar junction. See comments above.  4.  Osteopenia   Electronically Signed by: Neal Dy     Objective:  VS:  HT:    WT:   BMI:     BP:127/84  HR:94bpm  TEMP: ( )  RESP:  Physical Exam Vitals and nursing note reviewed.  Constitutional:      General: She is not in acute distress.    Appearance: Normal appearance. She is not ill-appearing.  HENT:     Head: Normocephalic and atraumatic.     Right Ear: External ear normal.     Left Ear: External ear normal.  Eyes:     Extraocular Movements: Extraocular movements intact.  Cardiovascular:     Rate and Rhythm: Normal rate.     Pulses: Normal pulses.  Pulmonary:     Effort: Pulmonary effort is normal. No respiratory distress.  Abdominal:     General: There is no distension.     Palpations: Abdomen is soft.  Musculoskeletal:        General: Tenderness present.     Cervical back: Neck supple.     Right lower leg: No edema.     Left lower leg: No edema.     Comments: Patient has good distal strength with no pain over the greater trochanters.  No clonus or focal weakness.  Skin:    Findings: No erythema, lesion or rash.  Neurological:     General: No focal deficit present.     Mental Status: She is alert and oriented to person, place, and time.     Sensory: No sensory deficit.     Motor: No weakness or abnormal muscle tone.     Coordination: Coordination  normal.  Psychiatric:        Mood and Affect: Mood normal.        Behavior: Behavior normal.      Imaging: No results found.

## 2023-04-12 NOTE — Procedures (Signed)
Lumbar Facet Joint Intra-Articular Injection(s) with Fluoroscopic Guidance  Patient: Amy [name]      Date of Birth: 1950/08/13 MRN: [id number] PCP: Porfirio Oar, PA      Visit Date: 04/01/2023   Universal Protocol:    Date/Time: 04/01/2023  Consent Given By: the patient  Position: PRONE   Additional Comments: Vital signs were monitored before and after the procedure. Patient was prepped and draped in the usual sterile fashion. The correct patient, procedure, and site was verified.   Injection Procedure Details:  Procedure Site One Meds Administered:  Meds ordered this encounter  Medications   methylPREDNISolone acetate (DEPO-MEDROL) injection 80 mg     Laterality: Bilateral  Location/Site:  L5-S1  Needle size: 22 guage  Needle type: Spinal  Needle Placement: Articular  Findings:  -Comments: Excellent flow of contrast producing a partial arthrogram.  Procedure Details: The fluoroscope beam is vertically oriented in AP, and the inferior recess is visualized beneath the lower pole of the inferior apophyseal process, which represents the target point for needle insertion. When direct visualization is difficult the target point is located at the medial projection of the vertebral pedicle. The region overlying each aforementioned target is locally anesthetized with a 1 to 2 ml. volume of 1% Lidocaine without Epinephrine.   The spinal needle was inserted into each of the above mentioned facet joints using biplanar fluoroscopic guidance. A 0.25 to 0.5 ml. volume of Isovue-250 was injected and a partial facet joint arthrogram was obtained. A single spot film was obtained of the resulting arthrogram.    One to 1.25 ml of the steroid/anesthetic solution was then injected into each of the facet joints noted above.   Additional Comments:  No complications occurred Dressing: 2 x 2 sterile gauze and Band-Aid    Post-procedure details: Patient was observed during the  procedure. Post-procedure instructions were reviewed.  Patient left the clinic in stable condition.

## 2023-05-05 ENCOUNTER — Ambulatory Visit: Payer: BC Managed Care – PPO

## 2023-05-27 ENCOUNTER — Ambulatory Visit: Payer: BC Managed Care – PPO

## 2023-06-03 ENCOUNTER — Ambulatory Visit
Admission: RE | Admit: 2023-06-03 | Discharge: 2023-06-03 | Disposition: A | Discharge status: Home / Self Care | Payer: BC Managed Care – PPO | Source: Ambulatory Visit | Attending: Physician Assistant | Admitting: Physician Assistant

## 2023-06-03 DIAGNOSIS — Z0000 Encounter for general adult medical examination without abnormal findings: Secondary | ICD-10-CM

## 2023-06-05 ENCOUNTER — Other Ambulatory Visit: Payer: Self-pay | Admitting: Physician Assistant

## 2023-06-05 DIAGNOSIS — N63 Unspecified lump in unspecified breast: Secondary | ICD-10-CM

## 2023-06-06 ENCOUNTER — Other Ambulatory Visit: Payer: Self-pay | Admitting: Physician Assistant

## 2023-06-06 DIAGNOSIS — R928 Other abnormal and inconclusive findings on diagnostic imaging of breast: Secondary | ICD-10-CM

## 2023-06-11 ENCOUNTER — Ambulatory Visit
Admission: RE | Admit: 2023-06-11 | Discharge: 2023-06-11 | Disposition: A | Discharge status: Home / Self Care | Payer: BC Managed Care – PPO | Source: Ambulatory Visit | Attending: Physician Assistant | Admitting: Physician Assistant

## 2023-06-11 ENCOUNTER — Ambulatory Visit
Admission: RE | Admit: 2023-06-11 | Discharge: 2023-06-11 | Disposition: A | Discharge status: Home / Self Care | Payer: Medicare Other | Source: Ambulatory Visit | Attending: Physician Assistant | Admitting: Physician Assistant

## 2023-06-11 ENCOUNTER — Other Ambulatory Visit: Payer: Self-pay | Admitting: Physician Assistant

## 2023-06-11 DIAGNOSIS — N63 Unspecified lump in unspecified breast: Secondary | ICD-10-CM

## 2023-06-11 DIAGNOSIS — N6489 Other specified disorders of breast: Secondary | ICD-10-CM

## 2023-06-11 DIAGNOSIS — R928 Other abnormal and inconclusive findings on diagnostic imaging of breast: Secondary | ICD-10-CM

## 2023-06-16 ENCOUNTER — Other Ambulatory Visit: Payer: Self-pay | Admitting: Physician Assistant

## 2023-06-16 ENCOUNTER — Ambulatory Visit
Admission: RE | Admit: 2023-06-16 | Discharge: 2023-06-16 | Disposition: A | Discharge status: Home / Self Care | Payer: BC Managed Care – PPO | Source: Ambulatory Visit | Attending: Physician Assistant | Admitting: Physician Assistant

## 2023-06-16 DIAGNOSIS — N6489 Other specified disorders of breast: Secondary | ICD-10-CM

## 2023-06-24 ENCOUNTER — Other Ambulatory Visit: Payer: BC Managed Care – PPO

## 2023-12-15 ENCOUNTER — Other Ambulatory Visit: Payer: Self-pay | Admitting: Physician Assistant

## 2023-12-15 DIAGNOSIS — N6489 Other specified disorders of breast: Secondary | ICD-10-CM

## 2024-06-24 ENCOUNTER — Ambulatory Visit
Admission: RE | Admit: 2024-06-24 | Discharge: 2024-06-24 | Disposition: A | Source: Ambulatory Visit | Attending: Physician Assistant | Admitting: Physician Assistant

## 2024-06-24 DIAGNOSIS — N6489 Other specified disorders of breast: Secondary | ICD-10-CM
